# Patient Record
Sex: Male | Born: 1965 | Race: Black or African American | Hispanic: No | Marital: Single | Smoking: Former smoker
Health system: Southern US, Community
[De-identification: ages and names within clinical notes are randomized; demographics above are authoritative.]

## PROBLEM LIST (undated history)

## (undated) DIAGNOSIS — E119 Type 2 diabetes mellitus without complications: Secondary | ICD-10-CM

## (undated) DIAGNOSIS — N189 Chronic kidney disease, unspecified: Secondary | ICD-10-CM

## (undated) DIAGNOSIS — E785 Hyperlipidemia, unspecified: Secondary | ICD-10-CM

## (undated) DIAGNOSIS — N186 End stage renal disease: Secondary | ICD-10-CM

## (undated) HISTORY — DX: Chronic kidney disease, unspecified: N18.9

## (undated) HISTORY — DX: Hyperlipidemia, unspecified: E78.5

## (undated) HISTORY — DX: Type 2 diabetes mellitus without complications: E11.9

## (undated) HISTORY — DX: End stage renal disease: N18.6

---

## 2018-07-26 DIAGNOSIS — I480 Paroxysmal atrial fibrillation: Secondary | ICD-10-CM | POA: Insufficient documentation

## 2018-07-26 DIAGNOSIS — I1310 Hypertensive heart and chronic kidney disease without heart failure, with stage 1 through stage 4 chronic kidney disease, or unspecified chronic kidney disease: Secondary | ICD-10-CM | POA: Insufficient documentation

## 2018-07-26 DIAGNOSIS — E782 Mixed hyperlipidemia: Secondary | ICD-10-CM | POA: Insufficient documentation

## 2018-07-26 DIAGNOSIS — I5032 Chronic diastolic (congestive) heart failure: Secondary | ICD-10-CM | POA: Insufficient documentation

## 2018-07-30 DIAGNOSIS — N184 Chronic kidney disease, stage 4 (severe): Secondary | ICD-10-CM | POA: Insufficient documentation

## 2018-07-30 DIAGNOSIS — I517 Cardiomegaly: Secondary | ICD-10-CM | POA: Insufficient documentation

## 2018-07-30 DIAGNOSIS — N289 Disorder of kidney and ureter, unspecified: Secondary | ICD-10-CM | POA: Insufficient documentation

## 2018-07-30 DIAGNOSIS — I10 Essential (primary) hypertension: Secondary | ICD-10-CM | POA: Insufficient documentation

## 2018-07-30 DIAGNOSIS — R809 Proteinuria, unspecified: Secondary | ICD-10-CM | POA: Insufficient documentation

## 2018-07-30 DIAGNOSIS — I38 Endocarditis, valve unspecified: Secondary | ICD-10-CM | POA: Insufficient documentation

## 2018-09-11 DIAGNOSIS — B182 Chronic viral hepatitis C: Secondary | ICD-10-CM | POA: Insufficient documentation

## 2018-09-11 DIAGNOSIS — R768 Other specified abnormal immunological findings in serum: Secondary | ICD-10-CM | POA: Insufficient documentation

## 2019-02-03 DIAGNOSIS — E1129 Type 2 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: Secondary | ICD-10-CM | POA: Insufficient documentation

## 2019-02-03 DIAGNOSIS — N186 End stage renal disease: Secondary | ICD-10-CM | POA: Insufficient documentation

## 2019-02-03 DIAGNOSIS — T7840XA Allergy, unspecified, initial encounter: Secondary | ICD-10-CM | POA: Insufficient documentation

## 2019-02-03 DIAGNOSIS — N2581 Secondary hyperparathyroidism of renal origin: Secondary | ICD-10-CM | POA: Insufficient documentation

## 2019-02-03 DIAGNOSIS — D689 Coagulation defect, unspecified: Secondary | ICD-10-CM | POA: Insufficient documentation

## 2019-02-15 DIAGNOSIS — R52 Pain, unspecified: Secondary | ICD-10-CM | POA: Insufficient documentation

## 2019-03-25 DIAGNOSIS — R197 Diarrhea, unspecified: Secondary | ICD-10-CM | POA: Insufficient documentation

## 2019-04-07 ENCOUNTER — Other Ambulatory Visit: Payer: Self-pay

## 2019-04-09 ENCOUNTER — Other Ambulatory Visit: Payer: Self-pay

## 2019-04-09 ENCOUNTER — Ambulatory Visit (INDEPENDENT_AMBULATORY_CARE_PROVIDER_SITE_OTHER): Payer: Medicare Other | Admitting: Endocrinology

## 2019-04-09 ENCOUNTER — Encounter: Payer: Self-pay | Admitting: Endocrinology

## 2019-04-09 VITALS — BP 110/70 | HR 88 | Ht 69.0 in | Wt 252.0 lb

## 2019-04-09 DIAGNOSIS — E119 Type 2 diabetes mellitus without complications: Secondary | ICD-10-CM | POA: Diagnosis not present

## 2019-04-09 DIAGNOSIS — Z794 Long term (current) use of insulin: Secondary | ICD-10-CM

## 2019-04-09 DIAGNOSIS — N186 End stage renal disease: Secondary | ICD-10-CM

## 2019-04-09 DIAGNOSIS — E1122 Type 2 diabetes mellitus with diabetic chronic kidney disease: Secondary | ICD-10-CM | POA: Diagnosis not present

## 2019-04-09 DIAGNOSIS — Z992 Dependence on renal dialysis: Secondary | ICD-10-CM

## 2019-04-09 DIAGNOSIS — Z89429 Acquired absence of other toe(s), unspecified side: Secondary | ICD-10-CM

## 2019-04-09 DIAGNOSIS — E785 Hyperlipidemia, unspecified: Secondary | ICD-10-CM

## 2019-04-09 LAB — POCT GLYCOSYLATED HEMOGLOBIN (HGB A1C): Hemoglobin A1C: 8.8 % — AB (ref 4.0–5.6)

## 2019-04-09 MED ORDER — FREESTYLE LIBRE 14 DAY SENSOR MISC: 1.0000 | 3 refills | Status: DC

## 2019-04-09 MED ORDER — BASAGLAR KWIKPEN 100 UNIT/ML ~~LOC~~ SOPN: 60.0000 [IU] | PEN_INJECTOR | Freq: Every day | SUBCUTANEOUS | 3 refills | Status: DC

## 2019-04-09 MED ORDER — INSULIN LISPRO (1 UNIT DIAL) 100 UNIT/ML (KWIKPEN): 90.0000 [IU] | PEN_INJECTOR | Freq: Two times a day (BID) | SUBCUTANEOUS | 3 refills | Status: DC

## 2019-04-09 MED ORDER — PEN NEEDLES 31G X 8 MM MISC: 1.0000 | Freq: Four times a day (QID) | 3 refills | Status: DC

## 2019-04-09 NOTE — Patient Instructions (Addendum)
good diet and exercise significantly improve the control of your diabetes.  please let me know if you wish to be referred to a dietician.  high blood sugar is very risky to your health.  you should see an eye doctor and dentist every year.  It is very important to get all recommended vaccinations.  Controlling your blood pressure and cholesterol drastically reduces the damage diabetes does to your body.  Those who smoke should quit.  Please discuss these with your doctor.  check your blood sugar twice a day.  vary the time of day when you check, between before the 3 meals, and at bedtime.  also check if you have symptoms of your blood sugar being too high or too low.  please keep a record of the readings and bring it to your next appointment here (or you can bring the meter itself).  You can write it on any piece of paper.  please call us sooner if your blood sugar goes below 70, or if you have a lot of readings over 200. Please change the insulins to the numbers listed below (you would not take the humalog at bedtime). Please come back for a follow-up appointment in 2 weeks.

## 2019-04-09 NOTE — Progress Notes (Signed)
Subjective:    Patient ID: Darryl Kelley, male    DOB: 05/10/1965, 54 y.o.   MRN: [id number]  HPI pt is referred by Dr Deterding, for diabetes.  Pt states DM was dx'ed in 1660; it is complicated by ESRD, toe amputation, and PN; he has been on insulin since 2006; pt says his diet and exercise are poor; he has never had pancreatitis, pancreatic surgery, or DKA.  Last episode of severe hypoglycemia was in 2015.  He eats 2 meals per day (10 AM and 7 PM).  He seldom has hypoglycemia, and these episodes are mild.  We downloaded continuous glucose monitor.  Glucose varies from 70-320.  It is in general lowest fasting and in the afternoon.  He takes humalog 60 units twice a day (just before each meal, and at HS), and basaglar 60/d.   Past Medical History:  Diagnosis Date  . Diabetes ([place])   . Dyslipidemia      Social History   Socioeconomic History  . Marital status: Single    Spouse name: Not on file  . Number of children: Not on file  . Years of education: Not on file  . Highest education level: Not on file  Occupational History  . Not on file  Tobacco Use  . Smoking status: Former Research scientist (life sciences)  . Smokeless tobacco: Never Used  Substance and Sexual Activity  . Alcohol use: Never  . Drug use: Never  . Sexual activity: Not Currently  Other Topics Concern  . Not on file  Social History Narrative  . Not on file   Social Determinants of Health   Financial Resource Strain:   . Difficulty of Paying Living Expenses:   Food Insecurity:   . Worried About Charity fundraiser in the Last Year:   . Arboriculturist in the Last Year:   Transportation Needs:   . Film/video editor (Medical):   Marland Kitchen Lack of Transportation (Non-Medical):   Physical Activity:   . Days of Exercise per Week:   . Minutes of Exercise per Session:   Stress:   . Feeling of Stress :   Social Connections:   . Frequency of Communication with Friends and Family:   . Frequency of Social Gatherings with Friends and  Family:   . Attends Religious Services:   . Active Member of Clubs or Organizations:   . Attends Archivist Meetings:   Marland Kitchen Marital Status:   Intimate Partner Violence:   . Fear of Current or Ex-Partner:   . Emotionally Abused:   Marland Kitchen Physically Abused:   . Sexually Abused:     Current Outpatient Medications on File Prior to Visit  Medication Sig Dispense Refill  . acetaminophen (TYLENOL) 650 MG CR tablet Take 650 mg by mouth as needed for pain.    Marland Kitchen aspirin 81 MG EC tablet Take 81 mg by mouth daily.     . carvedilol (COREG) 25 MG tablet Take 12.5 mg by mouth 2 (two) times daily with a meal.     . clopidogrel (PLAVIX) 75 MG tablet Take 75 mg by mouth daily.     Marland Kitchen doxycycline (VIBRA-TABS) 100 MG tablet Take 100 mg by mouth 2 (two) times daily.     Marland Kitchen ezetimibe (ZETIA) 10 MG tablet Take 10 mg by mouth daily.     . heparin 1000 unit/mL SOLN injection Heparin Sodium (Porcine) 1,000 Units/mL Systemic    . LOPERAMIDE HCL PO Take 2 mg by mouth as needed.     Marland Kitchen  metoCLOPramide (REGLAN) 5 MG tablet Take 5 mg by mouth 3 (three) times daily as needed.     Marland Kitchen omeprazole (PRILOSEC) 20 MG capsule Take 20 mg by mouth daily.     . pregabalin (LYRICA) 150 MG capsule Take 150 mg by mouth 2 (two) times daily.     . rosuvastatin (CRESTOR) 40 MG tablet Take 40 mg by mouth daily.     . sevelamer carbonate (RENVELA) 800 MG tablet Take 1,600 mg by mouth 3 (three) times daily with meals.     Marland Kitchen VITAMIN D PO Take 0.75 mcg by mouth 3 (three) times a week.      No current facility-administered medications on file prior to visit.    Allergies  Allergen Reactions  . Atorvastatin Hives    Family History  Problem Relation Age of Onset  . Diabetes Neg Hx     BP 110/70   Pulse 88   Ht 5\' 9"  (1.753 m)   Wt 252 lb (114.3 kg)   SpO2 96%   BMI 37.21 kg/m    Review of Systems denies blurry vision, chest pain, n/v, memory loss, and depression.  He has fatigue, doe, and weight gain.      Objective:    Physical Exam VS: see vs page GEN: no distress HEAD: head: no deformity eyes: no periorbital swelling, no proptosis external nose and ears are normal NECK: supple, thyroid is not enlarged CHEST WALL: no deformity LUNGS: clear to auscultation CV: reg rate and rhythm, no murmur MUSCULOSKELETAL: muscle bulk and strength are grossly normal.  no obvious joint swelling.  gait is normal and steady.  Left 3rd toe is surgically absent.    EXTEMITIES: no deformity.  no ulcer on the feet.  feet are of normal color and temp.  trace bilat leg edema.   PULSES: dorsalis pedis intact bilat.  no carotid bruit NEURO:  cn 2-12 grossly intact.   readily moves all 4's.  sensation is intact to touch on the feet, but decreased from normal.   SKIN:  Normal texture and temperature.  No rash or suspicious lesion is visible.   NODES:  None palpable at the neck PSYCH: alert, well-oriented.  Does not appear anxious nor depressed.   Lab Results  Component Value Date   HGBA1C 8.8 (A) 04/09/2019   I have reviewed outside records, and summarized: Pt was noted to have elevated a1c, and referred here.  It was noted that pt was stable on HD.  He goes MWF afternoons     Assessment & Plan:  Insulin-requiring type 2 DM, on HD. He needs increased rx. The pattern of his cbg's indicates he needs some adjustment in his therapy    Patient Instructions  good diet and exercise significantly improve the control of your diabetes.  please let me know if you wish to be referred to a dietician.  high blood sugar is very risky to your health.  you should see an eye doctor and dentist every year.  It is very important to get all recommended vaccinations.  Controlling your blood pressure and cholesterol drastically reduces the damage diabetes does to your body.  Those who smoke should quit.  Please discuss these with your doctor.  check your blood sugar twice a day.  vary the time of day when you check, between before the 3 meals, and  at bedtime.  also check if you have symptoms of your blood sugar being too high or too low.  please keep a record  of the readings and bring it to your next appointment here (or you can bring the meter itself).  You can write it on any piece of paper.  please call us sooner if your blood sugar goes below 70, or if you have a lot of readings over 200. Please change the insulins to the numbers listed below (you would not take the humalog at bedtime). Please come back for a follow-up appointment in 2 weeks.

## 2019-04-11 ENCOUNTER — Ambulatory Visit: Payer: Medicare Other | Admitting: Family Medicine

## 2019-04-12 ENCOUNTER — Encounter: Payer: Self-pay | Admitting: Endocrinology

## 2019-04-12 DIAGNOSIS — E785 Hyperlipidemia, unspecified: Secondary | ICD-10-CM | POA: Insufficient documentation

## 2019-04-16 ENCOUNTER — Other Ambulatory Visit: Payer: Self-pay

## 2019-04-16 ENCOUNTER — Encounter: Payer: Self-pay | Admitting: Family Medicine

## 2019-04-16 ENCOUNTER — Ambulatory Visit (INDEPENDENT_AMBULATORY_CARE_PROVIDER_SITE_OTHER): Payer: Medicare Other | Admitting: Family Medicine

## 2019-04-16 VITALS — BP 112/60 | HR 81 | Wt 251.0 lb

## 2019-04-16 DIAGNOSIS — R61 Generalized hyperhidrosis: Secondary | ICD-10-CM

## 2019-04-16 DIAGNOSIS — E785 Hyperlipidemia, unspecified: Secondary | ICD-10-CM

## 2019-04-16 DIAGNOSIS — Z7689 Persons encountering health services in other specified circumstances: Secondary | ICD-10-CM | POA: Diagnosis not present

## 2019-04-16 DIAGNOSIS — E1129 Type 2 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: Secondary | ICD-10-CM | POA: Diagnosis not present

## 2019-04-16 MED ORDER — BASAGLAR KWIKPEN 100 UNIT/ML ~~LOC~~ SOPN: 60.0000 [IU] | PEN_INJECTOR | Freq: Every day | SUBCUTANEOUS | 3 refills | Status: DC

## 2019-04-16 MED ORDER — PREGABALIN 150 MG PO CAPS: 150.0000 mg | ORAL_CAPSULE | Freq: Two times a day (BID) | ORAL | 2 refills | Status: DC

## 2019-04-16 MED ORDER — AMLODIPINE BESYLATE 5 MG PO TABS: 5.0000 mg | ORAL_TABLET | Freq: Every day | ORAL | 2 refills | Status: DC

## 2019-04-16 MED ORDER — INSULIN LISPRO (1 UNIT DIAL) 100 UNIT/ML (KWIKPEN): 90.0000 [IU] | PEN_INJECTOR | Freq: Two times a day (BID) | SUBCUTANEOUS | 3 refills | Status: DC

## 2019-04-16 MED ORDER — FLUTICASONE PROPIONATE 50 MCG/ACT NA SUSP: 2.0000 | Freq: Every day | NASAL | 6 refills | Status: AC

## 2019-04-16 NOTE — Assessment & Plan Note (Signed)
Patient reports that for the past few weeks he has had issues with waking up with sweat on his back.  Says that it is between his back and the mattress.  Reports that he just moved here and that he sleeps on an air mattress but that the sheet between his back and the air mattress will be drenched in sweat.  Denies any sweat on his forehead or chest.  Reports that he has never taken his temperature.  This is most likely related to moisture forming between the patient's back in the rubber air mattress.  Recommended putting another barrier between back and air mattress. -Follow-up with [place] at next visit

## 2019-04-16 NOTE — Progress Notes (Signed)
    SUBJECTIVE:   CHIEF COMPLAINT / HPI:  Patient presents to establish care  Current concerns  Night sweats  Has been going on for a couple of weeks. Wakes up in the middle of the night and is sweaty. Has not taken tem. 10 pound weight gain over the last year. Reports the sweating in only between body and matress.   Neuropathy.  Need refill on lyrica 150 mg BID.  Past medical history  Chronic pain-2013 Lyrica Diabetes-1980s. Lantus, basaglar,. Scheduled endocrinologist in one week. hemoglobin A1c on 04/09/2019 was 8.8. Hypertension-1980s, Kidney issues-2020-patient is ESRD since June 2020, Tuesday Thursday Saturday dialysis patient Liver problems or hepatitis-1980s Hep C.  Hyperlipidemia  Past surgical history Dialysis acces in 2020. Left third toe amputation 2021  Family history Grandparents with hypertension and  Grandmother with stroke in late 21s.   Allergies Atorvastatin-hives  Social history Patient lives alone.  Has Muslim religious believes.  Has an advanced directive making his ex-wife his medical decision maker.  He does not exercise regularly.  Denies tobacco use.  Denies recreational drug use.  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 Denies EtOH.  Health maintenance Patient reports that he has not had a colonoscopy although he is scheduled for one but never got it.  OBJECTIVE:   BP 112/60   Pulse 81   Wt 251 lb (113.9 kg)   SpO2 99%   BMI 37.07 kg/m   General: NAD, resting comfortably on exam table. HEENT: Atraumatic. Normocephalic. Normal oropharynx without erythema, lesions, exudate.  Neck: No cervical lymphadenopathy.  Cardiac: RRR, no m/r/g  Respiratory: CTAB, normal work of breathing Abdomen: soft, nontender, nondistended, bowel sounds normal Skin: warm and dry, no rashes noted Neuro: alert and oriented  MSK: Missing third digit of the left foot  ASSESSMENT/PLAN:   Encounter to establish care with new doctor Patient here to establish  care.  Past medical history, surgical history, medications, allergies, social history reviewed. -Medications refilled -Lipid panel today -CMP today -Ambulatory referral to ophthalmology for diabetic retinopathy screening -Ambulatory referral to gastroenterology for colonoscopy scheduling  Type 2 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 [facility]) Patient reports history of type 2 diabetes which she is seeing an endocrinologist for.  Hemoglobin A1c on 4/7 was 8.8. -Patient is scheduled to follow-up with endocrinologist next week -Continue on current medication regimen at this time and follow endocrinology's recommendations   Night sweats Patient reports that for the past few weeks he has had issues with waking up with sweat on his back.  Says that it is between his back and the mattress.  Reports that he just moved here and that he sleeps on an air mattress but that the sheet between his back and the air mattress will be drenched in sweat.  Denies any sweat on his forehead or chest.  Reports that he has never taken his temperature.  This is most likely related to moisture forming between the patient's back in the rubber air mattress.  Recommended putting another barrier between back and air mattress. -Follow-up with [place] at next visit  Dyslipidemia Patient currently on Zetia and rosuvastatin 40 mg.  Lipid panel today. -Decrease Crestor to 10 mg daily.   Gifford Shave, MD [place]

## 2019-04-16 NOTE — Patient Instructions (Addendum)
It was wonderful to meet you today.  I sent prescriptions in for the refills that you requested that if there are other medications you need refills on please asked the pharmacy to send me those requests.  I also put a referral in for you to see an ophthalmologist.  I am going to collect some lab work today including a lipid panel to look at your cholesterol along with a metabolic panel to look at your liver function and electrolytes.  Below is some information regarding dietary changes recommended for diabetics.  If you have any questions concerns or needed follow-up visit please feel free to call the clinic and schedule an appointment.  I would like to see you back in 3 months for a diabetic follow-up visit.  Have a wonderful day!   Diabetes Mellitus and Nutrition, Adult When you have diabetes (diabetes mellitus), it is very important to have healthy eating habits because your blood sugar (glucose) levels are greatly affected by what you eat and drink. Eating healthy foods in the appropriate amounts, at about the same times every day, can help you:  Control your blood glucose.  Lower your risk of heart disease.  Improve your blood pressure.  Reach or maintain a healthy weight. Every person with diabetes is different, and each person has different needs for a meal plan. Your health care provider may recommend that you work with a diet and nutrition specialist (dietitian) to make a meal plan that is best for you. Your meal plan may vary depending on factors such as:  The calories you need.  The medicines you take.  Your weight.  Your blood glucose, blood pressure, and cholesterol levels.  Your activity level.  Other health conditions you have, such as heart or kidney disease. How do carbohydrates affect me? Carbohydrates, also called carbs, affect your blood glucose level more than any other type of food. Eating carbs naturally raises the amount of glucose in your blood. Carb counting is a  method for keeping track of how many carbs you eat. Counting carbs is important to keep your blood glucose at a healthy level, especially if you use insulin or take certain oral diabetes medicines. It is important to know how many carbs you can safely have in each meal. This is different for every person. Your dietitian can help you calculate how many carbs you should have at each meal and for each snack. Foods that contain carbs include:  Bread, cereal, rice, pasta, and crackers.  Potatoes and corn.  Peas, beans, and lentils.  Milk and yogurt.  Fruit and juice.  Desserts, such as cakes, cookies, ice cream, and candy. How does alcohol affect me? Alcohol can cause a sudden decrease in blood glucose (hypoglycemia), especially if you use insulin or take certain oral diabetes medicines. Hypoglycemia can be a life-threatening condition. Symptoms of hypoglycemia (sleepiness, dizziness, and confusion) are similar to symptoms of having too much alcohol. If your health care provider says that alcohol is safe for you, follow these guidelines:  Limit alcohol intake to no more than 1 drink per day for nonpregnant women and 2 drinks per day for men. One drink equals 12 oz of beer, 5 oz of wine, or 1 oz of hard liquor.  Do not drink on an empty stomach.  Keep yourself hydrated with water, diet soda, or unsweetened iced tea.  Keep in mind that regular soda, juice, and other mixers may contain a lot of sugar and must be counted as carbs. What  are tips for following this plan?  Reading food labels  Start by checking the serving size on the "Nutrition Facts" label of packaged foods and drinks. The amount of calories, carbs, fats, and other nutrients listed on the label is based on one serving of the item. Many items contain more than one serving per package.  Check the total grams (g) of carbs in one serving. You can calculate the number of servings of carbs in one serving by dividing the total carbs  by 15. For example, if a food has 30 g of total carbs, it would be equal to 2 servings of carbs.  Check the number of grams (g) of saturated and trans fats in one serving. Choose foods that have low or no amount of these fats.  Check the number of milligrams (mg) of salt (sodium) in one serving. Most people should limit total sodium intake to less than 2,300 mg per day.  Always check the nutrition information of foods labeled as "low-fat" or "nonfat". These foods may be higher in added sugar or refined carbs and should be avoided.  Talk to your dietitian to identify your daily goals for nutrients listed on the label. Shopping  Avoid buying canned, premade, or processed foods. These foods tend to be high in fat, sodium, and added sugar.  Shop around the outside edge of the grocery store. This includes fresh fruits and vegetables, bulk grains, fresh meats, and fresh dairy. Cooking  Use low-heat cooking methods, such as baking, instead of high-heat cooking methods like deep frying.  Cook using healthy oils, such as olive, canola, or sunflower oil.  Avoid cooking with butter, cream, or high-fat meats. Meal planning  Eat meals and snacks regularly, preferably at the same times every day. Avoid going long periods of time without eating.  Eat foods high in fiber, such as fresh fruits, vegetables, beans, and whole grains. Talk to your dietitian about how many servings of carbs you can eat at each meal.  Eat 4-6 ounces (oz) of lean protein each day, such as lean meat, chicken, fish, eggs, or tofu. One oz of lean protein is equal to: ? 1 oz of meat, chicken, or fish. ? 1 egg. ?  cup of tofu.  Eat some foods each day that contain healthy fats, such as avocado, nuts, seeds, and fish. Lifestyle  Check your blood glucose regularly.  Exercise regularly as told by your health care provider. This may include: ? 150 minutes of moderate-intensity or vigorous-intensity exercise each week. This  could be brisk walking, biking, or water aerobics. ? Stretching and doing strength exercises, such as yoga or weightlifting, at least 2 times a week.  Take medicines as told by your health care provider.  Do not use any products that contain nicotine or tobacco, such as cigarettes and e-cigarettes. If you need help quitting, ask your health care provider.  Work with a Social worker or diabetes educator to identify strategies to manage stress and any emotional and social challenges. Questions to ask a health care provider  Do I need to meet with a diabetes educator?  Do I need to meet with a dietitian?  What number can I call if I have questions?  When are the best times to check my blood glucose? Where to find more information:  American Diabetes Association: diabetes.org  Academy of Nutrition and Dietetics: www.eatright.[employer] of Diabetes and Digestive and Kidney Diseases (NIH): [employer] Summary  A healthy meal plan will help you control  your blood glucose and maintain a healthy lifestyle.  Working with a diet and nutrition specialist (dietitian) can help you make a meal plan that is best for you.  Keep in mind that carbohydrates (carbs) and alcohol have immediate effects on your blood glucose levels. It is important to count carbs and to use alcohol carefully. This information is not intended to replace advice given to you by your health care provider. Make sure you discuss any questions you have with your health care provider. Document Revised: 12/01/2016 Document Reviewed: 01/24/2016 Elsevier Patient Education  2020 [employer].

## 2019-04-16 NOTE — Assessment & Plan Note (Addendum)
Patient here to establish care.  Past medical history, surgical history, medications, allergies, social history reviewed. -Medications refilled -Lipid panel today -CMP today -Ambulatory referral to ophthalmology for diabetic retinopathy screening -Ambulatory referral to gastroenterology for colonoscopy scheduling

## 2019-04-16 NOTE — Assessment & Plan Note (Signed)
Patient reports history of type 2 diabetes which she is seeing an endocrinologist for.  Hemoglobin A1c on 4/7 was 8.8. -Patient is scheduled to follow-up with endocrinologist next week -Continue on current medication regimen at this time and follow endocrinology's recommendations

## 2019-04-17 ENCOUNTER — Encounter: Payer: Self-pay | Admitting: Internal Medicine

## 2019-04-17 LAB — COMPREHENSIVE METABOLIC PANEL
ALT: 28 IU/L (ref 0–44)
AST: 35 IU/L (ref 0–40)
Albumin/Globulin Ratio: 1.2 (ref 1.2–2.2)
Albumin: 4.2 g/dL (ref 3.8–4.9)
Alkaline Phosphatase: 118 IU/L — ABNORMAL HIGH (ref 39–117)
BUN/Creatinine Ratio: 4 — ABNORMAL LOW (ref 9–20)
BUN: 38 mg/dL — ABNORMAL HIGH (ref 6–24)
Bilirubin Total: 0.6 mg/dL (ref 0.0–1.2)
CO2: 27 mmol/L (ref 20–29)
Calcium: 9.2 mg/dL (ref 8.7–10.2)
Chloride: 92 mmol/L — ABNORMAL LOW (ref 96–106)
Creatinine, Ser: 9.62 mg/dL — ABNORMAL HIGH (ref 0.76–1.27)
GFR calc Af Amer: 6 mL/min/{1.73_m2} — ABNORMAL LOW (ref >59)
GFR calc non Af Amer: 6 mL/min/{1.73_m2} — ABNORMAL LOW (ref >59)
Globulin, Total: 3.6 g/dL (ref 1.5–4.5)
Glucose: 136 mg/dL — ABNORMAL HIGH (ref 65–99)
Potassium: 4.7 mmol/L (ref 3.5–5.2)
Sodium: 140 mmol/L (ref 134–144)
Total Protein: 7.8 g/dL (ref 6.0–8.5)

## 2019-04-17 LAB — LIPID PANEL
Chol/HDL Ratio: 4.8 ratio (ref 0.0–5.0)
Cholesterol, Total: 81 mg/dL — ABNORMAL LOW (ref 100–199)
HDL: 17 mg/dL — ABNORMAL LOW (ref >39)
LDL Chol Calc (NIH): 21 mg/dL (ref 0–99)
Triglycerides: 294 mg/dL — ABNORMAL HIGH (ref 0–149)
VLDL Cholesterol Cal: 43 mg/dL — ABNORMAL HIGH (ref 5–40)

## 2019-04-17 MED ORDER — ROSUVASTATIN CALCIUM 10 MG PO TABS: 10.0000 mg | ORAL_TABLET | Freq: Every day | ORAL | 3 refills | Status: DC

## 2019-04-17 NOTE — Assessment & Plan Note (Signed)
Patient currently on Zetia and rosuvastatin 40 mg.  Lipid panel today. -Decrease Crestor to 10 mg daily.

## 2019-04-21 ENCOUNTER — Ambulatory Visit (INDEPENDENT_AMBULATORY_CARE_PROVIDER_SITE_OTHER): Payer: Medicare Other | Admitting: Ophthalmology

## 2019-04-21 ENCOUNTER — Other Ambulatory Visit: Payer: Self-pay

## 2019-04-21 ENCOUNTER — Encounter (INDEPENDENT_AMBULATORY_CARE_PROVIDER_SITE_OTHER): Payer: Self-pay | Admitting: Ophthalmology

## 2019-04-21 DIAGNOSIS — E113493 Type 2 diabetes mellitus with severe nonproliferative diabetic retinopathy without macular edema, bilateral: Secondary | ICD-10-CM | POA: Diagnosis not present

## 2019-04-21 DIAGNOSIS — H2513 Age-related nuclear cataract, bilateral: Secondary | ICD-10-CM | POA: Diagnosis not present

## 2019-04-21 NOTE — Progress Notes (Signed)
04/21/2019     CHIEF COMPLAINT Patient presents for Retina Evaluation   HISTORY OF PRESENT ILLNESS: Darryl Kelley is a 54 y.o. male who presents to the clinic today for:   HPI    Retina Evaluation    In both eyes.  Duration of 1 year.  Associated Symptoms Negative for Flashes and Floaters.  Context:  distance vision.  Treatments tried include no treatments.          Comments    Patient states he has noticed for the past year, when he goes outside without his glasses on, he has a hard time seeing. Patient states when he has his glasses on, his vision is not blurry. Denies FOL and floaters. LBS 194 this AM A1C 10 April 2019       Last edited by Gerda Diss on 04/21/2019  9:32 AM. (History)      Referring physician: Renato Shin, MD [address],  [place] [address]  HISTORICAL INFORMATION:   Selected notes from the MEDICAL RECORD NUMBER    Lab Results  Component Value Date   HGBA1C 8.8 (A) 04/09/2019     CURRENT MEDICATIONS: No current outpatient medications on file. (Ophthalmic Drugs)   No current facility-administered medications for this visit. (Ophthalmic Drugs)   Current Outpatient Medications (Other)  Medication Sig  . acetaminophen (TYLENOL) 650 MG CR tablet Take 650 mg by mouth as needed for pain.  Marland Kitchen amLODipine (NORVASC) 5 MG tablet Take 1 tablet (5 mg total) by mouth daily.  Marland Kitchen aspirin 81 MG EC tablet Take 81 mg by mouth daily.   . carvedilol (COREG) 25 MG tablet Take 12.5 mg by mouth 2 (two) times daily with a meal.   . clopidogrel (PLAVIX) 75 MG tablet Take 75 mg by mouth daily.   . Continuous Blood Gluc Sensor (FREESTYLE LIBRE 14 DAY SENSOR) MISC 1 each by Does not apply route every 14 (fourteen) days. For use with continuous glucose monitoring system. Change every 14 days; E11.9  . doxycycline (VIBRA-TABS) 100 MG tablet Take 100 mg by mouth 2 (two) times daily.   Marland Kitchen ezetimibe (ZETIA) 10 MG tablet Take 10 mg by mouth daily.     . fluticasone (FLONASE) 50 MCG/ACT nasal spray Place 2 sprays into both nostrils daily.  . heparin 1000 unit/mL SOLN injection Heparin Sodium (Porcine) 1,000 Units/mL Systemic  . Insulin Glargine (BASAGLAR KWIKPEN) 100 UNIT/ML Inject 0.6 mLs (60 Units total) into the skin at bedtime.  . insulin lispro (HUMALOG) 100 UNIT/ML KwikPen Inject 0.9 mLs (90 Units total) into the skin 2 (two) times daily with a meal.  . Insulin Pen Needle (PEN NEEDLES) 31G X 8 MM MISC 1 each by Does not apply route 4 (four) times daily.  Marland Kitchen LOPERAMIDE HCL PO Take 2 mg by mouth as needed.   . metoCLOPramide (REGLAN) 5 MG tablet Take 5 mg by mouth 3 (three) times daily as needed.   Marland Kitchen omeprazole (PRILOSEC) 20 MG capsule Take 20 mg by mouth daily.   . pregabalin (LYRICA) 150 MG capsule Take 1 capsule (150 mg total) by mouth 2 (two) times daily.  . rosuvastatin (CRESTOR) 10 MG tablet Take 1 tablet (10 mg total) by mouth daily.  . sevelamer carbonate (RENVELA) 800 MG tablet Take 1,600 mg by mouth 3 (three) times daily with meals.   Marland Kitchen VITAMIN D PO Take 0.75 mcg by mouth 3 (three) times a week.    No current facility-administered medications for this visit. (  Other)      REVIEW OF SYSTEMS:    ALLERGIES Allergies  Allergen Reactions  . Atorvastatin Hives    PAST MEDICAL HISTORY Past Medical History:  Diagnosis Date  . Diabetes ([place])   . Dyslipidemia    History reviewed. No pertinent surgical history.  FAMILY HISTORY Family History  Problem Relation Age of Onset  . Diabetes Neg Hx     SOCIAL HISTORY Social History   Tobacco Use  . Smoking status: Former Research scientist (life sciences)  . Smokeless tobacco: Never Used  Substance Use Topics  . Alcohol use: Never  . Drug use: Never         OPHTHALMIC EXAM: Base Eye Exam    Visual Acuity (Snellen - Linear)      Right Left   Dist cc 20/20-2 20/20-1   Correction: Glasses       Tonometry (Tonopen, 9:40 AM)      Right Left   Pressure 13 12       Pupils      Pupils  Dark Light Shape React APD   Right PERRL 3 3 Round Minimal None   Left PERRL 3 3 Round Minimal None       Visual Fields (Counting fingers)      Left Right    Full Full       Extraocular Movement      Right Left    Full Full       Neuro/Psych    Oriented x3: Yes   Mood/Affect: Normal       Dilation    Both eyes: 1.0% Mydriacyl, 2.5% Phenylephrine @ 9:40 AM        Slit Lamp and Fundus Exam    External Exam      Right Left   External Normal Normal       Slit Lamp Exam      Right Left   Lids/Lashes Normal Normal   Conjunctiva/Sclera White and quiet White and quiet   Cornea Clear Clear   Anterior Chamber Deep and quiet Deep and quiet   Iris Round and reactive Round and reactive   Vitreous Normal Normal          IMAGING AND PROCEDURES  Imaging and Procedures for 04/21/19           ASSESSMENT/PLAN:  No problem-specific Assessment & Plan notes found for this encounter.    No diagnosis found.  1.  2.  3.  Ophthalmic Meds Ordered this visit:  No orders of the defined types were placed in this encounter.      No follow-ups on file.  There are no Patient Instructions on file for this visit.   Explained the diagnoses, plan, and follow up with the patient and they expressed understanding.  Patient expressed understanding of the importance of proper follow up care.   Clent Demark Dyllen Menning M.D. Diseases & Surgery of the Retina and Vitreous Retina & Diabetic [place] 04/21/19     Abbreviations: M myopia (nearsighted); A astigmatism; H hyperopia (farsighted); P presbyopia; Mrx spectacle prescription;  CTL contact lenses; OD right eye; OS left eye; OU both eyes  XT exotropia; ET esotropia; PEK punctate epithelial keratitis; PEE punctate epithelial erosions; DES dry eye syndrome; MGD meibomian gland dysfunction; ATs artificial tears; PFAT's preservative free artificial tears; Scott nuclear sclerotic cataract; PSC posterior subcapsular cataract; ERM  epi-retinal membrane; PVD posterior vitreous detachment; RD retinal detachment; DM diabetes mellitus; DR diabetic retinopathy; NPDR non-proliferative diabetic retinopathy; PDR proliferative diabetic retinopathy; CSME clinically  significant macular edema; DME diabetic macular edema; dbh dot blot hemorrhages; CWS cotton wool spot; POAG primary open angle glaucoma; C/D cup-to-disc ratio; HVF humphrey visual field; GVF goldmann visual field; OCT optical coherence tomography; IOP intraocular pressure; BRVO Branch retinal vein occlusion; CRVO central retinal vein occlusion; CRAO central retinal artery occlusion; BRAO branch retinal artery occlusion; RT retinal tear; SB scleral buckle; PPV pars plana vitrectomy; VH Vitreous hemorrhage; PRP panretinal laser photocoagulation; IVK intravitreal kenalog; VMT vitreomacular traction; MH Macular hole;  NVD neovascularization of the disc; NVE neovascularization elsewhere; AREDS age related eye disease study; ARMD age related macular degeneration; POAG primary open angle glaucoma; EBMD epithelial/anterior basement membrane dystrophy; ACIOL anterior chamber intraocular lens; IOL intraocular lens; PCIOL posterior chamber intraocular lens; Phaco/IOL phacoemulsification with intraocular lens placement; [place] photorefractive keratectomy; LASIK laser assisted in situ keratomileusis; HTN hypertension; DM diabetes mellitus; COPD chronic obstructive pulmonary disease

## 2019-04-21 NOTE — Assessment & Plan Note (Signed)

## 2019-04-21 NOTE — Assessment & Plan Note (Signed)
The nature of severe nonproliferative diabetic retinopathy discussed with the patient as well as the need for more frequent follow up and likely progression to proliferative disease in the near future. The options of continued observation versus panretinal photocoagulation at this time were reviewed as well as the risks, benefits, and alternatives. More recent option includes the use of ocular injectable medications to slow progression of retinal disease. Tight control of glucose, blood pressure, and serum lipid levels were recommended under the direction of general physician or endocrinologist, as well as avoidance of smoking and maintenance of normal body weight. The 2-year risk of progression to proliferative diabetic retinopathy is 60%.

## 2019-04-21 NOTE — Patient Instructions (Signed)
Diabetic Retinopathy Diabetic retinopathy is a disease of the retina. The retina is a light-sensitive membrane at the back of the eye. Retinopathy is a complication of diabetes (diabetes mellitus) and a common cause of bad eyesight (visual impairment). It can eventually cause blindness. Early detection and treatment of diabetic retinopathy is important in keeping your eyes healthy and preventing further damage to them. What are the causes? Diabetic retinopathy is caused by blood sugar (glucose) levels that are too high for an extended period of time. High blood glucose over an extended period of time can:  Damage small blood vessels in the retina, allowing blood to leak through the vessel walls.  Cause new, abnormal blood vessels to grow on the retina. This can scar the retina in the advanced stage of diabetic retinopathy. What increases the risk? You are more likely to develop this condition if:  You have had diabetes for a long time.  You have poorly controlled blood glucose.  You have high blood pressure. What are the signs or symptoms? In the early stages of diabetic retinopathy, there are often no symptoms. As the condition gets worse, symptoms may include:  Blurred vision. This is usually caused by swelling due to abnormal blood glucose levels. The blurriness may go away when blood glucose levels return to normal.  Moving specks or dark spots (floaters) in your vision. These can be caused by a small amount of bleeding (hemorrhage) from retinal blood vessels.  Missing parts of your field of vision, such as vision at the sides of the eyes. This can be caused by larger retinal hemorrhages.  Difficulty reading.  Double vision.  Pain in one or both eyes.  Feeling pressure in one or both eyes.  Trouble seeing straight lines. Straight lines may not look straight.  Redness of the eyes that does not go away. How is this diagnosed? This condition may be diagnosed with an eye exam in  which your eye care specialist puts drops in your eyes that enlarge (dilate) your pupils. This lets your health care provider examine your retina and check for changes in your retinal blood vessels. How is this treated? This condition may be treated by:  Keeping your blood glucose and blood pressure within a target range.  Using a type of laser beam to seal your retinal blood vessels. This stops them from bleeding and decreases pressure in your eye.  Getting shots of medicine in the eye to reduce swelling of the center of the retina (macula). You may be given: ? Anti-VEGF medicine. This medicine can help slow vision loss, and may even improve vision. ? Steroid medicine. Follow these instructions at home:   Follow your diabetes management plan as directed by your health care provider. This may include exercising regularly and eating a healthy diet.  Keep your blood glucose level and your blood pressure in your target range, as directed by your health care provider.  Check your blood glucose as often as directed.  Take over the counter and prescription medicines only as told by your health care provider. This includes insulin and oral diabetes medicine.  Get your eyes checked at least once every year. An eye specialist can usually see diabetic retinopathy developing long before it starts to cause problems. In many cases, it can be treated to prevent complications from occurring.  Do not use any products that contain nicotine or tobacco, such as cigarettes and e-cigarettes. If you need help quitting, ask your health care provider.  Keep all follow-up   visits as told by your health care provider. This is important. Contact a health care provider if:  You notice gradual blurring or other changes in your vision over time.  You notice that your glasses or contact lenses do not make things look as sharp as they once did.  You have trouble reading or seeing details at a distance with either  eye.  You notice a change in your vision or notice that parts of your field of vision appear missing or hazy.  You suddenly see moving specks or dark spots in the field of vision of either eye. Get help right away if:  You have sudden pain or pressure in one or both eyes.  You suddenly lose vision or a curtain or veil seems to come across your eyes.  You have a sudden burst of floaters in your vision. Summary  Diabetic retinopathy is a disease of the retina. The retina is a light-sensitive membrane at the back of the eye. Retinopathy is a complication of diabetes.  Get your eyes checked at least once every year. An eye specialist can usually see diabetic retinopathy developing long before it starts to cause problems. In many cases, it can be treated to prevent complications from occurring.  Keep your blood glucose and your blood pressure in target range. Follow your diabetes management plan as directed by your health care provider.  Protect your eyes. Wear sunglasses and eye protection when needed. This information is not intended to replace advice given to you by your health care provider. Make sure you discuss any questions you have with your health care provider. Document Revised: 01/31/2017 Document Reviewed: 01/24/2016 Elsevier Patient Education  2020 Elsevier Inc.  

## 2019-04-23 ENCOUNTER — Ambulatory Visit (INDEPENDENT_AMBULATORY_CARE_PROVIDER_SITE_OTHER): Payer: Medicare Other | Admitting: Endocrinology

## 2019-04-23 ENCOUNTER — Encounter: Payer: Self-pay | Admitting: Endocrinology

## 2019-04-23 ENCOUNTER — Other Ambulatory Visit: Payer: Self-pay

## 2019-04-23 VITALS — BP 120/80 | HR 86 | Ht 69.0 in | Wt 253.6 lb

## 2019-04-23 DIAGNOSIS — E1129 Type 2 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: Secondary | ICD-10-CM

## 2019-04-23 DIAGNOSIS — E119 Type 2 diabetes mellitus without complications: Secondary | ICD-10-CM | POA: Diagnosis not present

## 2019-04-23 MED ORDER — FREESTYLE LIBRE 14 DAY SENSOR MISC: 1.0000 | 3 refills | Status: DC

## 2019-04-23 MED ORDER — INSULIN LISPRO (1 UNIT DIAL) 100 UNIT/ML (KWIKPEN): PEN_INJECTOR | SUBCUTANEOUS | 3 refills | Status: DC

## 2019-04-23 NOTE — Patient Instructions (Addendum)
check your blood sugar twice a day.  vary the time of day when you check, between before the 3 meals, and at bedtime.  also check if you have symptoms of your blood sugar being too high or too low.  please keep a record of the readings and bring it to your next appointment here (or you can bring the meter itself).  You can write it on any piece of paper.  please call us sooner if your blood sugar goes below 70, or if you have a lot of readings over 200. Please change the Humalog to 60 units with breakfast, and 90 units with supper.  Please come back for a follow-up appointment in 3 weeks.

## 2019-04-23 NOTE — Progress Notes (Signed)
Subjective:    Patient ID: Darryl Kelley, male    DOB: 02/03/1965, 54 y.o.   MRN: [id number]  HPI Pt returns for f/u of diabetes mellitus: DM type: Insulin-requiring type 2 Dx'ed: 5852 Complications: ESRD, toe amputation, and PN Therapy: insulin since 2006 DKA: never Severe hypoglycemia: last episode of was in 2015 Pancreatitis: never Pancreatic imaging: never SDOH: none Other: He eats 2 meals per day (9 AM and 6 PM); he takes multiple daily injections Interval history: He takes humalog 70 units BID, and basaglar, 60 units qd.  Pt says he seldom misses the insulin.  I reviewed continuous glucose monitor data.  Glucose varies from 100-400.  It is in general highest at HS, and lowest in the afternoon.   Past Medical History:  Diagnosis Date  . Diabetes ([place])   . Dyslipidemia     No past surgical history on file.  Social History   Socioeconomic History  . Marital status: Single    Spouse name: Not on file  . Number of children: Not on file  . Years of education: Not on file  . Highest education level: Not on file  Occupational History  . Not on file  Tobacco Use  . Smoking status: Former Research scientist (life sciences)  . Smokeless tobacco: Never Used  Substance and Sexual Activity  . Alcohol use: Never  . Drug use: Never  . Sexual activity: Not Currently  Other Topics Concern  . Not on file  Social History Narrative  . Not on file   Social Determinants of Health   Financial Resource Strain:   . Difficulty of Paying Living Expenses:   Food Insecurity:   . Worried About Charity fundraiser in the Last Year:   . Arboriculturist in the Last Year:   Transportation Needs:   . Film/video editor (Medical):   Marland Kitchen Lack of Transportation (Non-Medical):   Physical Activity:   . Days of Exercise per Week:   . Minutes of Exercise per Session:   Stress:   . Feeling of Stress :   Social Connections:   . Frequency of Communication with Friends and Family:   . Frequency of Social  Gatherings with Friends and Family:   . Attends Religious Services:   . Active Member of Clubs or Organizations:   . Attends Archivist Meetings:   Marland Kitchen Marital Status:   Intimate Partner Violence:   . Fear of Current or Ex-Partner:   . Emotionally Abused:   Marland Kitchen Physically Abused:   . Sexually Abused:     Current Outpatient Medications on File Prior to Visit  Medication Sig Dispense Refill  . acetaminophen (TYLENOL) 650 MG CR tablet Take 650 mg by mouth as needed for pain.    Marland Kitchen amLODipine (NORVASC) 5 MG tablet Take 1 tablet (5 mg total) by mouth daily. 90 tablet 2  . aspirin 81 MG EC tablet Take 81 mg by mouth daily.     . carvedilol (COREG) 25 MG tablet Take 12.5 mg by mouth 2 (two) times daily with a meal.     . clopidogrel (PLAVIX) 75 MG tablet Take 75 mg by mouth daily.     Marland Kitchen doxycycline (VIBRA-TABS) 100 MG tablet Take 100 mg by mouth 2 (two) times daily.     Marland Kitchen ezetimibe (ZETIA) 10 MG tablet Take 10 mg by mouth daily.     . fluticasone (FLONASE) 50 MCG/ACT nasal spray Place 2 sprays into both nostrils daily. 16 g 6  .  heparin 1000 unit/mL SOLN injection Heparin Sodium (Porcine) 1,000 Units/mL Systemic    . Insulin Glargine (BASAGLAR KWIKPEN) 100 UNIT/ML Inject 0.6 mLs (60 Units total) into the skin at bedtime. 25 pen 3  . Insulin Pen Needle (PEN NEEDLES) 31G X 8 MM MISC 1 each by Does not apply route 4 (four) times daily. 360 each 3  . LOPERAMIDE HCL PO Take 2 mg by mouth as needed.     . metoCLOPramide (REGLAN) 5 MG tablet Take 5 mg by mouth 3 (three) times daily as needed.     Marland Kitchen omeprazole (PRILOSEC) 20 MG capsule Take 20 mg by mouth daily.     . pregabalin (LYRICA) 150 MG capsule Take 1 capsule (150 mg total) by mouth 2 (two) times daily. 180 capsule 2  . rosuvastatin (CRESTOR) 10 MG tablet Take 1 tablet (10 mg total) by mouth daily. 90 tablet 3  . sevelamer carbonate (RENVELA) 800 MG tablet Take 1,600 mg by mouth 3 (three) times daily with meals.     Marland Kitchen VITAMIN D PO Take  0.75 mcg by mouth 3 (three) times a week.      No current facility-administered medications on file prior to visit.    Allergies  Allergen Reactions  . Atorvastatin Hives    Family History  Problem Relation Age of Onset  . Diabetes Neg Hx     BP 120/80   Pulse 86   Ht 5\' 9"  (1.753 m)   Wt 253 lb 9.6 oz (115 kg)   SpO2 93%   BMI 37.45 kg/m   Review of Systems He denies hypoglycemia    Objective:   Physical Exam VITAL SIGNS:  See vs page GENERAL: no distress Pulses: dorsalis pedis intact bilat.   MSK: no deformity of the feet CV: trace bilat leg edema Skin:  no ulcer on the feet.  normal color and temp on the feet. Neuro: sensation is intact to touch on the feet, but decreased from normal Ext: there is bilateral onychomycosis of the toenails      Assessment & Plan:  Insulin-requiring type 2 DM, with ESRD: The pattern of his cbg's indicates he needs some adjustment in his therapy   Patient Instructions  check your blood sugar twice a day.  vary the time of day when you check, between before the 3 meals, and at bedtime.  also check if you have symptoms of your blood sugar being too high or too low.  please keep a record of the readings and bring it to your next appointment here (or you can bring the meter itself).  You can write it on any piece of paper.  please call us sooner if your blood sugar goes below 70, or if you have a lot of readings over 200. Please change the Humalog to 60 units with breakfast, and 90 units with supper.  Please come back for a follow-up appointment in 3 weeks.

## 2019-04-28 ENCOUNTER — Other Ambulatory Visit: Payer: Self-pay | Admitting: *Deleted

## 2019-04-28 MED ORDER — METOCLOPRAMIDE HCL 5 MG PO TABS: 5.0000 mg | ORAL_TABLET | Freq: Four times a day (QID) | ORAL | 2 refills | Status: DC

## 2019-04-28 NOTE — Telephone Encounter (Signed)
The directions are different than the one in [facility]  Rx request [id number] 1 Tab by mouth 4 times a day before meals and at bedtime. Orlandis Sanden Zimmerman Rumple, CMA

## 2019-04-29 ENCOUNTER — Encounter (INDEPENDENT_AMBULATORY_CARE_PROVIDER_SITE_OTHER): Payer: Medicare Other | Admitting: Ophthalmology

## 2019-04-30 ENCOUNTER — Encounter (INDEPENDENT_AMBULATORY_CARE_PROVIDER_SITE_OTHER): Payer: Medicare Other | Admitting: Ophthalmology

## 2019-05-03 DIAGNOSIS — E1129 Type 2 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: Secondary | ICD-10-CM | POA: Diagnosis not present

## 2019-05-03 DIAGNOSIS — T7840XA Allergy, unspecified, initial encounter: Secondary | ICD-10-CM | POA: Diagnosis not present

## 2019-05-03 DIAGNOSIS — Z992 Dependence on renal dialysis: Secondary | ICD-10-CM | POA: Diagnosis not present

## 2019-05-03 DIAGNOSIS — D689 Coagulation defect, unspecified: Secondary | ICD-10-CM | POA: Diagnosis not present

## 2019-05-03 DIAGNOSIS — N2581 Secondary hyperparathyroidism of renal origin: Secondary | ICD-10-CM | POA: Diagnosis not present

## 2019-05-03 DIAGNOSIS — N186 End stage renal disease: Secondary | ICD-10-CM | POA: Diagnosis not present

## 2019-05-05 ENCOUNTER — Other Ambulatory Visit: Payer: Self-pay

## 2019-05-05 ENCOUNTER — Ambulatory Visit (INDEPENDENT_AMBULATORY_CARE_PROVIDER_SITE_OTHER): Payer: Medicare HMO | Admitting: Ophthalmology

## 2019-05-05 ENCOUNTER — Encounter (INDEPENDENT_AMBULATORY_CARE_PROVIDER_SITE_OTHER): Payer: Self-pay | Admitting: Ophthalmology

## 2019-05-05 ENCOUNTER — Encounter: Payer: Medicare HMO | Attending: Endocrinology | Admitting: Skilled Nursing Facility1

## 2019-05-05 DIAGNOSIS — E113493 Type 2 diabetes mellitus with severe nonproliferative diabetic retinopathy without macular edema, bilateral: Secondary | ICD-10-CM

## 2019-05-05 DIAGNOSIS — E113592 Type 2 diabetes mellitus with proliferative diabetic retinopathy without macular edema, left eye: Secondary | ICD-10-CM | POA: Diagnosis not present

## 2019-05-05 DIAGNOSIS — E113591 Type 2 diabetes mellitus with proliferative diabetic retinopathy without macular edema, right eye: Secondary | ICD-10-CM | POA: Diagnosis not present

## 2019-05-05 MED ORDER — FLUORESCEIN SODIUM 10 % IV SOLN
500.0000 mg | INTRAVENOUS | Status: AC | PRN
  Administered 2019-05-05: 500 mg via INTRAVENOUS

## 2019-05-05 NOTE — Patient Instructions (Signed)
Diabetic Retinopathy Diabetic retinopathy is a disease of the retina. The retina is a light-sensitive membrane at the back of the eye. Retinopathy is a complication of diabetes (diabetes mellitus) and a common cause of bad eyesight (visual impairment). It can eventually cause blindness. Early detection and treatment of diabetic retinopathy is important in keeping your eyes healthy and preventing further damage to them. What are the causes? Diabetic retinopathy is caused by blood sugar (glucose) levels that are too high for an extended period of time. High blood glucose over an extended period of time can:  Damage small blood vessels in the retina, allowing blood to leak through the vessel walls.  Cause new, abnormal blood vessels to grow on the retina. This can scar the retina in the advanced stage of diabetic retinopathy. What increases the risk? You are more likely to develop this condition if:  You have had diabetes for a long time.  You have poorly controlled blood glucose.  You have high blood pressure. What are the signs or symptoms? In the early stages of diabetic retinopathy, there are often no symptoms. As the condition gets worse, symptoms may include:  Blurred vision. This is usually caused by swelling due to abnormal blood glucose levels. The blurriness may go away when blood glucose levels return to normal.  Moving specks or dark spots (floaters) in your vision. These can be caused by a small amount of bleeding (hemorrhage) from retinal blood vessels.  Missing parts of your field of vision, such as vision at the sides of the eyes. This can be caused by larger retinal hemorrhages.  Difficulty reading.  Double vision.  Pain in one or both eyes.  Feeling pressure in one or both eyes.  Trouble seeing straight lines. Straight lines may not look straight.  Redness of the eyes that does not go away. How is this diagnosed? This condition may be diagnosed with an eye exam in  which your eye care specialist puts drops in your eyes that enlarge (dilate) your pupils. This lets your health care provider examine your retina and check for changes in your retinal blood vessels. How is this treated? This condition may be treated by:  Keeping your blood glucose and blood pressure within a target range.  Using a type of laser beam to seal your retinal blood vessels. This stops them from bleeding and decreases pressure in your eye.  Getting shots of medicine in the eye to reduce swelling of the center of the retina (macula). You may be given: ? Anti-VEGF medicine. This medicine can help slow vision loss, and may even improve vision. ? Steroid medicine. Follow these instructions at home:   Follow your diabetes management plan as directed by your health care provider. This may include exercising regularly and eating a healthy diet.  Keep your blood glucose level and your blood pressure in your target range, as directed by your health care provider.  Check your blood glucose as often as directed.  Take over the counter and prescription medicines only as told by your health care provider. This includes insulin and oral diabetes medicine.  Get your eyes checked at least once every year. An eye specialist can usually see diabetic retinopathy developing long before it starts to cause problems. In many cases, it can be treated to prevent complications from occurring.  Do not use any products that contain nicotine or tobacco, such as cigarettes and e-cigarettes. If you need help quitting, ask your health care provider.  Keep all follow-up   visits as told by your health care provider. This is important. Contact a health care provider if:  You notice gradual blurring or other changes in your vision over time.  You notice that your glasses or contact lenses do not make things look as sharp as they once did.  You have trouble reading or seeing details at a distance with either  eye.  You notice a change in your vision or notice that parts of your field of vision appear missing or hazy.  You suddenly see moving specks or dark spots in the field of vision of either eye. Get help right away if:  You have sudden pain or pressure in one or both eyes.  You suddenly lose vision or a curtain or veil seems to come across your eyes.  You have a sudden burst of floaters in your vision. Summary  Diabetic retinopathy is a disease of the retina. The retina is a light-sensitive membrane at the back of the eye. Retinopathy is a complication of diabetes.  Get your eyes checked at least once every year. An eye specialist can usually see diabetic retinopathy developing long before it starts to cause problems. In many cases, it can be treated to prevent complications from occurring.  Keep your blood glucose and your blood pressure in target range. Follow your diabetes management plan as directed by your health care provider.  Protect your eyes. Wear sunglasses and eye protection when needed. This information is not intended to replace advice given to you by your health care provider. Make sure you discuss any questions you have with your health care provider. Document Revised: 01/31/2017 Document Reviewed: 01/24/2016 Elsevier Patient Education  2020 Elsevier Inc.  

## 2019-05-05 NOTE — Progress Notes (Signed)
05/05/2019     CHIEF COMPLAINT Patient presents for Diabetic Eye Exam   HISTORY OF PRESENT ILLNESS: Darryl Kelley is a 54 y.o. male who presents to the clinic today for:   HPI    Diabetic Eye Exam    Vision is stable.  Associated Symptoms Negative for Flashes and Floaters.  Diabetes characteristics include Type 2.  Blood sugar level is controlled.  Last Blood Glucose 209.  Associated Diagnosis Dialysis.  I, the attending physician,  performed the HPI with the patient and updated documentation appropriately.          Comments    2 Week Diabetic Exam OU. FP. FFA L\R  Pt states no changes or issues since last visit.  BGL: 209 currently       Last edited by Tilda Franco on 05/05/2019  9:42 AM. (History)      Referring physician: Gifford Shave, MD [address] [place],  [place] [address]  HISTORICAL INFORMATION:   Selected notes from the MEDICAL RECORD NUMBER    Lab Results  Component Value Date   HGBA1C 8.8 (A) 04/09/2019     CURRENT MEDICATIONS: No current outpatient medications on file. (Ophthalmic Drugs)   No current facility-administered medications for this visit. (Ophthalmic Drugs)   Current Outpatient Medications (Other)  Medication Sig  . acetaminophen (TYLENOL) 650 MG CR tablet Take 650 mg by mouth as needed for pain.  Marland Kitchen amLODipine (NORVASC) 5 MG tablet Take 1 tablet (5 mg total) by mouth daily.  Marland Kitchen aspirin 81 MG EC tablet Take 81 mg by mouth daily.   . carvedilol (COREG) 25 MG tablet Take 12.5 mg by mouth 2 (two) times daily with a meal.   . clopidogrel (PLAVIX) 75 MG tablet Take 75 mg by mouth daily.   . Continuous Blood Gluc Sensor (FREESTYLE LIBRE 14 DAY SENSOR) MISC 1 each by Does not apply route every 14 (fourteen) days. For use with continuous glucose monitoring system. Change every 14 days; E11.9  . doxycycline (VIBRA-TABS) 100 MG tablet Take 100 mg by mouth 2 (two) times daily.   Marland Kitchen ezetimibe (ZETIA) 10 MG tablet Take 10 mg by mouth  daily.   . fluticasone (FLONASE) 50 MCG/ACT nasal spray Place 2 sprays into both nostrils daily.  . heparin 1000 unit/mL SOLN injection Heparin Sodium (Porcine) 1,000 Units/mL Systemic  . Insulin Glargine (BASAGLAR KWIKPEN) 100 UNIT/ML Inject 0.6 mLs (60 Units total) into the skin at bedtime.  . insulin lispro (HUMALOG) 100 UNIT/ML KwikPen 60 units with breakfast, and 90 units with supper.  . Insulin Pen Needle (PEN NEEDLES) 31G X 8 MM MISC 1 each by Does not apply route 4 (four) times daily.  Marland Kitchen LOPERAMIDE HCL PO Take 2 mg by mouth as needed.   . metoCLOPramide (REGLAN) 5 MG tablet Take 1 tablet (5 mg total) by mouth in the morning, at noon, in the evening, and at bedtime.  Marland Kitchen omeprazole (PRILOSEC) 20 MG capsule Take 20 mg by mouth daily.   . pregabalin (LYRICA) 150 MG capsule Take 1 capsule (150 mg total) by mouth 2 (two) times daily.  . rosuvastatin (CRESTOR) 10 MG tablet Take 1 tablet (10 mg total) by mouth daily.  . sevelamer carbonate (RENVELA) 800 MG tablet Take 1,600 mg by mouth 3 (three) times daily with meals.   Marland Kitchen VITAMIN D PO Take 0.75 mcg by mouth 3 (three) times a week.    No current facility-administered medications for this visit. (Other)  REVIEW OF SYSTEMS: ROS    Positive for: Endocrine   Last edited by Tilda Franco on 05/05/2019  9:34 AM. (History)       ALLERGIES Allergies  Allergen Reactions  . Atorvastatin Hives    PAST MEDICAL HISTORY Past Medical History:  Diagnosis Date  . Diabetes ([place])   . Dyslipidemia    History reviewed. No pertinent surgical history.  FAMILY HISTORY Family History  Problem Relation Age of Onset  . Diabetes Neg Hx     SOCIAL HISTORY Social History   Tobacco Use  . Smoking status: Former Research scientist (life sciences)  . Smokeless tobacco: Never Used  Substance Use Topics  . Alcohol use: Never  . Drug use: Never         OPHTHALMIC EXAM:  Base Eye Exam    Visual Acuity (Snellen - Linear)      Right Left   Dist cc 20/20 -1  20/20 -2       Tonometry (Tonopen, 9:40 AM)      Right Left   Pressure 10 12       Pupils      Pupils Dark Light Shape React APD   Right PERRL 3 3 Round Minimal None   Left PERRL 3 3 Round Minimal None       Visual Fields (Counting fingers)      Left Right    Full Full       Neuro/Psych    Oriented x3: Yes   Mood/Affect: Normal       Dilation    Both eyes: 1.0% Mydriacyl, 2.5% Phenylephrine @ 9:40 AM        Slit Lamp and Fundus Exam    External Exam      Right Left   External Normal Normal       Slit Lamp Exam      Right Left   Lids/Lashes Normal Normal   Conjunctiva/Sclera White and quiet White and quiet   Cornea Clear Clear   Anterior Chamber Deep and quiet Deep and quiet   Iris Round and reactive Round and reactive   Anterior Vitreous Normal Normal       Fundus Exam      Right Left   Posterior Vitreous Normal Normal   Disc Normal Normal   C/D Ratio 0.2 0.2   Macula Microaneurysms, no clinically significant macular edema, no macular thickening Microaneurysms, no clinically significant macular edema, no macular thickening   Vessels NPDR- Moderate NPDR- Moderate   Periphery Normal Normal          IMAGING AND PROCEDURES  Imaging and Procedures for 05/05/19  Color Fundus Photography Optos - OU - Both Eyes       Right Eye Progression has no prior data. Disc findings include normal observations. Macula : microaneurysms. Vessels : Neovascularization. Periphery : neovascularization.   Left Eye Progression has no prior data. Macula : microaneurysms. Vessels : Neovascularization. Periphery : neovascularization.   Notes Retinal neovascularization noted,,, there is currently no vitreous hemorrhage.       Fluorescein Angiography Optos (Transit OS)       Injection:  500 mg Fluorescein Sodium 10 % injection   NDC: 11941-740-81   Route: IntravenousRight Eye   Progression has worsened. Early phase findings include retinal neovascularization.  Mid/Late phase findings include neovascularization disc. Choroidal neovascularization is not present.   Left Eye   Progression has worsened. Early phase findings include leakage, retinal neovascularization. Mid/Late phase findings include retinal neovascularization. Choroidal neovascularization is not present.  Notes Fluorescein angiography was reviewed for the patient and the severe nonproliferative disease compounded by extensive peripheral retinal nonperfusion and early neovascularization elsewhere superotemporal arcade in the left eye and inferotemporal in the right eye were reviewed in detail.                ASSESSMENT/PLAN:  No problem-specific Assessment & Plan notes found for this encounter.      ICD-10-CM   1. 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 Proliferative diabetic retinopathy of right eye without macular edema associated with type 2 diabetes mellitus ([place])  E11.3591   3. Proliferative diabetic retinopathy of left eye without macular edema associated with type 2 diabetes mellitus ([place])  T51.7616     1.  I discussed the urgent need for injection of antiveg F medications left eye and then right eye.  This would plan to be followed by panretinal photocoagulation to minimize his long-term treatment burden  2.  Patient chooses to discuss with his family members  3.  Make appoint for for injection left eye  Ophthalmic Meds Ordered this visit:  Meds ordered this encounter  Medications  . Fluorescein Sodium 10 % injection 500 mg       Return in about 1 week (around 05/12/2019) for AVASTIN OCT, OS, NO DILATE.  Patient Instructions  Diabetic Retinopathy Diabetic retinopathy is a disease of the retina. The retina is a  light-sensitive membrane at the back of the eye. Retinopathy is a complication of diabetes (diabetes mellitus) and a common cause of bad eyesight (visual impairment). It can eventually cause blindness. Early detection and treatment of diabetic retinopathy is important in keeping your eyes healthy and preventing further damage to them. What are the causes? Diabetic retinopathy is caused by blood sugar (glucose) levels that are too high for an extended period of time. High blood glucose over an extended period of time can:  Damage small blood vessels in the retina, allowing blood to leak through the vessel walls.  Cause new, abnormal blood vessels to grow on the retina. This can scar the retina in the advanced stage of diabetic retinopathy. What increases the risk? You are more likely to develop this condition if:  You have had diabetes for a long time.  You have poorly controlled blood glucose.  You have high blood pressure. What are the signs or symptoms? In the early stages of diabetic retinopathy, there are often no symptoms. As the condition gets worse, symptoms may include:  Blurred vision. This is usually caused by swelling due to abnormal blood glucose levels. The blurriness may go away when blood glucose levels return to normal.  Moving specks or dark spots (floaters) in your vision. These can be caused by a small amount of bleeding (hemorrhage) from retinal blood vessels.  Missing parts of your field of vision, such as vision at the sides of the eyes. This can be caused by larger retinal hemorrhages.  Difficulty reading.  Double vision.  Pain in one or both eyes.  Feeling pressure in one or both eyes.  Trouble seeing straight lines. Straight lines may not look straight.  Redness of the eyes that does not go away. How is this diagnosed? This condition may be diagnosed with an eye exam in which your  eye care specialist puts drops in your eyes that enlarge (dilate) your  pupils. This lets your health care provider examine your retina and check for changes in your retinal blood vessels. How is this treated? This condition may be treated by:  Keeping your blood glucose and blood pressure within a target range.  Using a type of laser beam to seal your retinal blood vessels. This stops them from bleeding and decreases pressure in your eye.  Getting shots of medicine in the eye to reduce swelling of the center of the retina (macula). You may be given: ? Anti-VEGF medicine. This medicine can help slow vision loss, and may even improve vision. ? Steroid medicine. Follow these instructions at home:   Follow your diabetes management plan as directed by your health care provider. This may include exercising regularly and eating a healthy diet.  Keep your blood glucose level and your blood pressure in your target range, as directed by your health care provider.  Check your blood glucose as often as directed.  Take over the counter and prescription medicines only as told by your health care provider. This includes insulin and oral diabetes medicine.  Get your eyes checked at least once every year. An eye specialist can usually see diabetic retinopathy developing long before it starts to cause problems. In many cases, it can be treated to prevent complications from occurring.  Do not use any products that contain nicotine or tobacco, such as cigarettes and e-cigarettes. If you need help quitting, ask your health care provider.  Keep all follow-up visits as told by your health care provider. This is important. Contact a health care provider if:  You notice gradual blurring or other changes in your vision over time.  You notice that your glasses or contact lenses do not make things look as sharp as they once did.  You have trouble reading or seeing details at a distance with either eye.  You notice a change in your vision or notice that parts of your field of  vision appear missing or hazy.  You suddenly see moving specks or dark spots in the field of vision of either eye. Get help right away if:  You have sudden pain or pressure in one or both eyes.  You suddenly lose vision or a curtain or veil seems to come across your eyes.  You have a sudden burst of floaters in your vision. Summary  Diabetic retinopathy is a disease of the retina. The retina is a light-sensitive membrane at the back of the eye. Retinopathy is a complication of diabetes.  Get your eyes checked at least once every year. An eye specialist can usually see diabetic retinopathy developing long before it starts to cause problems. In many cases, it can be treated to prevent complications from occurring.  Keep your blood glucose and your blood pressure in target range. Follow your diabetes management plan as directed by your health care provider.  Protect your eyes. Wear sunglasses and eye protection when needed. This information is not intended to replace advice given to you by your health care provider. Make sure you discuss any questions you have with your health care provider. Document Revised: 01/31/2017 Document Reviewed: 01/24/2016 Elsevier Patient Education  2020 [employer].     Explained the diagnoses, plan, and follow up with the patient and they expressed understanding.  Patient expressed understanding of the importance of proper follow up care.   Clent Demark Evant Paone M.D. Diseases & Surgery of the Retina  and Vitreous Retina & Diabetic [place] 05/05/19     Abbreviations: M myopia (nearsighted); A astigmatism; H hyperopia (farsighted); P presbyopia; Mrx spectacle prescription;  CTL contact lenses; OD right eye; OS left eye; OU both eyes  XT exotropia; ET esotropia; PEK punctate epithelial keratitis; PEE punctate epithelial erosions; DES dry eye syndrome; MGD meibomian gland dysfunction; ATs artificial tears; PFAT's preservative free artificial tears; [place] nuclear  sclerotic cataract; PSC posterior subcapsular cataract; ERM epi-retinal membrane; PVD posterior vitreous detachment; RD retinal detachment; DM diabetes mellitus; DR diabetic retinopathy; NPDR non-proliferative diabetic retinopathy; PDR proliferative diabetic retinopathy; CSME clinically significant macular edema; DME diabetic macular edema; dbh dot blot hemorrhages; CWS cotton wool spot; POAG primary open angle glaucoma; C/D cup-to-disc ratio; HVF humphrey visual field; GVF goldmann visual field; OCT optical coherence tomography; IOP intraocular pressure; BRVO Branch retinal vein occlusion; CRVO central retinal vein occlusion; CRAO central retinal artery occlusion; BRAO branch retinal artery occlusion; RT retinal tear; SB scleral buckle; PPV pars plana vitrectomy; VH Vitreous hemorrhage; PRP panretinal laser photocoagulation; IVK intravitreal kenalog; VMT vitreomacular traction; MH Macular hole;  NVD neovascularization of the disc; NVE neovascularization elsewhere; AREDS age related eye disease study; ARMD age related macular degeneration; POAG primary open angle glaucoma; EBMD epithelial/anterior basement membrane dystrophy; ACIOL anterior chamber intraocular lens; IOL intraocular lens; PCIOL posterior chamber intraocular lens; Phaco/IOL phacoemulsification with intraocular lens placement; [place] photorefractive keratectomy; LASIK laser assisted in situ keratomileusis; HTN hypertension; DM diabetes mellitus; COPD chronic obstructive pulmonary disease

## 2019-05-12 ENCOUNTER — Encounter (INDEPENDENT_AMBULATORY_CARE_PROVIDER_SITE_OTHER): Payer: Medicare HMO | Admitting: Ophthalmology

## 2019-05-12 ENCOUNTER — Encounter (INDEPENDENT_AMBULATORY_CARE_PROVIDER_SITE_OTHER): Payer: Self-pay

## 2019-05-14 ENCOUNTER — Ambulatory Visit: Payer: Medicare Other | Admitting: Endocrinology

## 2019-05-15 ENCOUNTER — Telehealth: Payer: Self-pay

## 2019-05-15 NOTE — Telephone Encounter (Signed)
FAXED [place]: Rx for CGM [employer] Other records requested: None requested  All above requested information has been faxed successfully to [employer] listed above. Documents and fax confirmation have been placed in the faxed file for future reference.

## 2019-05-16 DIAGNOSIS — E119 Type 2 diabetes mellitus without complications: Secondary | ICD-10-CM | POA: Diagnosis not present

## 2019-05-21 ENCOUNTER — Ambulatory Visit: Payer: Medicare Other | Admitting: Internal Medicine

## 2019-05-26 ENCOUNTER — Ambulatory Visit: Payer: Medicare HMO | Admitting: Endocrinology

## 2019-05-28 ENCOUNTER — Ambulatory Visit: Payer: Medicare HMO | Admitting: Endocrinology

## 2019-05-29 DIAGNOSIS — D689 Coagulation defect, unspecified: Secondary | ICD-10-CM | POA: Diagnosis not present

## 2019-05-29 DIAGNOSIS — N186 End stage renal disease: Secondary | ICD-10-CM | POA: Diagnosis not present

## 2019-05-29 DIAGNOSIS — E1129 Type 2 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: Secondary | ICD-10-CM | POA: Diagnosis not present

## 2019-05-29 DIAGNOSIS — T7840XA Allergy, unspecified, initial encounter: Secondary | ICD-10-CM | POA: Diagnosis not present

## 2019-05-29 DIAGNOSIS — N2581 Secondary hyperparathyroidism of renal origin: Secondary | ICD-10-CM | POA: Diagnosis not present

## 2019-05-29 DIAGNOSIS — Z992 Dependence on renal dialysis: Secondary | ICD-10-CM | POA: Diagnosis not present

## 2019-06-02 DIAGNOSIS — N186 End stage renal disease: Secondary | ICD-10-CM | POA: Diagnosis not present

## 2019-06-02 DIAGNOSIS — I150 Renovascular hypertension: Secondary | ICD-10-CM | POA: Diagnosis not present

## 2019-06-02 DIAGNOSIS — Z992 Dependence on renal dialysis: Secondary | ICD-10-CM | POA: Diagnosis not present

## 2019-06-03 ENCOUNTER — Other Ambulatory Visit: Payer: Self-pay | Admitting: Family Medicine

## 2019-06-03 DIAGNOSIS — N186 End stage renal disease: Secondary | ICD-10-CM | POA: Diagnosis not present

## 2019-06-03 DIAGNOSIS — E1129 Type 2 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: Secondary | ICD-10-CM | POA: Diagnosis not present

## 2019-06-03 DIAGNOSIS — D689 Coagulation defect, unspecified: Secondary | ICD-10-CM | POA: Diagnosis not present

## 2019-06-03 DIAGNOSIS — Z992 Dependence on renal dialysis: Secondary | ICD-10-CM | POA: Diagnosis not present

## 2019-06-03 DIAGNOSIS — N2581 Secondary hyperparathyroidism of renal origin: Secondary | ICD-10-CM | POA: Diagnosis not present

## 2019-06-04 DIAGNOSIS — E162 Hypoglycemia, unspecified: Secondary | ICD-10-CM | POA: Insufficient documentation

## 2019-06-09 ENCOUNTER — Encounter: Payer: Self-pay | Admitting: Skilled Nursing Facility1

## 2019-06-09 ENCOUNTER — Encounter: Payer: Medicare HMO | Attending: Endocrinology | Admitting: Skilled Nursing Facility1

## 2019-06-09 ENCOUNTER — Other Ambulatory Visit: Payer: Self-pay

## 2019-06-09 DIAGNOSIS — E119 Type 2 diabetes mellitus without complications: Secondary | ICD-10-CM | POA: Diagnosis not present

## 2019-06-09 DIAGNOSIS — N186 End stage renal disease: Secondary | ICD-10-CM

## 2019-06-09 NOTE — Progress Notes (Signed)
Pts priority was education on what to eat having ESRD on dialysis.  Tuesday, Thursday, Saturday dialysis Toe amputation Pt states he has a lot of questions. Pt states ever since getting on dialysis it has changed his whole diet and he is very confused and frustrated. A1C 8.8. Pt states he does take his phosphorus binder because he has had high phosphorus.  Pt states since taking 90 units at bedtime his blood sugars have been: pt states his blood sugars have. Pt states he takes his Humalog about 15-30 minutes before breakfast but sometimes forgets to take it and sometimes takes it after breakfast. Pt states he sometimes forgets to take his Humalog before dinner. Pt states he sometimes takes the glargine and humalog at the same time stating he sometimes forgets to take that Glargine as well. Pt states he takes 80 units of Glargine. Pt states his last report card from dialysis was all WNL. Pt states he is on a 32 ounce fluid restriction. Pt states after dialysis he has cramps and feels tired stating he has to prepare his food the day before due to lack of energy. Pt states sometimes he cannot chew because the back of his necks hurts. Pt states he lives alone. Pt states he does not follow the fluid recommendation. Pt states he eats 4 eggs a day. Pt states he usually only eats 2 meals a day. Pt states brown rice is nasty. Pt states he forgets to eat more often. Pt states he cannot stay at limited fluids due to thirst. Pt states he does add salt to his foods. Pt states he does struggle with more appropriate portion sizes. Pt states he just moved here from [place].  Pt states he just likes to sit at the [facility] not necessarily being with people to avoid drama and loves to just sit at the park but does not know where to go.   Handouts: MyPlate for those with dialysis List of [place] parks with addresses [facility] website for recipes   Goals: Let Dr. Loanne Drilling know your blood sugars have been down  in the 50's at around 3-4 am (3 occurences) as well as your blood sugars hitting the 400's  Try edamame in the frozen food aisle de shelled  Aim to eat every 3 hours  Aim for 30 grams of protein per meal Try biotene dry mouth spray Bring chicken salad or tuna salad or egg salad when you are out On label for bread: whole wheat bread  Try soy milk Take your insulin as advised by Dr. Loanne Drilling EVERY day  24 hr recall: Large portions Breakfast: skipped or grits and eggs or 4 eggs and 2 toast Chips popcorn Dinner: beef or chicken or fish + rice + cabbage with broccoli  Beverages: juice, coffee, water

## 2019-06-16 DIAGNOSIS — E119 Type 2 diabetes mellitus without complications: Secondary | ICD-10-CM | POA: Diagnosis not present

## 2019-06-24 DIAGNOSIS — M109 Gout, unspecified: Secondary | ICD-10-CM | POA: Insufficient documentation

## 2019-06-25 ENCOUNTER — Encounter: Payer: Self-pay | Admitting: Podiatry

## 2019-06-25 ENCOUNTER — Other Ambulatory Visit: Payer: Self-pay

## 2019-06-25 ENCOUNTER — Ambulatory Visit: Payer: Medicare HMO | Admitting: Podiatry

## 2019-06-25 VITALS — Temp 96.4°F

## 2019-06-25 DIAGNOSIS — M79675 Pain in left toe(s): Secondary | ICD-10-CM | POA: Diagnosis not present

## 2019-06-25 DIAGNOSIS — E1129 Type 2 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: Secondary | ICD-10-CM

## 2019-06-25 DIAGNOSIS — N186 End stage renal disease: Secondary | ICD-10-CM

## 2019-06-25 DIAGNOSIS — M79674 Pain in right toe(s): Secondary | ICD-10-CM

## 2019-06-25 DIAGNOSIS — B351 Tinea unguium: Secondary | ICD-10-CM | POA: Diagnosis not present

## 2019-06-25 NOTE — Progress Notes (Signed)
This patient presents  to my office for at risk foot care.  This patient requires this care by a professional since this patient will be at risk due to having ESRD and diabetes and coagulation defect.  This patient is taking plavix.  .  Patient had amputation of second toe left foot.  This patient is unable to cut nails himself since the patient cannot reach his nails.These nails are painful walking and wearing shoes.  This patient presents for at risk foot care today.  General Appearance  Alert, conversant and in no acute stress.  Vascular  Dorsalis pedis  pulses are  Weakly  palpable  bilaterally. Posterior tibial pulses are palpable  B/L. Capillary return is within normal limits  bilaterally. Temperature is within normal limits  bilaterally.  Neurologic  Senn-Weinstein monofilament wire test diminished/absent  bilaterally. Muscle power within normal limits bilaterally.  Nails Thick disfigured discolored nails with subungual debris  from hallux to fifth toes bilaterally except second digit left foot.. No evidence of bacterial infection or drainage bilaterally.  Orthopedic  No limitations of motion  feet .  No crepitus or effusions noted.  No bony pathology or digital deformities noted.  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   Skin  normotropic skin with no porokeratosis noted bilaterally.  No signs of infections or ulcers noted.     Onychomycosis  Pain in right toes  Pain in left toes  Consent was obtained for treatment procedures.   Mechanical debridement of nails 1-5  bilaterally performed with a nail nipper.  Filed with dremel without incident. Patient to e evaluated by pedorthist for diabetic insoles.   Return office visit    3 months                 Told patient to return for periodic foot care and evaluation due to potential at risk complications.   Gardiner Barefoot DPM

## 2019-06-26 ENCOUNTER — Other Ambulatory Visit: Payer: Self-pay | Admitting: Family Medicine

## 2019-06-27 DIAGNOSIS — E440 Moderate protein-calorie malnutrition: Secondary | ICD-10-CM | POA: Insufficient documentation

## 2019-06-30 DIAGNOSIS — N186 End stage renal disease: Secondary | ICD-10-CM | POA: Diagnosis not present

## 2019-06-30 DIAGNOSIS — Z992 Dependence on renal dialysis: Secondary | ICD-10-CM | POA: Diagnosis not present

## 2019-06-30 DIAGNOSIS — I871 Compression of vein: Secondary | ICD-10-CM | POA: Diagnosis not present

## 2019-06-30 DIAGNOSIS — T82858A Stenosis of vascular prosthetic devices, implants and grafts, initial encounter: Secondary | ICD-10-CM | POA: Diagnosis not present

## 2019-07-01 DIAGNOSIS — N186 End stage renal disease: Secondary | ICD-10-CM | POA: Diagnosis not present

## 2019-07-01 DIAGNOSIS — N2581 Secondary hyperparathyroidism of renal origin: Secondary | ICD-10-CM | POA: Diagnosis not present

## 2019-07-01 DIAGNOSIS — E1129 Type 2 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: Secondary | ICD-10-CM | POA: Diagnosis not present

## 2019-07-01 DIAGNOSIS — D689 Coagulation defect, unspecified: Secondary | ICD-10-CM | POA: Diagnosis not present

## 2019-07-01 DIAGNOSIS — Z992 Dependence on renal dialysis: Secondary | ICD-10-CM | POA: Diagnosis not present

## 2019-07-02 ENCOUNTER — Telehealth: Payer: Self-pay

## 2019-07-02 DIAGNOSIS — N186 End stage renal disease: Secondary | ICD-10-CM | POA: Diagnosis not present

## 2019-07-02 DIAGNOSIS — I150 Renovascular hypertension: Secondary | ICD-10-CM | POA: Diagnosis not present

## 2019-07-02 DIAGNOSIS — Z992 Dependence on renal dialysis: Secondary | ICD-10-CM | POA: Diagnosis not present

## 2019-07-02 NOTE — Telephone Encounter (Signed)
FAXED [place]: [facility]  Document: Statement of Provider for Therapeutic shoes (includes qualifying conditions) Other records requested: Office notes reflecting Dr. Cordelia Pen OWN foot assessment Faxed but NOT signed: Signature of In-person Visit. Dr. Loanne Drilling was not present for Dr. Burnell Blanks exam and therefore cannot sign for an assessment he did not see.  All above requested information has been faxed successfully to [employer] listed above. Documents and fax confirmation have been placed in the faxed file for future reference.

## 2019-07-03 DIAGNOSIS — N2581 Secondary hyperparathyroidism of renal origin: Secondary | ICD-10-CM | POA: Diagnosis not present

## 2019-07-03 DIAGNOSIS — N186 End stage renal disease: Secondary | ICD-10-CM | POA: Diagnosis not present

## 2019-07-03 DIAGNOSIS — D689 Coagulation defect, unspecified: Secondary | ICD-10-CM | POA: Diagnosis not present

## 2019-07-03 DIAGNOSIS — Z992 Dependence on renal dialysis: Secondary | ICD-10-CM | POA: Diagnosis not present

## 2019-07-03 DIAGNOSIS — E1129 Type 2 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: Secondary | ICD-10-CM | POA: Diagnosis not present

## 2019-07-09 ENCOUNTER — Encounter: Payer: Self-pay | Admitting: Endocrinology

## 2019-07-09 ENCOUNTER — Ambulatory Visit (INDEPENDENT_AMBULATORY_CARE_PROVIDER_SITE_OTHER): Payer: Medicare HMO | Admitting: Endocrinology

## 2019-07-09 ENCOUNTER — Other Ambulatory Visit: Payer: Self-pay

## 2019-07-09 VITALS — BP 106/64 | HR 89 | Ht 69.5 in | Wt 256.2 lb

## 2019-07-09 DIAGNOSIS — N186 End stage renal disease: Secondary | ICD-10-CM | POA: Diagnosis not present

## 2019-07-09 DIAGNOSIS — E1129 Type 2 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: Secondary | ICD-10-CM

## 2019-07-09 DIAGNOSIS — E1122 Type 2 diabetes mellitus with diabetic chronic kidney disease: Secondary | ICD-10-CM

## 2019-07-09 DIAGNOSIS — E119 Type 2 diabetes mellitus without complications: Secondary | ICD-10-CM

## 2019-07-09 LAB — POCT GLYCOSYLATED HEMOGLOBIN (HGB A1C): Hemoglobin A1C: 9.4 % — AB (ref 4.0–5.6)

## 2019-07-09 MED ORDER — FREESTYLE LIBRE 14 DAY SENSOR MISC: 1.0000 | 3 refills | Status: DC

## 2019-07-09 MED ORDER — INSULIN LISPRO (1 UNIT DIAL) 100 UNIT/ML (KWIKPEN): PEN_INJECTOR | SUBCUTANEOUS | 3 refills | Status: DC

## 2019-07-09 MED ORDER — BASAGLAR KWIKPEN 100 UNIT/ML ~~LOC~~ SOPN: 60.0000 [IU] | PEN_INJECTOR | Freq: Every day | SUBCUTANEOUS | 3 refills | Status: DC

## 2019-07-09 NOTE — Patient Instructions (Addendum)
check your blood sugar twice a day.  vary the time of day when you check, between before the 3 meals, and at bedtime.  also check if you have symptoms of your blood sugar being too high or too low.  please keep a record of the readings and bring it to your next appointment here (or you can bring the meter itself).  You can write it on any piece of paper.  please call us sooner if your blood sugar goes below 70, or if you have a lot of readings over 200. Please increase the Humalog to 70 units with breakfast, and 100 units with supper, and reduce the basaglar to 60 units at bedtime. Please come back for a follow-up appointment in 2 months.

## 2019-07-09 NOTE — Progress Notes (Signed)
Subjective:    Patient ID: Darryl Kelley, male    DOB: 1965-03-10, 54 y.o.   MRN: [id number]  HPI Pt returns for f/u of diabetes mellitus: DM type: Insulin-requiring type 2 Dx'ed: 6440 Complications: ESRD (on HD), toe amputation, and PN.   Therapy: insulin since 2006 DKA: never Severe hypoglycemia: last episode of was in 2015.   Pancreatitis: never Pancreatic imaging: never.   SDOH: none Other: He eats 2 meals per day (9 AM and 6 PM); he takes multiple daily injections.   Interval history: He takes humalog 70 units BID, and basaglar, 60 units qd.  Pt says he sonetimes misses the insulin.  I reviewed continuous glucose monitor data.  Glucose varies from 100-400.  It is in general highest at HS, and lowest in the afternoon.  He has mild hypoglycemia approx 1-2 times per week.  This happens fasting.  He says basaglar is 80 units qhs, and humalog is 60 with breakfast, and 90 with supper.  Past Medical History:  Diagnosis Date  . Chronic kidney disease   . Diabetes ([place])   . Dyslipidemia   . ESRD (end stage renal disease) on dialysis ([place])     No past surgical history on file.  Social History   Socioeconomic History  . Marital status: Single    Spouse name: Not on file  . Number of children: Not on file  . Years of education: Not on file  . Highest education level: Not on file  Occupational History  . Not on file  Tobacco Use  . Smoking status: Former Research scientist (life sciences)  . Smokeless tobacco: Never Used  Substance and Sexual Activity  . Alcohol use: Never  . Drug use: Never  . Sexual activity: Not Currently  Other Topics Concern  . Not on file  Social History Narrative  . Not on file   Social Determinants of Health   Financial Resource Strain:   . Difficulty of Paying Living Expenses:   Food Insecurity:   . Worried About Charity fundraiser in the Last Year:   . Arboriculturist in the Last Year:   Transportation Needs:   . Film/video editor (Medical):   Marland Kitchen Lack of  Transportation (Non-Medical):   Physical Activity:   . Days of Exercise per Week:   . Minutes of Exercise per Session:   Stress:   . Feeling of Stress :   Social Connections:   . Frequency of Communication with Friends and Family:   . Frequency of Social Gatherings with Friends and Family:   . Attends Religious Services:   . Active Member of Clubs or Organizations:   . Attends Archivist Meetings:   Marland Kitchen Marital Status:   Intimate Partner Violence:   . Fear of Current or Ex-Partner:   . Emotionally Abused:   Marland Kitchen Physically Abused:   . Sexually Abused:     Current Outpatient Medications on File Prior to Visit  Medication Sig Dispense Refill  . acetaminophen (TYLENOL) 650 MG CR tablet Take 650 mg by mouth as needed for pain.    Marland Kitchen amLODipine (NORVASC) 5 MG tablet Take 1 tablet (5 mg total) by mouth daily. 90 tablet 2  . aspirin 81 MG EC tablet Take 81 mg by mouth daily.     . carvedilol (COREG) 25 MG tablet Take 12.5 mg by mouth 2 (two) times daily with a meal.     . clopidogrel (PLAVIX) 75 MG tablet Take 75 mg by mouth daily.     Marland Kitchen  doxycycline (VIBRA-TABS) 100 MG tablet Take 100 mg by mouth 2 (two) times daily.     Marland Kitchen ezetimibe (ZETIA) 10 MG tablet Take 10 mg by mouth daily.     . fluticasone (FLONASE) 50 MCG/ACT nasal spray Place 2 sprays into both nostrils daily. 16 g 6  . heparin 1000 unit/mL SOLN injection Heparin Sodium (Porcine) 1,000 Units/mL Systemic    . Insulin Pen Needle (PEN NEEDLES) 31G X 8 MM MISC 1 each by Does not apply route 4 (four) times daily. 360 each 3  . LOPERAMIDE HCL PO Take 2 mg by mouth as needed.     . metoCLOPramide (REGLAN) 5 MG tablet TAKE 1 TABLET (5 MG TOTAL) BY MOUTH IN THE MORNING, AT NOON, IN THE EVENING, AND AT BEDTIME. 120 tablet 1  . omeprazole (PRILOSEC) 20 MG capsule Take 20 mg by mouth daily.     . pregabalin (LYRICA) 150 MG capsule Take 1 capsule (150 mg total) by mouth 2 (two) times daily. 180 capsule 2  . RENVELA 800 MG tablet TAKE 2  TABLETS BY MOUTH 3 TIMES A DAY AND ONE WITH SNACK 210 tablet 1  . rosuvastatin (CRESTOR) 10 MG tablet Take 1 tablet (10 mg total) by mouth daily. 90 tablet 3  . VASCEPA 1 g capsule TAKE 2 CAPSULES BY MOUTH TWICE A DAY 120 capsule 11  . VITAMIN D PO Take 0.75 mcg by mouth 3 (three) times a week.      No current facility-administered medications on file prior to visit.    Allergies  Allergen Reactions  . Atorvastatin Hives    Family History  Problem Relation Age of Onset  . Diabetes Neg Hx     BP 106/64   Pulse 89   Ht 5' 9.5" (1.765 m)   Wt 256 lb 3.2 oz (116.2 kg)   SpO2 92%   BMI 37.29 kg/m    Review of Systems     Objective:   Physical Exam VITAL SIGNS:  See vs page GENERAL: no distress Pulses: dorsalis pedis intact bilat.   MSK: left 3rd toe is surgically absent.  large right bunion.  CV: 1+ bilat leg edema Skin:  no ulcer on the feet.  normal color and temp on the feet. Neuro: sensation is intact to touch on the feet, but decreased from normal Ext: there is bilateral onychomycosis of the toenails.     Lab Results  Component Value Date   HGBA1C 9.4 (A) 07/09/2019   Lab Results  Component Value Date   CREATININE 9.62 (H) 04/16/2019   BUN 38 (H) 04/16/2019   NA 140 04/16/2019   K 4.7 04/16/2019   CL 92 (L) 04/16/2019   CO2 27 04/16/2019      Assessment & Plan:  Type 2 DM, with ESRD: worse.  He declines to d/c multiple daily injections.   Hypoglycemia, due to Basaglar: reduce today  Patient Instructions  check your blood sugar twice a day.  vary the time of day when you check, between before the 3 meals, and at bedtime.  also check if you have symptoms of your blood sugar being too high or too low.  please keep a record of the readings and bring it to your next appointment here (or you can bring the meter itself).  You can write it on any piece of paper.  please call us sooner if your blood sugar goes below 70, or if you have a lot of readings over  200. Please increase the  Humalog to 70 units with breakfast, and 100 units with supper, and reduce the basaglar to 60 units at bedtime. Please come back for a follow-up appointment in 2 months.

## 2019-07-16 ENCOUNTER — Encounter (INDEPENDENT_AMBULATORY_CARE_PROVIDER_SITE_OTHER): Payer: Medicare HMO | Admitting: Ophthalmology

## 2019-07-16 ENCOUNTER — Encounter (INDEPENDENT_AMBULATORY_CARE_PROVIDER_SITE_OTHER): Payer: Self-pay | Admitting: Ophthalmology

## 2019-07-16 ENCOUNTER — Ambulatory Visit (INDEPENDENT_AMBULATORY_CARE_PROVIDER_SITE_OTHER): Payer: Medicare HMO | Admitting: Ophthalmology

## 2019-07-16 ENCOUNTER — Other Ambulatory Visit: Payer: Self-pay

## 2019-07-16 DIAGNOSIS — E113592 Type 2 diabetes mellitus with proliferative diabetic retinopathy without macular edema, left eye: Secondary | ICD-10-CM

## 2019-07-16 DIAGNOSIS — E119 Type 2 diabetes mellitus without complications: Secondary | ICD-10-CM | POA: Diagnosis not present

## 2019-07-16 MED ORDER — BEVACIZUMAB CHEMO INJECTION 1.25MG/0.05ML SYRINGE FOR KALEIDOSCOPE
1.2500 mg | INTRAVITREAL | Status: AC | PRN
  Administered 2019-07-16: 1.25 mg via INTRAVITREAL

## 2019-07-16 NOTE — Progress Notes (Signed)
07/16/2019     CHIEF COMPLAINT Patient presents for Retina Follow Up   HISTORY OF PRESENT ILLNESS: Darryl Kelley is a 54 y.o. male who presents to the clinic today for:   HPI    Retina Follow Up    Patient presents with  Diabetic Retinopathy.  In left eye.  Duration of 10 weeks.  Since onset it is gradually worsening.          Comments    10 week f.u - OCT OU, Poss Avastin OS Patient states that he has had trouble reading since last visit. No other complaints. LBS 202 /// A1C 9  Recommend Avastin os today,  Follow up soon for PRP OS       Last edited by Hurman Horn, MD on 07/16/2019 11:08 AM. (History)      Referring physician: Hurman Horn, MD [place],  [place] [address]  HISTORICAL INFORMATION:   Selected notes from the MEDICAL RECORD NUMBER    Lab Results  Component Value Date   HGBA1C 9.4 (A) 07/09/2019     CURRENT MEDICATIONS: No current outpatient medications on file. (Ophthalmic Drugs)   No current facility-administered medications for this visit. (Ophthalmic Drugs)   Current Outpatient Medications (Other)  Medication Sig  . acetaminophen (TYLENOL) 650 MG CR tablet Take 650 mg by mouth as needed for pain.  Marland Kitchen amLODipine (NORVASC) 5 MG tablet Take 1 tablet (5 mg total) by mouth daily.  Marland Kitchen aspirin 81 MG EC tablet Take 81 mg by mouth daily.   . carvedilol (COREG) 25 MG tablet Take 12.5 mg by mouth 2 (two) times daily with a meal.   . clopidogrel (PLAVIX) 75 MG tablet Take 75 mg by mouth daily.   . Continuous Blood Gluc Sensor (FREESTYLE LIBRE 14 DAY SENSOR) MISC 1 each by Does not apply route every 14 (fourteen) days. For use with continuous glucose monitoring system. Change every 14 days; E11.9  . doxycycline (VIBRA-TABS) 100 MG tablet Take 100 mg by mouth 2 (two) times daily.   Marland Kitchen ezetimibe (ZETIA) 10 MG tablet Take 10 mg by mouth daily.   . fluticasone (FLONASE) 50 MCG/ACT nasal spray Place 2 sprays into both nostrils daily.  .  heparin 1000 unit/mL SOLN injection Heparin Sodium (Porcine) 1,000 Units/mL Systemic  . Insulin Glargine (BASAGLAR KWIKPEN) 100 UNIT/ML Inject 0.6 mLs (60 Units total) into the skin at bedtime.  . insulin lispro (HUMALOG) 100 UNIT/ML KwikPen 70 units with breakfast, and 100 units with supper.  . Insulin Pen Needle (PEN NEEDLES) 31G X 8 MM MISC 1 each by Does not apply route 4 (four) times daily.  Marland Kitchen LOPERAMIDE HCL PO Take 2 mg by mouth as needed.   . metoCLOPramide (REGLAN) 5 MG tablet TAKE 1 TABLET (5 MG TOTAL) BY MOUTH IN THE MORNING, AT NOON, IN THE EVENING, AND AT BEDTIME.  Marland Kitchen omeprazole (PRILOSEC) 20 MG capsule Take 20 mg by mouth daily.   . pregabalin (LYRICA) 150 MG capsule Take 1 capsule (150 mg total) by mouth 2 (two) times daily.  Marland Kitchen RENVELA 800 MG tablet TAKE 2 TABLETS BY MOUTH 3 TIMES A DAY AND ONE WITH SNACK  . rosuvastatin (CRESTOR) 10 MG tablet Take 1 tablet (10 mg total) by mouth daily.  Marland Kitchen VASCEPA 1 g capsule TAKE 2 CAPSULES BY MOUTH TWICE A DAY  . VITAMIN D PO Take 0.75 mcg by mouth 3 (three) times a week.    No current facility-administered medications for this visit. (  Other)      REVIEW OF SYSTEMS:    ALLERGIES Allergies  Allergen Reactions  . Atorvastatin Hives    PAST MEDICAL HISTORY Past Medical History:  Diagnosis Date  . Chronic kidney disease   . Diabetes ([place])   . Dyslipidemia   . ESRD (end stage renal disease) on dialysis [facility])    History reviewed. No pertinent surgical history.  FAMILY HISTORY Family History  Problem Relation Age of Onset  . Diabetes Neg Hx     SOCIAL HISTORY Social History   Tobacco Use  . Smoking status: Former Research scientist (life sciences)  . Smokeless tobacco: Never Used  Substance Use Topics  . Alcohol use: Never  . Drug use: Never         OPHTHALMIC EXAM:  Base Eye Exam    Visual Acuity (Snellen - Linear)      Right Left   Dist cc 20/20 20/25-2   Correction: Glasses       Tonometry (Tonopen, 10:31 AM)      Right Left    Pressure 11 8       Pupils      Pupils Dark Light Shape React APD   Right PERRL 3 3 Round Minimal None   Left PERRL 3 3 Round Minimal None       Visual Fields (Counting fingers)      Left Right    Full Full       Extraocular Movement      Right Left    Full Full       Neuro/Psych    Oriented x3: Yes   Mood/Affect: Normal       Dilation    Left eye: 1.0% Mydriacyl, 2.5% Phenylephrine @ 10:31 AM        Slit Lamp and Fundus Exam    External Exam      Right Left   External Normal Normal       Slit Lamp Exam      Right Left   Lids/Lashes Normal Normal   Conjunctiva/Sclera White and quiet White and quiet   Cornea Clear Clear   Anterior Chamber Deep and quiet Deep and quiet   Iris Round and reactive Round and reactive   Anterior Vitreous Normal Normal       Fundus Exam      Right Left   Posterior Vitreous  Normal   Disc  Normal   C/D Ratio  0.2   Macula  Microaneurysms, no clinically significant macular edema, no macular thickening   Vessels  NPDR- Moderate   Periphery  Normal          IMAGING AND PROCEDURES  Imaging and Procedures for 07/16/19  Intravitreal Injection, Pharmacologic Agent - OS - Left Eye       Time Out 07/16/2019. 11:19 AM. Confirmed correct patient, procedure, site, and patient consented.   Anesthesia Topical anesthesia was used. Anesthetic medications included [place] 3.5%.   Procedure Preparation included Tobramycin 0.3%, Ofloxacin , 10% betadine to eyelids, 5% betadine to ocular surface. A 30 gauge needle was used.   Injection:  1.25 mg Bevacizumab (AVASTIN) SOLN   NDC: [address]-7, Lot: [id number]   Route: Intravitreal, Site: Left Eye, Waste: 0 mg  Post-op Post injection exam found visual acuity of at least counting fingers. The patient tolerated the procedure well. There were no complications. Post injection medications were not given.                 ASSESSMENT/PLAN:  No problem-specific Assessment &  Plan notes  found for this encounter.      ICD-10-CM   1. 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  3.  Ophthalmic Meds Ordered this visit:  Meds ordered this encounter  Medications  . Bevacizumab (AVASTIN) SOLN 1.25 mg       Return in about 2 weeks (around 07/30/2019) for OS, dilate, PRP.  There are no Patient Instructions on file for this visit.   Explained the diagnoses, plan, and follow up with the patient and they expressed understanding.  Patient expressed understanding of the importance of proper follow up care.   Clent Demark Nahum Sherrer M.D. Diseases & Surgery of the Retina and Vitreous Retina & Diabetic [place] 07/16/19     Abbreviations: M myopia (nearsighted); A astigmatism; H hyperopia (farsighted); P presbyopia; Mrx spectacle prescription;  CTL contact lenses; OD right eye; OS left eye; OU both eyes  XT exotropia; ET esotropia; PEK punctate epithelial keratitis; PEE punctate epithelial erosions; DES dry eye syndrome; MGD meibomian gland dysfunction; ATs artificial tears; PFAT's preservative free artificial tears; [place] nuclear sclerotic cataract; PSC posterior subcapsular cataract; ERM epi-retinal membrane; PVD posterior vitreous detachment; RD retinal detachment; DM diabetes mellitus; DR diabetic retinopathy; NPDR non-proliferative diabetic retinopathy; PDR proliferative diabetic retinopathy; CSME clinically significant macular edema; DME diabetic macular edema; dbh dot blot hemorrhages; CWS cotton wool spot; POAG primary open angle glaucoma; C/D cup-to-disc ratio; HVF humphrey visual field; GVF goldmann visual field; OCT optical coherence tomography; IOP intraocular pressure; BRVO Branch retinal vein occlusion; CRVO central retinal vein  occlusion; CRAO central retinal artery occlusion; BRAO branch retinal artery occlusion; RT retinal tear; SB scleral buckle; PPV pars plana vitrectomy; VH Vitreous hemorrhage; PRP panretinal laser photocoagulation; IVK intravitreal kenalog; VMT vitreomacular traction; MH Macular hole;  NVD neovascularization of the disc; NVE neovascularization elsewhere; AREDS age related eye disease study; ARMD age related macular degeneration; POAG primary open angle glaucoma; EBMD epithelial/anterior basement membrane dystrophy; ACIOL anterior chamber intraocular lens; IOL intraocular lens; PCIOL posterior chamber intraocular lens; Phaco/IOL phacoemulsification with intraocular lens placement; [place] photorefractive keratectomy; LASIK laser assisted in situ keratomileusis; HTN hypertension; DM diabetes mellitus; COPD chronic obstructive pulmonary disease

## 2019-07-30 ENCOUNTER — Ambulatory Visit (INDEPENDENT_AMBULATORY_CARE_PROVIDER_SITE_OTHER): Payer: Medicare HMO | Admitting: Ophthalmology

## 2019-07-30 ENCOUNTER — Encounter (INDEPENDENT_AMBULATORY_CARE_PROVIDER_SITE_OTHER): Payer: Medicare HMO | Admitting: Ophthalmology

## 2019-07-30 ENCOUNTER — Encounter (INDEPENDENT_AMBULATORY_CARE_PROVIDER_SITE_OTHER): Payer: Self-pay | Admitting: Ophthalmology

## 2019-07-30 ENCOUNTER — Other Ambulatory Visit: Payer: Self-pay

## 2019-07-30 DIAGNOSIS — E113592 Type 2 diabetes mellitus with proliferative diabetic retinopathy without macular edema, left eye: Secondary | ICD-10-CM | POA: Diagnosis not present

## 2019-07-30 NOTE — Progress Notes (Signed)
07/30/2019     CHIEF COMPLAINT Patient presents for Retina Follow Up   HISTORY OF PRESENT ILLNESS: Darryl Kelley is a 54 y.o. male who presents to the clinic today for:   HPI    Retina Follow Up    Patient presents with  Diabetic Retinopathy.  In left eye.  Duration of 2 weeks.  Since onset it is stable.          Comments    2 week follow up - Poss PRP OS Patient denies change in vision and overall has no complaints. LBS 457 just now /// A1C 9          Last edited by Hurman Horn, MD on 07/30/2019 10:31 AM. (History)      Referring physician: Gifford Shave, MD [address],  [place] [address]  HISTORICAL INFORMATION:   Selected notes from the MEDICAL RECORD NUMBER    Lab Results  Component Value Date   HGBA1C 9.4 (A) 07/09/2019     CURRENT MEDICATIONS: No current outpatient medications on file. (Ophthalmic Drugs)   No current facility-administered medications for this visit. (Ophthalmic Drugs)   Current Outpatient Medications (Other)  Medication Sig  . acetaminophen (TYLENOL) 650 MG CR tablet Take 650 mg by mouth as needed for pain.  Marland Kitchen amLODipine (NORVASC) 5 MG tablet Take 1 tablet (5 mg total) by mouth daily.  Marland Kitchen aspirin 81 MG EC tablet Take 81 mg by mouth daily.   . carvedilol (COREG) 25 MG tablet Take 12.5 mg by mouth 2 (two) times daily with a meal.   . clopidogrel (PLAVIX) 75 MG tablet Take 75 mg by mouth daily.   . Continuous Blood Gluc Sensor (FREESTYLE LIBRE 14 DAY SENSOR) MISC 1 each by Does not apply route every 14 (fourteen) days. For use with continuous glucose monitoring system. Change every 14 days; E11.9  . doxycycline (VIBRA-TABS) 100 MG tablet Take 100 mg by mouth 2 (two) times daily.   Marland Kitchen ezetimibe (ZETIA) 10 MG tablet Take 10 mg by mouth daily.   . fluticasone (FLONASE) 50 MCG/ACT nasal spray Place 2 sprays into both nostrils daily.  . heparin 1000 unit/mL SOLN injection Heparin Sodium (Porcine) 1,000 Units/mL Systemic  .  Insulin Glargine (BASAGLAR KWIKPEN) 100 UNIT/ML Inject 0.6 mLs (60 Units total) into the skin at bedtime.  . insulin lispro (HUMALOG) 100 UNIT/ML KwikPen 70 units with breakfast, and 100 units with supper.  . Insulin Pen Needle (PEN NEEDLES) 31G X 8 MM MISC 1 each by Does not apply route 4 (four) times daily.  Marland Kitchen LOPERAMIDE HCL PO Take 2 mg by mouth as needed.   . metoCLOPramide (REGLAN) 5 MG tablet TAKE 1 TABLET (5 MG TOTAL) BY MOUTH IN THE MORNING, AT NOON, IN THE EVENING, AND AT BEDTIME.  Marland Kitchen omeprazole (PRILOSEC) 20 MG capsule Take 20 mg by mouth daily.   . pregabalin (LYRICA) 150 MG capsule Take 1 capsule (150 mg total) by mouth 2 (two) times daily.  Marland Kitchen RENVELA 800 MG tablet TAKE 2 TABLETS BY MOUTH 3 TIMES A DAY AND ONE WITH SNACK  . rosuvastatin (CRESTOR) 10 MG tablet Take 1 tablet (10 mg total) by mouth daily.  Marland Kitchen VASCEPA 1 g capsule TAKE 2 CAPSULES BY MOUTH TWICE A DAY  . VITAMIN D PO Take 0.75 mcg by mouth 3 (three) times a week.    No current facility-administered medications for this visit. (Other)      REVIEW OF SYSTEMS:    ALLERGIES  Allergies  Allergen Reactions  . Atorvastatin Hives    PAST MEDICAL HISTORY Past Medical History:  Diagnosis Date  . Chronic kidney disease   . Diabetes ([place])   . Dyslipidemia   . ESRD (end stage renal disease) on dialysis [facility])    History reviewed. No pertinent surgical history.  FAMILY HISTORY Family History  Problem Relation Age of Onset  . Diabetes Neg Hx     SOCIAL HISTORY Social History   Tobacco Use  . Smoking status: Former Research scientist (life sciences)  . Smokeless tobacco: Never Used  Substance Use Topics  . Alcohol use: Never  . Drug use: Never         OPHTHALMIC EXAM:  Base Eye Exam    Visual Acuity (Snellen - Linear)      Right Left   Dist cc 20/20-1 20/25-1       Tonometry (Tonopen, 9:56 AM)      Right Left   Pressure 14 15       Pupils      Pupils Dark Light Shape React APD   Right PERRL 3 3 Round Minimal None    Left PERRL 3 3 Round Minimal None       Visual Fields (Counting fingers)      Left Right    Full Full       Extraocular Movement      Right Left    Full Full       Neuro/Psych    Oriented x3: Yes   Mood/Affect: Normal       Dilation    Left eye: 1.0% Mydriacyl, 2.5% Phenylephrine @ 9:56 AM        Slit Lamp and Fundus Exam    External Exam      Right Left   External Normal Normal       Slit Lamp Exam      Right Left   Lids/Lashes Normal Normal   Conjunctiva/Sclera White and quiet White and quiet   Cornea Clear Clear   Anterior Chamber Deep and quiet Deep and quiet   Iris Round and reactive Round and reactive   Vitreous Normal Normal          IMAGING AND PROCEDURES  Imaging and Procedures for 07/30/19  Panretinal Photocoagulation - OS - Left Eye       Time Out Confirmed correct patient, procedure, site, and patient consented.   Anesthesia Topical anesthesia was used. Anesthetic medications included Proparacaine 0.5%.   Laser Information The type of laser was diode. Color was yellow. The duration in seconds was 0.02. The spot size was 390 microns. Laser power was 260. Total spots was 1278.   Post-op The patient tolerated the procedure well. There were no complications. The patient received written and verbal post procedure care education.                 ASSESSMENT/PLAN:  No problem-specific Assessment & Plan notes found for this encounter.      ICD-10-CM   1. 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 OS, post injection intravitreal Avastin to control PDR, now for PRP #1 OS today  2. OD, with very severe NPDR and very early PDR with neovascularization beginning yet not requiring intravitreal injection. Will return follow-up visit in 2 weeks for PRP OD  3.  Ophthalmic Meds Ordered this visit:  No orders  of  the defined types were placed in this encounter.      Return in about 2 weeks (around 08/13/2019) for PRP, dilate, OD.  There are no Patient Instructions on file for this visit.   Explained the diagnoses, plan, and follow up with the patient and they expressed understanding.  Patient expressed understanding of the importance of proper follow up care.   Clent Demark Kellen Dutch M.D. Diseases & Surgery of the Retina and Vitreous Retina & Diabetic [place] 07/30/19     Abbreviations: M myopia (nearsighted); A astigmatism; H hyperopia (farsighted); P presbyopia; Mrx spectacle prescription;  CTL contact lenses; OD right eye; OS left eye; OU both eyes  XT exotropia; ET esotropia; PEK punctate epithelial keratitis; PEE punctate epithelial erosions; DES dry eye syndrome; MGD meibomian gland dysfunction; ATs artificial tears; PFAT's preservative free artificial tears; [place] nuclear sclerotic cataract; PSC posterior subcapsular cataract; ERM epi-retinal membrane; PVD posterior vitreous detachment; RD retinal detachment; DM diabetes mellitus; DR diabetic retinopathy; NPDR non-proliferative diabetic retinopathy; PDR proliferative diabetic retinopathy; CSME clinically significant macular edema; DME diabetic macular edema; dbh dot blot hemorrhages; CWS cotton wool spot; POAG primary open angle glaucoma; C/D cup-to-disc ratio; HVF humphrey visual field; GVF goldmann visual field; OCT optical coherence tomography; IOP intraocular pressure; BRVO Branch retinal vein occlusion; CRVO central retinal vein occlusion; CRAO central retinal artery occlusion; BRAO branch retinal artery occlusion; RT retinal tear; SB scleral buckle; PPV pars plana vitrectomy; VH Vitreous hemorrhage; PRP panretinal laser photocoagulation; IVK intravitreal kenalog; VMT vitreomacular traction; MH Macular hole;  NVD neovascularization of the disc; NVE neovascularization elsewhere; AREDS age related eye disease study; ARMD age related macular  degeneration; POAG primary open angle glaucoma; EBMD epithelial/anterior basement membrane dystrophy; ACIOL anterior chamber intraocular lens; IOL intraocular lens; PCIOL posterior chamber intraocular lens; Phaco/IOL phacoemulsification with intraocular lens placement; [place] photorefractive keratectomy; LASIK laser assisted in situ keratomileusis; HTN hypertension; DM diabetes mellitus; COPD chronic obstructive pulmonary disease

## 2019-08-02 DIAGNOSIS — I150 Renovascular hypertension: Secondary | ICD-10-CM | POA: Diagnosis not present

## 2019-08-02 DIAGNOSIS — Z992 Dependence on renal dialysis: Secondary | ICD-10-CM | POA: Diagnosis not present

## 2019-08-02 DIAGNOSIS — N186 End stage renal disease: Secondary | ICD-10-CM | POA: Diagnosis not present

## 2019-08-05 DIAGNOSIS — N2581 Secondary hyperparathyroidism of renal origin: Secondary | ICD-10-CM | POA: Diagnosis not present

## 2019-08-05 DIAGNOSIS — Z992 Dependence on renal dialysis: Secondary | ICD-10-CM | POA: Diagnosis not present

## 2019-08-05 DIAGNOSIS — N186 End stage renal disease: Secondary | ICD-10-CM | POA: Diagnosis not present

## 2019-08-05 DIAGNOSIS — E1129 Type 2 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: Secondary | ICD-10-CM | POA: Diagnosis not present

## 2019-08-05 DIAGNOSIS — D689 Coagulation defect, unspecified: Secondary | ICD-10-CM | POA: Diagnosis not present

## 2019-08-06 ENCOUNTER — Encounter (INDEPENDENT_AMBULATORY_CARE_PROVIDER_SITE_OTHER): Payer: Medicare HMO | Admitting: Ophthalmology

## 2019-08-13 ENCOUNTER — Encounter (INDEPENDENT_AMBULATORY_CARE_PROVIDER_SITE_OTHER): Payer: Medicare HMO | Admitting: Ophthalmology

## 2019-08-16 DIAGNOSIS — E119 Type 2 diabetes mellitus without complications: Secondary | ICD-10-CM | POA: Diagnosis not present

## 2019-08-20 ENCOUNTER — Other Ambulatory Visit: Payer: Self-pay

## 2019-08-20 MED ORDER — PREGABALIN 150 MG PO CAPS: 150.0000 mg | ORAL_CAPSULE | Freq: Two times a day (BID) | ORAL | 2 refills | Status: DC

## 2019-08-25 DIAGNOSIS — R200 Anesthesia of skin: Secondary | ICD-10-CM | POA: Insufficient documentation

## 2019-09-02 DIAGNOSIS — N186 End stage renal disease: Secondary | ICD-10-CM | POA: Diagnosis not present

## 2019-09-02 DIAGNOSIS — Z992 Dependence on renal dialysis: Secondary | ICD-10-CM | POA: Diagnosis not present

## 2019-09-02 DIAGNOSIS — I150 Renovascular hypertension: Secondary | ICD-10-CM | POA: Diagnosis not present

## 2019-09-04 DIAGNOSIS — N186 End stage renal disease: Secondary | ICD-10-CM | POA: Diagnosis not present

## 2019-09-04 DIAGNOSIS — T7840XA Allergy, unspecified, initial encounter: Secondary | ICD-10-CM | POA: Diagnosis not present

## 2019-09-04 DIAGNOSIS — D689 Coagulation defect, unspecified: Secondary | ICD-10-CM | POA: Diagnosis not present

## 2019-09-04 DIAGNOSIS — N2581 Secondary hyperparathyroidism of renal origin: Secondary | ICD-10-CM | POA: Diagnosis not present

## 2019-09-04 DIAGNOSIS — Z992 Dependence on renal dialysis: Secondary | ICD-10-CM | POA: Diagnosis not present

## 2019-09-04 DIAGNOSIS — E1129 Type 2 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: Secondary | ICD-10-CM | POA: Diagnosis not present

## 2019-09-13 ENCOUNTER — Other Ambulatory Visit: Payer: Self-pay | Admitting: Family Medicine

## 2019-09-16 DIAGNOSIS — E119 Type 2 diabetes mellitus without complications: Secondary | ICD-10-CM | POA: Diagnosis not present

## 2019-09-17 ENCOUNTER — Ambulatory Visit (INDEPENDENT_AMBULATORY_CARE_PROVIDER_SITE_OTHER): Payer: Medicare HMO | Admitting: Endocrinology

## 2019-09-17 ENCOUNTER — Encounter: Payer: Self-pay | Admitting: Endocrinology

## 2019-09-17 ENCOUNTER — Other Ambulatory Visit: Payer: Self-pay

## 2019-09-17 VITALS — BP 100/68 | HR 87 | Ht 69.0 in | Wt 252.0 lb

## 2019-09-17 DIAGNOSIS — E1129 Type 2 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: Secondary | ICD-10-CM

## 2019-09-17 LAB — POCT GLYCOSYLATED HEMOGLOBIN (HGB A1C): Hemoglobin A1C: 9.7 % — AB (ref 4.0–5.6)

## 2019-09-17 MED ORDER — INSULIN ISOPHANE HUMAN 100 UNIT/ML KWIKPEN: 200.0000 [IU] | PEN_INJECTOR | SUBCUTANEOUS | 3 refills | Status: DC

## 2019-09-17 NOTE — Progress Notes (Signed)
Subjective:    Patient ID: Darryl Kelley, male    DOB: July 07, 1965, 54 y.o.   MRN: [id number]  HPI Pt returns for f/u of diabetes mellitus: DM type: Insulin-requiring type 2 Dx'ed: 5397 Complications: ESRD (on HD), toe amputation, and PN.   Therapy: insulin since 2006 DKA: never Severe hypoglycemia: last episode of was in 2015.   Pancreatitis: never Pancreatic imaging: never.   SDOH: none Other: He eats 2 meals per day (9 AM and 6 PM); he takes multiple daily injections; he declines to add non-insulin rx.   Interval history: Pt says he still sonetimes misses the insulin.  I reviewed continuous glucose monitor data.  Data are minimal  It is in general highest at HS, and lowest in the afternoon.  He has mild hypoglycemia approx once per month.  This happens in the middle of the night.  Past Medical History:  Diagnosis Date  . Chronic kidney disease   . Diabetes ([place])   . Dyslipidemia   . ESRD (end stage renal disease) on dialysis ([place])     No past surgical history on file.  Social History   Socioeconomic History  . Marital status: Single    Spouse name: Not on file  . Number of children: Not on file  . Years of education: Not on file  . Highest education level: Not on file  Occupational History  . Not on file  Tobacco Use  . Smoking status: Former Research scientist (life sciences)  . Smokeless tobacco: Never Used  Substance and Sexual Activity  . Alcohol use: Never  . Drug use: Never  . Sexual activity: Not Currently  Other Topics Concern  . Not on file  Social History Narrative  . Not on file   Social Determinants of Health   Financial Resource Strain:   . Difficulty of Paying Living Expenses: Not on file  Food Insecurity:   . Worried About Charity fundraiser in the Last Year: Not on file  . Ran Out of Food in the Last Year: Not on file  Transportation Needs:   . Lack of Transportation (Medical): Not on file  . Lack of Transportation (Non-Medical): Not on file  Physical Activity:    . Days of Exercise per Week: Not on file  . Minutes of Exercise per Session: Not on file  Stress:   . Feeling of Stress : Not on file  Social Connections:   . Frequency of Communication with Friends and Family: Not on file  . Frequency of Social Gatherings with Friends and Family: Not on file  . Attends Religious Services: Not on file  . Active Member of Clubs or Organizations: Not on file  . Attends Archivist Meetings: Not on file  . Marital Status: Not on file  Intimate Partner Violence:   . Fear of Current or Ex-Partner: Not on file  . Emotionally Abused: Not on file  . Physically Abused: Not on file  . Sexually Abused: Not on file    Current Outpatient Medications on File Prior to Visit  Medication Sig Dispense Refill  . acetaminophen (TYLENOL) 650 MG CR tablet Take 650 mg by mouth as needed for pain.    Marland Kitchen amLODipine (NORVASC) 5 MG tablet Take 1 tablet (5 mg total) by mouth daily. 90 tablet 2  . aspirin 81 MG EC tablet Take 81 mg by mouth daily.     . carvedilol (COREG) 25 MG tablet Take 12.5 mg by mouth 2 (two) times daily with a meal.     .  clopidogrel (PLAVIX) 75 MG tablet Take 75 mg by mouth daily.     . Continuous Blood Gluc Sensor (FREESTYLE LIBRE 14 DAY SENSOR) MISC 1 each by Does not apply route every 14 (fourteen) days. For use with continuous glucose monitoring system. Change every 14 days; E11.9 6 each 3  . doxycycline (VIBRA-TABS) 100 MG tablet Take 100 mg by mouth 2 (two) times daily.     Marland Kitchen ezetimibe (ZETIA) 10 MG tablet Take 10 mg by mouth daily.     . fluticasone (FLONASE) 50 MCG/ACT nasal spray Place 2 sprays into both nostrils daily. 16 g 6  . heparin 1000 unit/mL SOLN injection Heparin Sodium (Porcine) 1,000 Units/mL Systemic    . Insulin Pen Needle (PEN NEEDLES) 31G X 8 MM MISC 1 each by Does not apply route 4 (four) times daily. 360 each 3  . LOPERAMIDE HCL PO Take 2 mg by mouth as needed.     . metoCLOPramide (REGLAN) 5 MG tablet TAKE 1 TABLET BY  MOUTH IN THE MORNING,1 AT NOON, 1 IN THE EVENING, AND 1 AT BEDTIME. 120 tablet 1  . omeprazole (PRILOSEC) 20 MG capsule Take 20 mg by mouth daily.     . pregabalin (LYRICA) 150 MG capsule Take 1 capsule (150 mg total) by mouth 2 (two) times daily. 180 capsule 2  . RENVELA 800 MG tablet TAKE 2 TABLETS BY MOUTH 3 TIMES A DAY AND ONE WITH SNACK 210 tablet 1  . rosuvastatin (CRESTOR) 10 MG tablet Take 1 tablet (10 mg total) by mouth daily. 90 tablet 3  . VASCEPA 1 g capsule TAKE 2 CAPSULES BY MOUTH TWICE A DAY 120 capsule 11  . VITAMIN D PO Take 0.75 mcg by mouth 3 (three) times a week.      No current facility-administered medications on file prior to visit.    Allergies  Allergen Reactions  . Atorvastatin Hives    Family History  Problem Relation Age of Onset  . Diabetes Neg Hx     BP 100/68   Pulse 87   Ht 5\' 9"  (1.753 m)   Wt 252 lb (114.3 kg)   SpO2 94%   BMI 37.21 kg/m    Review of Systems     Objective:   Physical Exam VITAL SIGNS:  See vs page GENERAL: no distress Pulses: dorsalis pedis intact bilat.   MSK: no deformity of the feet CV: no leg edema Skin:  no ulcer on the feet.  normal color and temp on the feet. Neuro: sensation is intact to touch on the feet  Lab Results  Component Value Date   HGBA1C 9.7 (A) 09/17/2019       Assessment & Plan:  Insulin-requiring type 2 DM, with ESRD: uncontrolled. We'll change to QD insulin.  Obesity: I advised pt to add GLP med.  He declines  Patient Instructions  check your blood sugar twice a day.  vary the time of day when you check, between before the 3 meals, and at bedtime.  also check if you have symptoms of your blood sugar being too high or too low.  please keep a record of the readings and bring it to your next appointment here (or you can bring the meter itself).  You can write it on any piece of paper.  please call us sooner if your blood sugar goes below 70, or if you have a lot of readings over 200. Please  change both insulins to "NPH," 200 units each morning. On this  type of insulin schedule, you should eat meals on a regular schedule.  If a meal is missed or significantly delayed, your blood sugar could go low.   Please come back for a follow-up appointment in 2 months.

## 2019-09-17 NOTE — Patient Instructions (Addendum)
check your blood sugar twice a day.  vary the time of day when you check, between before the 3 meals, and at bedtime.  also check if you have symptoms of your blood sugar being too high or too low.  please keep a record of the readings and bring it to your next appointment here (or you can bring the meter itself).  You can write it on any piece of paper.  please call us sooner if your blood sugar goes below 70, or if you have a lot of readings over 200. Please change both insulins to "NPH," 200 units each morning. On this type of insulin schedule, you should eat meals on a regular schedule.  If a meal is missed or significantly delayed, your blood sugar could go low.   Please come back for a follow-up appointment in 2 months.

## 2019-09-24 ENCOUNTER — Other Ambulatory Visit: Payer: Self-pay

## 2019-09-24 ENCOUNTER — Ambulatory Visit: Payer: Medicare HMO | Admitting: Podiatry

## 2019-09-24 ENCOUNTER — Encounter: Payer: Self-pay | Admitting: Podiatry

## 2019-09-24 DIAGNOSIS — M79674 Pain in right toe(s): Secondary | ICD-10-CM

## 2019-09-24 DIAGNOSIS — N186 End stage renal disease: Secondary | ICD-10-CM

## 2019-09-24 DIAGNOSIS — M79675 Pain in left toe(s): Secondary | ICD-10-CM | POA: Diagnosis not present

## 2019-09-24 DIAGNOSIS — E1129 Type 2 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: Secondary | ICD-10-CM

## 2019-09-24 DIAGNOSIS — B351 Tinea unguium: Secondary | ICD-10-CM | POA: Diagnosis not present

## 2019-09-24 NOTE — Progress Notes (Signed)
This patient presents  to my office for at risk foot care.  This patient requires this care by a professional since this patient will be at risk due to having ESRD and diabetes and coagulation defect.  This patient is taking plavix.  .  Patient had amputation of second toe left foot.  This patient is unable to cut nails himself since the patient cannot reach his nails.These nails are painful walking and wearing shoes.  This patient presents for at risk foot care today.  General Appearance  Alert, conversant and in no acute stress.  Vascular  Dorsalis pedis  pulses are  Weakly  palpable  bilaterally. Posterior tibial pulses are palpable  B/L. Capillary return is within normal limits  bilaterally. Temperature is within normal limits  bilaterally.  Neurologic  Senn-Weinstein monofilament wire test diminished/absent  bilaterally. Muscle power within normal limits bilaterally.  Nails Thick disfigured discolored nails with subungual debris  from hallux to fifth toes bilaterally except second digit left foot.. No evidence of bacterial infection or drainage bilaterally.  Orthopedic  No limitations of motion  feet .  No crepitus or effusions noted.  No bony pathology or digital deformities noted.  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   Skin  normotropic skin with no porokeratosis noted bilaterally.  No signs of infections or ulcers noted.     Onychomycosis  Pain in right toes  Pain in left toes  Consent was obtained for treatment procedures.   Mechanical debridement of nails 1-5  bilaterally performed with a nail nipper.  Filed with dremel without incident. Patient to e evaluated by pedorthist for diabetic insoles.   Return office visit    3 months                 Told patient to return for periodic foot care and evaluation due to potential at risk complications.   Gardiner Barefoot DPM

## 2019-09-26 DIAGNOSIS — Z7682 Awaiting organ transplant status: Secondary | ICD-10-CM | POA: Diagnosis not present

## 2019-09-26 DIAGNOSIS — Z01818 Encounter for other preprocedural examination: Secondary | ICD-10-CM | POA: Diagnosis not present

## 2019-09-26 DIAGNOSIS — Z125 Encounter for screening for malignant neoplasm of prostate: Secondary | ICD-10-CM | POA: Diagnosis not present

## 2019-09-26 DIAGNOSIS — Z87891 Personal history of nicotine dependence: Secondary | ICD-10-CM | POA: Diagnosis not present

## 2019-09-26 DIAGNOSIS — Z992 Dependence on renal dialysis: Secondary | ICD-10-CM | POA: Diagnosis not present

## 2019-09-26 DIAGNOSIS — E1122 Type 2 diabetes mellitus with diabetic chronic kidney disease: Secondary | ICD-10-CM | POA: Diagnosis not present

## 2019-09-26 DIAGNOSIS — N186 End stage renal disease: Secondary | ICD-10-CM | POA: Diagnosis not present

## 2019-09-26 DIAGNOSIS — Z1211 Encounter for screening for malignant neoplasm of colon: Secondary | ICD-10-CM | POA: Diagnosis not present

## 2019-09-29 ENCOUNTER — Other Ambulatory Visit: Payer: Self-pay | Admitting: Endocrinology

## 2019-09-30 NOTE — Telephone Encounter (Signed)
Humulin N is not covered by insurance and requesting to change to Novolin

## 2019-09-30 NOTE — Telephone Encounter (Signed)
Either is fine with me.

## 2019-10-02 DIAGNOSIS — Z992 Dependence on renal dialysis: Secondary | ICD-10-CM | POA: Diagnosis not present

## 2019-10-02 DIAGNOSIS — D689 Coagulation defect, unspecified: Secondary | ICD-10-CM | POA: Diagnosis not present

## 2019-10-02 DIAGNOSIS — I150 Renovascular hypertension: Secondary | ICD-10-CM | POA: Diagnosis not present

## 2019-10-02 DIAGNOSIS — T7840XA Allergy, unspecified, initial encounter: Secondary | ICD-10-CM | POA: Diagnosis not present

## 2019-10-02 DIAGNOSIS — E1129 Type 2 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: Secondary | ICD-10-CM | POA: Diagnosis not present

## 2019-10-02 DIAGNOSIS — N186 End stage renal disease: Secondary | ICD-10-CM | POA: Diagnosis not present

## 2019-10-02 DIAGNOSIS — N2581 Secondary hyperparathyroidism of renal origin: Secondary | ICD-10-CM | POA: Diagnosis not present

## 2019-10-04 DIAGNOSIS — N186 End stage renal disease: Secondary | ICD-10-CM | POA: Diagnosis not present

## 2019-10-04 DIAGNOSIS — E1129 Type 2 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: Secondary | ICD-10-CM | POA: Diagnosis not present

## 2019-10-04 DIAGNOSIS — Z992 Dependence on renal dialysis: Secondary | ICD-10-CM | POA: Diagnosis not present

## 2019-10-04 DIAGNOSIS — T7840XA Allergy, unspecified, initial encounter: Secondary | ICD-10-CM | POA: Diagnosis not present

## 2019-10-04 DIAGNOSIS — D689 Coagulation defect, unspecified: Secondary | ICD-10-CM | POA: Diagnosis not present

## 2019-10-04 DIAGNOSIS — N2581 Secondary hyperparathyroidism of renal origin: Secondary | ICD-10-CM | POA: Diagnosis not present

## 2019-10-10 ENCOUNTER — Encounter: Payer: Self-pay | Admitting: Internal Medicine

## 2019-10-16 DIAGNOSIS — E119 Type 2 diabetes mellitus without complications: Secondary | ICD-10-CM | POA: Diagnosis not present

## 2019-10-22 ENCOUNTER — Other Ambulatory Visit: Payer: Self-pay | Admitting: Nephrology

## 2019-10-22 DIAGNOSIS — N644 Mastodynia: Secondary | ICD-10-CM

## 2019-11-01 DIAGNOSIS — E1129 Type 2 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: Secondary | ICD-10-CM | POA: Diagnosis not present

## 2019-11-01 DIAGNOSIS — N186 End stage renal disease: Secondary | ICD-10-CM | POA: Diagnosis not present

## 2019-11-01 DIAGNOSIS — N2581 Secondary hyperparathyroidism of renal origin: Secondary | ICD-10-CM | POA: Diagnosis not present

## 2019-11-01 DIAGNOSIS — Z992 Dependence on renal dialysis: Secondary | ICD-10-CM | POA: Diagnosis not present

## 2019-11-01 DIAGNOSIS — T7840XA Allergy, unspecified, initial encounter: Secondary | ICD-10-CM | POA: Diagnosis not present

## 2019-11-01 DIAGNOSIS — D689 Coagulation defect, unspecified: Secondary | ICD-10-CM | POA: Diagnosis not present

## 2019-11-02 DIAGNOSIS — I150 Renovascular hypertension: Secondary | ICD-10-CM | POA: Diagnosis not present

## 2019-11-02 DIAGNOSIS — N186 End stage renal disease: Secondary | ICD-10-CM | POA: Diagnosis not present

## 2019-11-02 DIAGNOSIS — Z992 Dependence on renal dialysis: Secondary | ICD-10-CM | POA: Diagnosis not present

## 2019-11-03 DIAGNOSIS — N186 End stage renal disease: Secondary | ICD-10-CM | POA: Diagnosis not present

## 2019-11-03 DIAGNOSIS — I871 Compression of vein: Secondary | ICD-10-CM | POA: Diagnosis not present

## 2019-11-03 DIAGNOSIS — T82858A Stenosis of vascular prosthetic devices, implants and grafts, initial encounter: Secondary | ICD-10-CM | POA: Diagnosis not present

## 2019-11-03 DIAGNOSIS — Z992 Dependence on renal dialysis: Secondary | ICD-10-CM | POA: Diagnosis not present

## 2019-11-04 DIAGNOSIS — D689 Coagulation defect, unspecified: Secondary | ICD-10-CM | POA: Diagnosis not present

## 2019-11-04 DIAGNOSIS — R52 Pain, unspecified: Secondary | ICD-10-CM | POA: Diagnosis not present

## 2019-11-04 DIAGNOSIS — R197 Diarrhea, unspecified: Secondary | ICD-10-CM | POA: Diagnosis not present

## 2019-11-04 DIAGNOSIS — Z23 Encounter for immunization: Secondary | ICD-10-CM | POA: Diagnosis not present

## 2019-11-04 DIAGNOSIS — R300 Dysuria: Secondary | ICD-10-CM | POA: Diagnosis not present

## 2019-11-04 DIAGNOSIS — N2581 Secondary hyperparathyroidism of renal origin: Secondary | ICD-10-CM | POA: Diagnosis not present

## 2019-11-04 DIAGNOSIS — Z992 Dependence on renal dialysis: Secondary | ICD-10-CM | POA: Diagnosis not present

## 2019-11-04 DIAGNOSIS — N186 End stage renal disease: Secondary | ICD-10-CM | POA: Diagnosis not present

## 2019-11-04 DIAGNOSIS — E1129 Type 2 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: Secondary | ICD-10-CM | POA: Diagnosis not present

## 2019-11-05 DIAGNOSIS — Z992 Dependence on renal dialysis: Secondary | ICD-10-CM | POA: Diagnosis not present

## 2019-11-05 DIAGNOSIS — Z01810 Encounter for preprocedural cardiovascular examination: Secondary | ICD-10-CM | POA: Diagnosis not present

## 2019-11-05 DIAGNOSIS — I120 Hypertensive chronic kidney disease with stage 5 chronic kidney disease or end stage renal disease: Secondary | ICD-10-CM | POA: Diagnosis not present

## 2019-11-05 DIAGNOSIS — E1122 Type 2 diabetes mellitus with diabetic chronic kidney disease: Secondary | ICD-10-CM | POA: Diagnosis not present

## 2019-11-05 DIAGNOSIS — N186 End stage renal disease: Secondary | ICD-10-CM | POA: Diagnosis not present

## 2019-11-05 DIAGNOSIS — R69 Illness, unspecified: Secondary | ICD-10-CM | POA: Diagnosis not present

## 2019-11-05 DIAGNOSIS — E785 Hyperlipidemia, unspecified: Secondary | ICD-10-CM | POA: Diagnosis not present

## 2019-11-05 DIAGNOSIS — I480 Paroxysmal atrial fibrillation: Secondary | ICD-10-CM | POA: Diagnosis not present

## 2019-11-07 DIAGNOSIS — R69 Illness, unspecified: Secondary | ICD-10-CM | POA: Diagnosis not present

## 2019-11-12 ENCOUNTER — Encounter: Payer: Self-pay | Admitting: Endocrinology

## 2019-11-12 ENCOUNTER — Ambulatory Visit (INDEPENDENT_AMBULATORY_CARE_PROVIDER_SITE_OTHER): Payer: Medicare HMO | Admitting: Endocrinology

## 2019-11-12 ENCOUNTER — Other Ambulatory Visit: Payer: Self-pay

## 2019-11-12 VITALS — BP 117/70 | HR 93 | Ht 69.0 in | Wt 255.0 lb

## 2019-11-12 DIAGNOSIS — E119 Type 2 diabetes mellitus without complications: Secondary | ICD-10-CM

## 2019-11-12 DIAGNOSIS — R69 Illness, unspecified: Secondary | ICD-10-CM | POA: Diagnosis not present

## 2019-11-12 DIAGNOSIS — N186 End stage renal disease: Secondary | ICD-10-CM

## 2019-11-12 DIAGNOSIS — E1122 Type 2 diabetes mellitus with diabetic chronic kidney disease: Secondary | ICD-10-CM

## 2019-11-12 DIAGNOSIS — E1129 Type 2 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: Secondary | ICD-10-CM

## 2019-11-12 LAB — POCT GLYCOSYLATED HEMOGLOBIN (HGB A1C): Hemoglobin A1C: 8.4 % — AB (ref 4.0–5.6)

## 2019-11-12 MED ORDER — ONETOUCH VERIO VI STRP: 1.0000 | ORAL_STRIP | Freq: Two times a day (BID) | 12 refills | Status: DC

## 2019-11-12 MED ORDER — FREESTYLE LIBRE 14 DAY SENSOR MISC: 1.0000 | 3 refills | Status: AC

## 2019-11-12 MED ORDER — FREESTYLE LIBRE 14 DAY SENSOR MISC: 1.0000 | 3 refills | Status: DC

## 2019-11-12 NOTE — Progress Notes (Signed)
Subjective:    Patient ID: Darryl Kelley, male    DOB: 1965-10-23, 54 y.o.   MRN: [id number]  HPI Pt returns for f/u of diabetes mellitus: DM type: Insulin-requiring type 2 Dx'ed: 9242 Complications: ESRD (on HD), toe amputation, and PN.   Therapy: insulin since 2006 DKA: never Severe hypoglycemia: last episode of was in 2015.   Pancreatitis: never Pancreatic imaging: never.   SDOH: none Other: He eats 2 meals per day (9 AM and 6 PM); he takes multiple daily injections; he declines to add non-insulin rx.   Interval history: Pt says he still sonetimes misses the insulin.  I reviewed continuous glucose monitor data.  it is in general highest at HS, and lowest in the afternoon.  He has mild hypoglycemia approx once per week.  This happens in the middle of the night.  He stopped NPH, due to high cbg's.  He takes Novolog, 70 units with breakfast, and 90 units with supper.  He also takes basaglar, 70 units QHS.  He declines to resume NPH insulin.  Pt says he is having trouble affording continuous glucose monitor sensors.  I reviewed continuous glucose monitor data.  Glucose varies from 72-400.  Data go from 9AM-11:30PM.  It is in general lowest in the afternoon Past Medical History:  Diagnosis Date  . Chronic kidney disease   . Diabetes ([place])   . Dyslipidemia   . ESRD (end stage renal disease) on dialysis ([place])     No past surgical history on file.  Social History   Socioeconomic History  . Marital status: Single    Spouse name: Not on file  . Number of children: Not on file  . Years of education: Not on file  . Highest education level: Not on file  Occupational History  . Not on file  Tobacco Use  . Smoking status: Former Research scientist (life sciences)  . Smokeless tobacco: Never Used  Substance and Sexual Activity  . Alcohol use: Never  . Drug use: Never  . Sexual activity: Not Currently  Other Topics Concern  . Not on file  Social History Narrative  . Not on file   Social Determinants of  Health   Financial Resource Strain:   . Difficulty of Paying Living Expenses: Not on file  Food Insecurity:   . Worried About Charity fundraiser in the Last Year: Not on file  . Ran Out of Food in the Last Year: Not on file  Transportation Needs:   . Lack of Transportation (Medical): Not on file  . Lack of Transportation (Non-Medical): Not on file  Physical Activity:   . Days of Exercise per Week: Not on file  . Minutes of Exercise per Session: Not on file  Stress:   . Feeling of Stress : Not on file  Social Connections:   . Frequency of Communication with Friends and Family: Not on file  . Frequency of Social Gatherings with Friends and Family: Not on file  . Attends Religious Services: Not on file  . Active Member of Clubs or Organizations: Not on file  . Attends Archivist Meetings: Not on file  . Marital Status: Not on file  Intimate Partner Violence:   . Fear of Current or Ex-Partner: Not on file  . Emotionally Abused: Not on file  . Physically Abused: Not on file  . Sexually Abused: Not on file    Current Outpatient Medications on File Prior to Visit  Medication Sig Dispense Refill  . acetaminophen (TYLENOL)  650 MG CR tablet Take 650 mg by mouth as needed for pain.    Marland Kitchen amLODipine (NORVASC) 5 MG tablet Take 1 tablet (5 mg total) by mouth daily. 90 tablet 2  . aspirin 81 MG EC tablet Take 81 mg by mouth daily.     . B Complex-C-Folic Acid (RENAL) 1 MG CAPS Take 1 capsule by mouth daily.    . carvedilol (COREG) 25 MG tablet Take 12.5 mg by mouth 2 (two) times daily with a meal.     . clopidogrel (PLAVIX) 75 MG tablet Take 75 mg by mouth daily.     Marland Kitchen doxycycline (VIBRA-TABS) 100 MG tablet Take 100 mg by mouth 2 (two) times daily.     Marland Kitchen ezetimibe (ZETIA) 10 MG tablet Take 10 mg by mouth daily.     . fluticasone (FLONASE) 50 MCG/ACT nasal spray Place 2 sprays into both nostrils daily. 16 g 6  . heparin 1000 unit/mL SOLN injection Heparin Sodium (Porcine) 1,000  Units/mL Systemic    . Insulin Pen Needle (PEN NEEDLES) 31G X 8 MM MISC 1 each by Does not apply route 4 (four) times daily. 360 each 3  . LOPERAMIDE HCL PO Take 2 mg by mouth as needed.     . metoCLOPramide (REGLAN) 5 MG tablet TAKE 1 TABLET BY MOUTH IN THE MORNING,1 AT NOON, 1 IN THE EVENING, AND 1 AT BEDTIME. 120 tablet 1  . omeprazole (PRILOSEC) 20 MG capsule Take 20 mg by mouth daily.     . pregabalin (LYRICA) 150 MG capsule Take 1 capsule (150 mg total) by mouth 2 (two) times daily. 180 capsule 2  . rosuvastatin (CRESTOR) 10 MG tablet Take 1 tablet (10 mg total) by mouth daily. 90 tablet 3  . VASCEPA 1 g capsule TAKE 2 CAPSULES BY MOUTH TWICE A DAY 120 capsule 11  . VITAMIN D PO Take 0.75 mcg by mouth 3 (three) times a week.      No current facility-administered medications on file prior to visit.    Allergies  Allergen Reactions  . Atorvastatin Hives  . Rosuvastatin Rash    Family History  Problem Relation Age of Onset  . Diabetes Neg Hx     BP 117/70   Pulse 93   Ht 5\' 9"  (1.753 m)   Wt 255 lb (115.7 kg)   SpO2 97%   BMI 37.66 kg/m    Review of Systems     Objective:   Physical Exam VITAL SIGNS:  See vs page GENERAL: no distress Pulses: dorsalis pedis intact bilat.   MSK: no deformity of the feet.   CV: no leg edema.   Skin:  no ulcer on the feet.  normal color and temp on the feet.   Neuro: sensation is intact to touch on the feet.     Lab Results  Component Value Date   HGBA1C 8.4 (A) 11/12/2019       Assessment & Plan:  Insulin-requiring type 2 DM, with ESRD: uncontrolled. Hypoglycemia, due to insulin: this limits aggressiveness of glycemic control   Patient Instructions  check your blood sugar twice a day.  vary the time of day when you check, between before the 3 meals, and at bedtime.  also check if you have symptoms of your blood sugar being too high or too low.  please keep a record of the readings and bring it to your next appointment here  (or you can bring the meter itself).  You can write it on  any piece of paper.  please call us sooner if your blood sugar goes below 70, or if you have a lot of readings over 200.  To continue this insulin schedule, I need more information about your blood sugar--from the continuous glucose monitor or sticking your fingers.  Here is a new meter.  I have sent a prescription to your pharmacy, for strips and .continuous glucose monitor sensors.   Please come back for a follow-up appointment in 1 month.

## 2019-11-12 NOTE — Patient Instructions (Addendum)
check your blood sugar twice a day.  vary the time of day when you check, between before the 3 meals, and at bedtime.  also check if you have symptoms of your blood sugar being too high or too low.  please keep a record of the readings and bring it to your next appointment here (or you can bring the meter itself).  You can write it on any piece of paper.  please call us sooner if your blood sugar goes below 70, or if you have a lot of readings over 200.  To continue this insulin schedule, I need more information about your blood sugar--from the continuous glucose monitor or sticking your fingers.  Here is a new meter.  I have sent a prescription to your pharmacy, for strips and .continuous glucose monitor sensors.   Please come back for a follow-up appointment in 1 month.

## 2019-11-14 ENCOUNTER — Other Ambulatory Visit: Payer: Self-pay | Admitting: Endocrinology

## 2019-11-14 ENCOUNTER — Other Ambulatory Visit: Payer: Self-pay | Admitting: Family Medicine

## 2019-11-14 DIAGNOSIS — E119 Type 2 diabetes mellitus without complications: Secondary | ICD-10-CM | POA: Diagnosis not present

## 2019-11-14 NOTE — Telephone Encounter (Signed)
Rx Novolin N 100 Flexpen --is not on pt medication list. Please advise

## 2019-11-17 ENCOUNTER — Telehealth: Payer: Self-pay | Admitting: *Deleted

## 2019-11-17 NOTE — Telephone Encounter (Signed)
Received fax for pts Humalog and in pharmacy comments it is asking doctor to change to Novolog pen as this is covered.  Did not see Humalog on current med list.Brianne Maina [employer], CMA

## 2019-11-19 ENCOUNTER — Other Ambulatory Visit: Payer: Self-pay

## 2019-11-19 ENCOUNTER — Ambulatory Visit
Admission: RE | Admit: 2019-11-19 | Discharge: 2019-11-19 | Disposition: A | Discharge status: Home / Self Care | Payer: Medicare HMO | Source: Ambulatory Visit | Attending: Nephrology | Admitting: Nephrology

## 2019-11-19 ENCOUNTER — Ambulatory Visit: Payer: Medicare HMO

## 2019-11-19 DIAGNOSIS — N644 Mastodynia: Secondary | ICD-10-CM

## 2019-11-19 DIAGNOSIS — N62 Hypertrophy of breast: Secondary | ICD-10-CM | POA: Diagnosis not present

## 2019-11-20 ENCOUNTER — Other Ambulatory Visit: Payer: Self-pay | Admitting: Family Medicine

## 2019-11-20 ENCOUNTER — Other Ambulatory Visit: Payer: Self-pay | Admitting: Endocrinology

## 2019-11-30 ENCOUNTER — Other Ambulatory Visit: Payer: Self-pay | Admitting: Endocrinology

## 2019-11-30 NOTE — Telephone Encounter (Signed)
Can you confirm sig.

## 2019-12-02 DIAGNOSIS — I150 Renovascular hypertension: Secondary | ICD-10-CM | POA: Diagnosis not present

## 2019-12-02 DIAGNOSIS — D689 Coagulation defect, unspecified: Secondary | ICD-10-CM | POA: Diagnosis not present

## 2019-12-02 DIAGNOSIS — R52 Pain, unspecified: Secondary | ICD-10-CM | POA: Diagnosis not present

## 2019-12-02 DIAGNOSIS — E1129 Type 2 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: Secondary | ICD-10-CM | POA: Diagnosis not present

## 2019-12-02 DIAGNOSIS — Z23 Encounter for immunization: Secondary | ICD-10-CM | POA: Diagnosis not present

## 2019-12-02 DIAGNOSIS — Z992 Dependence on renal dialysis: Secondary | ICD-10-CM | POA: Diagnosis not present

## 2019-12-02 DIAGNOSIS — R300 Dysuria: Secondary | ICD-10-CM | POA: Diagnosis not present

## 2019-12-02 DIAGNOSIS — N2581 Secondary hyperparathyroidism of renal origin: Secondary | ICD-10-CM | POA: Diagnosis not present

## 2019-12-02 DIAGNOSIS — N186 End stage renal disease: Secondary | ICD-10-CM | POA: Diagnosis not present

## 2019-12-02 DIAGNOSIS — R197 Diarrhea, unspecified: Secondary | ICD-10-CM | POA: Diagnosis not present

## 2019-12-03 DIAGNOSIS — K7589 Other specified inflammatory liver diseases: Secondary | ICD-10-CM | POA: Diagnosis not present

## 2019-12-03 DIAGNOSIS — K7400 Hepatic fibrosis, unspecified: Secondary | ICD-10-CM | POA: Diagnosis not present

## 2019-12-03 DIAGNOSIS — K766 Portal hypertension: Secondary | ICD-10-CM | POA: Diagnosis not present

## 2019-12-03 DIAGNOSIS — K7402 Hepatic fibrosis, advanced fibrosis: Secondary | ICD-10-CM | POA: Diagnosis not present

## 2019-12-03 DIAGNOSIS — R69 Illness, unspecified: Secondary | ICD-10-CM | POA: Diagnosis not present

## 2019-12-04 DIAGNOSIS — Z992 Dependence on renal dialysis: Secondary | ICD-10-CM | POA: Diagnosis not present

## 2019-12-04 DIAGNOSIS — E1129 Type 2 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: Secondary | ICD-10-CM | POA: Diagnosis not present

## 2019-12-04 DIAGNOSIS — R52 Pain, unspecified: Secondary | ICD-10-CM | POA: Diagnosis not present

## 2019-12-04 DIAGNOSIS — D689 Coagulation defect, unspecified: Secondary | ICD-10-CM | POA: Diagnosis not present

## 2019-12-04 DIAGNOSIS — N2581 Secondary hyperparathyroidism of renal origin: Secondary | ICD-10-CM | POA: Diagnosis not present

## 2019-12-04 DIAGNOSIS — N186 End stage renal disease: Secondary | ICD-10-CM | POA: Diagnosis not present

## 2019-12-10 DIAGNOSIS — R69 Illness, unspecified: Secondary | ICD-10-CM | POA: Diagnosis not present

## 2019-12-13 ENCOUNTER — Other Ambulatory Visit: Payer: Self-pay | Admitting: Endocrinology

## 2019-12-15 ENCOUNTER — Encounter: Payer: Medicare HMO | Admitting: Internal Medicine

## 2019-12-17 ENCOUNTER — Ambulatory Visit: Payer: Medicare HMO | Admitting: Endocrinology

## 2019-12-19 ENCOUNTER — Other Ambulatory Visit: Payer: Self-pay | Admitting: Family Medicine

## 2019-12-24 ENCOUNTER — Ambulatory Visit: Payer: Medicare HMO | Admitting: Podiatry

## 2020-01-01 ENCOUNTER — Other Ambulatory Visit: Payer: Self-pay | Admitting: Endocrinology

## 2020-01-02 DIAGNOSIS — N186 End stage renal disease: Secondary | ICD-10-CM | POA: Diagnosis not present

## 2020-01-02 DIAGNOSIS — I150 Renovascular hypertension: Secondary | ICD-10-CM | POA: Diagnosis not present

## 2020-01-02 DIAGNOSIS — Z992 Dependence on renal dialysis: Secondary | ICD-10-CM | POA: Diagnosis not present

## 2020-01-04 DIAGNOSIS — N2581 Secondary hyperparathyroidism of renal origin: Secondary | ICD-10-CM | POA: Diagnosis not present

## 2020-01-04 DIAGNOSIS — E1129 Type 2 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: Secondary | ICD-10-CM | POA: Diagnosis not present

## 2020-01-04 DIAGNOSIS — N186 End stage renal disease: Secondary | ICD-10-CM | POA: Diagnosis not present

## 2020-01-04 DIAGNOSIS — D689 Coagulation defect, unspecified: Secondary | ICD-10-CM | POA: Diagnosis not present

## 2020-01-04 DIAGNOSIS — R52 Pain, unspecified: Secondary | ICD-10-CM | POA: Diagnosis not present

## 2020-01-04 DIAGNOSIS — Z992 Dependence on renal dialysis: Secondary | ICD-10-CM | POA: Diagnosis not present

## 2020-01-06 DIAGNOSIS — E1129 Type 2 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: Secondary | ICD-10-CM | POA: Diagnosis not present

## 2020-01-06 DIAGNOSIS — D689 Coagulation defect, unspecified: Secondary | ICD-10-CM | POA: Diagnosis not present

## 2020-01-06 DIAGNOSIS — Z992 Dependence on renal dialysis: Secondary | ICD-10-CM | POA: Diagnosis not present

## 2020-01-06 DIAGNOSIS — R52 Pain, unspecified: Secondary | ICD-10-CM | POA: Diagnosis not present

## 2020-01-06 DIAGNOSIS — N186 End stage renal disease: Secondary | ICD-10-CM | POA: Diagnosis not present

## 2020-01-06 DIAGNOSIS — N2581 Secondary hyperparathyroidism of renal origin: Secondary | ICD-10-CM | POA: Diagnosis not present

## 2020-01-08 DIAGNOSIS — R52 Pain, unspecified: Secondary | ICD-10-CM | POA: Diagnosis not present

## 2020-01-08 DIAGNOSIS — E1129 Type 2 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: Secondary | ICD-10-CM | POA: Diagnosis not present

## 2020-01-08 DIAGNOSIS — N2581 Secondary hyperparathyroidism of renal origin: Secondary | ICD-10-CM | POA: Diagnosis not present

## 2020-01-08 DIAGNOSIS — N186 End stage renal disease: Secondary | ICD-10-CM | POA: Diagnosis not present

## 2020-01-08 DIAGNOSIS — D689 Coagulation defect, unspecified: Secondary | ICD-10-CM | POA: Diagnosis not present

## 2020-01-08 DIAGNOSIS — Z992 Dependence on renal dialysis: Secondary | ICD-10-CM | POA: Diagnosis not present

## 2020-01-10 DIAGNOSIS — Z992 Dependence on renal dialysis: Secondary | ICD-10-CM | POA: Diagnosis not present

## 2020-01-10 DIAGNOSIS — R52 Pain, unspecified: Secondary | ICD-10-CM | POA: Diagnosis not present

## 2020-01-10 DIAGNOSIS — N186 End stage renal disease: Secondary | ICD-10-CM | POA: Diagnosis not present

## 2020-01-10 DIAGNOSIS — N2581 Secondary hyperparathyroidism of renal origin: Secondary | ICD-10-CM | POA: Diagnosis not present

## 2020-01-10 DIAGNOSIS — D689 Coagulation defect, unspecified: Secondary | ICD-10-CM | POA: Diagnosis not present

## 2020-01-10 DIAGNOSIS — E1129 Type 2 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: Secondary | ICD-10-CM | POA: Diagnosis not present

## 2020-01-13 ENCOUNTER — Other Ambulatory Visit: Payer: Self-pay | Admitting: Family Medicine

## 2020-01-13 DIAGNOSIS — N2581 Secondary hyperparathyroidism of renal origin: Secondary | ICD-10-CM | POA: Diagnosis not present

## 2020-01-13 DIAGNOSIS — N186 End stage renal disease: Secondary | ICD-10-CM | POA: Diagnosis not present

## 2020-01-13 DIAGNOSIS — R52 Pain, unspecified: Secondary | ICD-10-CM | POA: Diagnosis not present

## 2020-01-13 DIAGNOSIS — Z992 Dependence on renal dialysis: Secondary | ICD-10-CM | POA: Diagnosis not present

## 2020-01-13 DIAGNOSIS — D689 Coagulation defect, unspecified: Secondary | ICD-10-CM | POA: Diagnosis not present

## 2020-01-13 DIAGNOSIS — E1129 Type 2 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: Secondary | ICD-10-CM | POA: Diagnosis not present

## 2020-01-15 DIAGNOSIS — N186 End stage renal disease: Secondary | ICD-10-CM | POA: Diagnosis not present

## 2020-01-15 DIAGNOSIS — N2581 Secondary hyperparathyroidism of renal origin: Secondary | ICD-10-CM | POA: Diagnosis not present

## 2020-01-15 DIAGNOSIS — D689 Coagulation defect, unspecified: Secondary | ICD-10-CM | POA: Diagnosis not present

## 2020-01-15 DIAGNOSIS — E1129 Type 2 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: Secondary | ICD-10-CM | POA: Diagnosis not present

## 2020-01-15 DIAGNOSIS — Z992 Dependence on renal dialysis: Secondary | ICD-10-CM | POA: Diagnosis not present

## 2020-01-15 DIAGNOSIS — R52 Pain, unspecified: Secondary | ICD-10-CM | POA: Diagnosis not present

## 2020-01-17 ENCOUNTER — Other Ambulatory Visit: Payer: Self-pay | Admitting: Family Medicine

## 2020-01-17 DIAGNOSIS — N2581 Secondary hyperparathyroidism of renal origin: Secondary | ICD-10-CM | POA: Diagnosis not present

## 2020-01-17 DIAGNOSIS — N186 End stage renal disease: Secondary | ICD-10-CM | POA: Diagnosis not present

## 2020-01-17 DIAGNOSIS — Z992 Dependence on renal dialysis: Secondary | ICD-10-CM | POA: Diagnosis not present

## 2020-01-17 DIAGNOSIS — D689 Coagulation defect, unspecified: Secondary | ICD-10-CM | POA: Diagnosis not present

## 2020-01-17 DIAGNOSIS — E1129 Type 2 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: Secondary | ICD-10-CM | POA: Diagnosis not present

## 2020-01-17 DIAGNOSIS — R52 Pain, unspecified: Secondary | ICD-10-CM | POA: Diagnosis not present

## 2020-01-20 DIAGNOSIS — N186 End stage renal disease: Secondary | ICD-10-CM | POA: Diagnosis not present

## 2020-01-20 DIAGNOSIS — E1129 Type 2 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: Secondary | ICD-10-CM | POA: Diagnosis not present

## 2020-01-20 DIAGNOSIS — Z992 Dependence on renal dialysis: Secondary | ICD-10-CM | POA: Diagnosis not present

## 2020-01-20 DIAGNOSIS — N2581 Secondary hyperparathyroidism of renal origin: Secondary | ICD-10-CM | POA: Diagnosis not present

## 2020-01-20 DIAGNOSIS — D689 Coagulation defect, unspecified: Secondary | ICD-10-CM | POA: Diagnosis not present

## 2020-01-20 DIAGNOSIS — R52 Pain, unspecified: Secondary | ICD-10-CM | POA: Diagnosis not present

## 2020-01-22 DIAGNOSIS — E1129 Type 2 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: Secondary | ICD-10-CM | POA: Diagnosis not present

## 2020-01-22 DIAGNOSIS — D689 Coagulation defect, unspecified: Secondary | ICD-10-CM | POA: Diagnosis not present

## 2020-01-22 DIAGNOSIS — N2581 Secondary hyperparathyroidism of renal origin: Secondary | ICD-10-CM | POA: Diagnosis not present

## 2020-01-22 DIAGNOSIS — N186 End stage renal disease: Secondary | ICD-10-CM | POA: Diagnosis not present

## 2020-01-22 DIAGNOSIS — R52 Pain, unspecified: Secondary | ICD-10-CM | POA: Diagnosis not present

## 2020-01-22 DIAGNOSIS — Z992 Dependence on renal dialysis: Secondary | ICD-10-CM | POA: Diagnosis not present

## 2020-01-24 DIAGNOSIS — D689 Coagulation defect, unspecified: Secondary | ICD-10-CM | POA: Diagnosis not present

## 2020-01-24 DIAGNOSIS — N2581 Secondary hyperparathyroidism of renal origin: Secondary | ICD-10-CM | POA: Diagnosis not present

## 2020-01-24 DIAGNOSIS — Z992 Dependence on renal dialysis: Secondary | ICD-10-CM | POA: Diagnosis not present

## 2020-01-24 DIAGNOSIS — E1129 Type 2 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: Secondary | ICD-10-CM | POA: Diagnosis not present

## 2020-01-24 DIAGNOSIS — R52 Pain, unspecified: Secondary | ICD-10-CM | POA: Diagnosis not present

## 2020-01-24 DIAGNOSIS — N186 End stage renal disease: Secondary | ICD-10-CM | POA: Diagnosis not present

## 2020-01-27 DIAGNOSIS — R52 Pain, unspecified: Secondary | ICD-10-CM | POA: Diagnosis not present

## 2020-01-27 DIAGNOSIS — N186 End stage renal disease: Secondary | ICD-10-CM | POA: Diagnosis not present

## 2020-01-27 DIAGNOSIS — E1129 Type 2 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: Secondary | ICD-10-CM | POA: Diagnosis not present

## 2020-01-27 DIAGNOSIS — N2581 Secondary hyperparathyroidism of renal origin: Secondary | ICD-10-CM | POA: Diagnosis not present

## 2020-01-27 DIAGNOSIS — Z992 Dependence on renal dialysis: Secondary | ICD-10-CM | POA: Diagnosis not present

## 2020-01-27 DIAGNOSIS — D689 Coagulation defect, unspecified: Secondary | ICD-10-CM | POA: Diagnosis not present

## 2020-01-28 DIAGNOSIS — Z992 Dependence on renal dialysis: Secondary | ICD-10-CM | POA: Diagnosis not present

## 2020-01-28 DIAGNOSIS — I871 Compression of vein: Secondary | ICD-10-CM | POA: Diagnosis not present

## 2020-01-28 DIAGNOSIS — N186 End stage renal disease: Secondary | ICD-10-CM | POA: Diagnosis not present

## 2020-01-28 DIAGNOSIS — T82858A Stenosis of vascular prosthetic devices, implants and grafts, initial encounter: Secondary | ICD-10-CM | POA: Diagnosis not present

## 2020-01-29 DIAGNOSIS — D689 Coagulation defect, unspecified: Secondary | ICD-10-CM | POA: Diagnosis not present

## 2020-01-29 DIAGNOSIS — R52 Pain, unspecified: Secondary | ICD-10-CM | POA: Diagnosis not present

## 2020-01-29 DIAGNOSIS — Z992 Dependence on renal dialysis: Secondary | ICD-10-CM | POA: Diagnosis not present

## 2020-01-29 DIAGNOSIS — N2581 Secondary hyperparathyroidism of renal origin: Secondary | ICD-10-CM | POA: Diagnosis not present

## 2020-01-29 DIAGNOSIS — N186 End stage renal disease: Secondary | ICD-10-CM | POA: Diagnosis not present

## 2020-01-29 DIAGNOSIS — E1129 Type 2 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: Secondary | ICD-10-CM | POA: Diagnosis not present

## 2020-01-31 DIAGNOSIS — Z992 Dependence on renal dialysis: Secondary | ICD-10-CM | POA: Diagnosis not present

## 2020-01-31 DIAGNOSIS — E1129 Type 2 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: Secondary | ICD-10-CM | POA: Diagnosis not present

## 2020-01-31 DIAGNOSIS — N186 End stage renal disease: Secondary | ICD-10-CM | POA: Diagnosis not present

## 2020-01-31 DIAGNOSIS — D689 Coagulation defect, unspecified: Secondary | ICD-10-CM | POA: Diagnosis not present

## 2020-01-31 DIAGNOSIS — R52 Pain, unspecified: Secondary | ICD-10-CM | POA: Diagnosis not present

## 2020-01-31 DIAGNOSIS — N2581 Secondary hyperparathyroidism of renal origin: Secondary | ICD-10-CM | POA: Diagnosis not present

## 2020-02-02 DIAGNOSIS — N186 End stage renal disease: Secondary | ICD-10-CM | POA: Diagnosis not present

## 2020-02-02 DIAGNOSIS — Z992 Dependence on renal dialysis: Secondary | ICD-10-CM | POA: Diagnosis not present

## 2020-02-02 DIAGNOSIS — I150 Renovascular hypertension: Secondary | ICD-10-CM | POA: Diagnosis not present

## 2020-02-03 DIAGNOSIS — D689 Coagulation defect, unspecified: Secondary | ICD-10-CM | POA: Diagnosis not present

## 2020-02-03 DIAGNOSIS — N2581 Secondary hyperparathyroidism of renal origin: Secondary | ICD-10-CM | POA: Diagnosis not present

## 2020-02-03 DIAGNOSIS — R52 Pain, unspecified: Secondary | ICD-10-CM | POA: Diagnosis not present

## 2020-02-03 DIAGNOSIS — Z992 Dependence on renal dialysis: Secondary | ICD-10-CM | POA: Diagnosis not present

## 2020-02-03 DIAGNOSIS — N186 End stage renal disease: Secondary | ICD-10-CM | POA: Diagnosis not present

## 2020-02-05 DIAGNOSIS — D689 Coagulation defect, unspecified: Secondary | ICD-10-CM | POA: Diagnosis not present

## 2020-02-05 DIAGNOSIS — N2581 Secondary hyperparathyroidism of renal origin: Secondary | ICD-10-CM | POA: Diagnosis not present

## 2020-02-05 DIAGNOSIS — N186 End stage renal disease: Secondary | ICD-10-CM | POA: Diagnosis not present

## 2020-02-05 DIAGNOSIS — Z992 Dependence on renal dialysis: Secondary | ICD-10-CM | POA: Diagnosis not present

## 2020-02-05 DIAGNOSIS — R52 Pain, unspecified: Secondary | ICD-10-CM | POA: Diagnosis not present

## 2020-02-07 DIAGNOSIS — Z992 Dependence on renal dialysis: Secondary | ICD-10-CM | POA: Diagnosis not present

## 2020-02-07 DIAGNOSIS — N2581 Secondary hyperparathyroidism of renal origin: Secondary | ICD-10-CM | POA: Diagnosis not present

## 2020-02-07 DIAGNOSIS — N186 End stage renal disease: Secondary | ICD-10-CM | POA: Diagnosis not present

## 2020-02-07 DIAGNOSIS — R52 Pain, unspecified: Secondary | ICD-10-CM | POA: Diagnosis not present

## 2020-02-07 DIAGNOSIS — D689 Coagulation defect, unspecified: Secondary | ICD-10-CM | POA: Diagnosis not present

## 2020-02-10 DIAGNOSIS — N186 End stage renal disease: Secondary | ICD-10-CM | POA: Diagnosis not present

## 2020-02-10 DIAGNOSIS — R52 Pain, unspecified: Secondary | ICD-10-CM | POA: Diagnosis not present

## 2020-02-10 DIAGNOSIS — Z992 Dependence on renal dialysis: Secondary | ICD-10-CM | POA: Diagnosis not present

## 2020-02-10 DIAGNOSIS — D689 Coagulation defect, unspecified: Secondary | ICD-10-CM | POA: Diagnosis not present

## 2020-02-10 DIAGNOSIS — N2581 Secondary hyperparathyroidism of renal origin: Secondary | ICD-10-CM | POA: Diagnosis not present

## 2020-02-12 DIAGNOSIS — Z992 Dependence on renal dialysis: Secondary | ICD-10-CM | POA: Diagnosis not present

## 2020-02-12 DIAGNOSIS — N186 End stage renal disease: Secondary | ICD-10-CM | POA: Diagnosis not present

## 2020-02-12 DIAGNOSIS — D689 Coagulation defect, unspecified: Secondary | ICD-10-CM | POA: Diagnosis not present

## 2020-02-12 DIAGNOSIS — R52 Pain, unspecified: Secondary | ICD-10-CM | POA: Diagnosis not present

## 2020-02-12 DIAGNOSIS — N2581 Secondary hyperparathyroidism of renal origin: Secondary | ICD-10-CM | POA: Diagnosis not present

## 2020-02-14 DIAGNOSIS — N2581 Secondary hyperparathyroidism of renal origin: Secondary | ICD-10-CM | POA: Diagnosis not present

## 2020-02-14 DIAGNOSIS — D689 Coagulation defect, unspecified: Secondary | ICD-10-CM | POA: Diagnosis not present

## 2020-02-14 DIAGNOSIS — Z992 Dependence on renal dialysis: Secondary | ICD-10-CM | POA: Diagnosis not present

## 2020-02-14 DIAGNOSIS — R52 Pain, unspecified: Secondary | ICD-10-CM | POA: Diagnosis not present

## 2020-02-14 DIAGNOSIS — N186 End stage renal disease: Secondary | ICD-10-CM | POA: Diagnosis not present

## 2020-02-16 ENCOUNTER — Other Ambulatory Visit: Payer: Self-pay | Admitting: Family Medicine

## 2020-02-17 DIAGNOSIS — N186 End stage renal disease: Secondary | ICD-10-CM | POA: Diagnosis not present

## 2020-02-17 DIAGNOSIS — R52 Pain, unspecified: Secondary | ICD-10-CM | POA: Diagnosis not present

## 2020-02-17 DIAGNOSIS — D689 Coagulation defect, unspecified: Secondary | ICD-10-CM | POA: Diagnosis not present

## 2020-02-17 DIAGNOSIS — N2581 Secondary hyperparathyroidism of renal origin: Secondary | ICD-10-CM | POA: Diagnosis not present

## 2020-02-17 DIAGNOSIS — Z992 Dependence on renal dialysis: Secondary | ICD-10-CM | POA: Diagnosis not present

## 2020-02-19 DIAGNOSIS — N2581 Secondary hyperparathyroidism of renal origin: Secondary | ICD-10-CM | POA: Diagnosis not present

## 2020-02-19 DIAGNOSIS — Z992 Dependence on renal dialysis: Secondary | ICD-10-CM | POA: Diagnosis not present

## 2020-02-19 DIAGNOSIS — N186 End stage renal disease: Secondary | ICD-10-CM | POA: Diagnosis not present

## 2020-02-19 DIAGNOSIS — R52 Pain, unspecified: Secondary | ICD-10-CM | POA: Diagnosis not present

## 2020-02-19 DIAGNOSIS — D689 Coagulation defect, unspecified: Secondary | ICD-10-CM | POA: Diagnosis not present

## 2020-02-21 DIAGNOSIS — N186 End stage renal disease: Secondary | ICD-10-CM | POA: Diagnosis not present

## 2020-02-21 DIAGNOSIS — R52 Pain, unspecified: Secondary | ICD-10-CM | POA: Diagnosis not present

## 2020-02-21 DIAGNOSIS — Z992 Dependence on renal dialysis: Secondary | ICD-10-CM | POA: Diagnosis not present

## 2020-02-21 DIAGNOSIS — N2581 Secondary hyperparathyroidism of renal origin: Secondary | ICD-10-CM | POA: Diagnosis not present

## 2020-02-21 DIAGNOSIS — D689 Coagulation defect, unspecified: Secondary | ICD-10-CM | POA: Diagnosis not present

## 2020-02-24 DIAGNOSIS — Z992 Dependence on renal dialysis: Secondary | ICD-10-CM | POA: Diagnosis not present

## 2020-02-24 DIAGNOSIS — R52 Pain, unspecified: Secondary | ICD-10-CM | POA: Diagnosis not present

## 2020-02-24 DIAGNOSIS — N186 End stage renal disease: Secondary | ICD-10-CM | POA: Diagnosis not present

## 2020-02-24 DIAGNOSIS — N2581 Secondary hyperparathyroidism of renal origin: Secondary | ICD-10-CM | POA: Diagnosis not present

## 2020-02-24 DIAGNOSIS — D689 Coagulation defect, unspecified: Secondary | ICD-10-CM | POA: Diagnosis not present

## 2020-02-26 DIAGNOSIS — N186 End stage renal disease: Secondary | ICD-10-CM | POA: Diagnosis not present

## 2020-02-26 DIAGNOSIS — N2581 Secondary hyperparathyroidism of renal origin: Secondary | ICD-10-CM | POA: Diagnosis not present

## 2020-02-26 DIAGNOSIS — Z992 Dependence on renal dialysis: Secondary | ICD-10-CM | POA: Diagnosis not present

## 2020-02-26 DIAGNOSIS — D689 Coagulation defect, unspecified: Secondary | ICD-10-CM | POA: Diagnosis not present

## 2020-02-26 DIAGNOSIS — R52 Pain, unspecified: Secondary | ICD-10-CM | POA: Diagnosis not present

## 2020-02-28 DIAGNOSIS — Z992 Dependence on renal dialysis: Secondary | ICD-10-CM | POA: Diagnosis not present

## 2020-02-28 DIAGNOSIS — D689 Coagulation defect, unspecified: Secondary | ICD-10-CM | POA: Diagnosis not present

## 2020-02-28 DIAGNOSIS — N2581 Secondary hyperparathyroidism of renal origin: Secondary | ICD-10-CM | POA: Diagnosis not present

## 2020-02-28 DIAGNOSIS — R52 Pain, unspecified: Secondary | ICD-10-CM | POA: Diagnosis not present

## 2020-02-28 DIAGNOSIS — N186 End stage renal disease: Secondary | ICD-10-CM | POA: Diagnosis not present

## 2020-03-01 DIAGNOSIS — N186 End stage renal disease: Secondary | ICD-10-CM | POA: Diagnosis not present

## 2020-03-01 DIAGNOSIS — Z992 Dependence on renal dialysis: Secondary | ICD-10-CM | POA: Diagnosis not present

## 2020-03-01 DIAGNOSIS — I150 Renovascular hypertension: Secondary | ICD-10-CM | POA: Diagnosis not present

## 2020-03-02 DIAGNOSIS — N2581 Secondary hyperparathyroidism of renal origin: Secondary | ICD-10-CM | POA: Diagnosis not present

## 2020-03-02 DIAGNOSIS — D689 Coagulation defect, unspecified: Secondary | ICD-10-CM | POA: Diagnosis not present

## 2020-03-02 DIAGNOSIS — Z992 Dependence on renal dialysis: Secondary | ICD-10-CM | POA: Diagnosis not present

## 2020-03-02 DIAGNOSIS — N186 End stage renal disease: Secondary | ICD-10-CM | POA: Diagnosis not present

## 2020-03-02 DIAGNOSIS — R52 Pain, unspecified: Secondary | ICD-10-CM | POA: Diagnosis not present

## 2020-03-04 DIAGNOSIS — R52 Pain, unspecified: Secondary | ICD-10-CM | POA: Diagnosis not present

## 2020-03-04 DIAGNOSIS — N186 End stage renal disease: Secondary | ICD-10-CM | POA: Diagnosis not present

## 2020-03-04 DIAGNOSIS — Z992 Dependence on renal dialysis: Secondary | ICD-10-CM | POA: Diagnosis not present

## 2020-03-04 DIAGNOSIS — D689 Coagulation defect, unspecified: Secondary | ICD-10-CM | POA: Diagnosis not present

## 2020-03-04 DIAGNOSIS — N2581 Secondary hyperparathyroidism of renal origin: Secondary | ICD-10-CM | POA: Diagnosis not present

## 2020-03-06 DIAGNOSIS — N2581 Secondary hyperparathyroidism of renal origin: Secondary | ICD-10-CM | POA: Diagnosis not present

## 2020-03-06 DIAGNOSIS — R52 Pain, unspecified: Secondary | ICD-10-CM | POA: Diagnosis not present

## 2020-03-06 DIAGNOSIS — D689 Coagulation defect, unspecified: Secondary | ICD-10-CM | POA: Diagnosis not present

## 2020-03-06 DIAGNOSIS — Z992 Dependence on renal dialysis: Secondary | ICD-10-CM | POA: Diagnosis not present

## 2020-03-06 DIAGNOSIS — N186 End stage renal disease: Secondary | ICD-10-CM | POA: Diagnosis not present

## 2020-03-09 DIAGNOSIS — R52 Pain, unspecified: Secondary | ICD-10-CM | POA: Diagnosis not present

## 2020-03-09 DIAGNOSIS — N2581 Secondary hyperparathyroidism of renal origin: Secondary | ICD-10-CM | POA: Diagnosis not present

## 2020-03-09 DIAGNOSIS — N186 End stage renal disease: Secondary | ICD-10-CM | POA: Diagnosis not present

## 2020-03-09 DIAGNOSIS — D689 Coagulation defect, unspecified: Secondary | ICD-10-CM | POA: Diagnosis not present

## 2020-03-09 DIAGNOSIS — Z992 Dependence on renal dialysis: Secondary | ICD-10-CM | POA: Diagnosis not present

## 2020-03-11 DIAGNOSIS — R52 Pain, unspecified: Secondary | ICD-10-CM | POA: Diagnosis not present

## 2020-03-11 DIAGNOSIS — D689 Coagulation defect, unspecified: Secondary | ICD-10-CM | POA: Diagnosis not present

## 2020-03-11 DIAGNOSIS — N186 End stage renal disease: Secondary | ICD-10-CM | POA: Diagnosis not present

## 2020-03-11 DIAGNOSIS — Z992 Dependence on renal dialysis: Secondary | ICD-10-CM | POA: Diagnosis not present

## 2020-03-11 DIAGNOSIS — N2581 Secondary hyperparathyroidism of renal origin: Secondary | ICD-10-CM | POA: Diagnosis not present

## 2020-03-13 DIAGNOSIS — N2581 Secondary hyperparathyroidism of renal origin: Secondary | ICD-10-CM | POA: Diagnosis not present

## 2020-03-13 DIAGNOSIS — D689 Coagulation defect, unspecified: Secondary | ICD-10-CM | POA: Diagnosis not present

## 2020-03-13 DIAGNOSIS — N186 End stage renal disease: Secondary | ICD-10-CM | POA: Diagnosis not present

## 2020-03-13 DIAGNOSIS — Z992 Dependence on renal dialysis: Secondary | ICD-10-CM | POA: Diagnosis not present

## 2020-03-13 DIAGNOSIS — R52 Pain, unspecified: Secondary | ICD-10-CM | POA: Diagnosis not present

## 2020-03-16 ENCOUNTER — Encounter (HOSPITAL_COMMUNITY): Payer: Self-pay | Admitting: Internal Medicine

## 2020-03-16 ENCOUNTER — Inpatient Hospital Stay (HOSPITAL_COMMUNITY)
Admission: EM | Admit: 2020-03-16 | Discharge: 2020-03-21 | DRG: 637 | Disposition: A | Discharge status: Home / Self Care | Payer: Medicare HMO | Attending: Internal Medicine | Admitting: Internal Medicine

## 2020-03-16 ENCOUNTER — Emergency Department (HOSPITAL_COMMUNITY): Payer: Medicare HMO

## 2020-03-16 DIAGNOSIS — I120 Hypertensive chronic kidney disease with stage 5 chronic kidney disease or end stage renal disease: Secondary | ICD-10-CM | POA: Diagnosis not present

## 2020-03-16 DIAGNOSIS — K3184 Gastroparesis: Secondary | ICD-10-CM | POA: Diagnosis present

## 2020-03-16 DIAGNOSIS — Z7901 Long term (current) use of anticoagulants: Secondary | ICD-10-CM

## 2020-03-16 DIAGNOSIS — E782 Mixed hyperlipidemia: Secondary | ICD-10-CM | POA: Diagnosis not present

## 2020-03-16 DIAGNOSIS — E876 Hypokalemia: Secondary | ICD-10-CM | POA: Diagnosis not present

## 2020-03-16 DIAGNOSIS — I132 Hypertensive heart and chronic kidney disease with heart failure and with stage 5 chronic kidney disease, or end stage renal disease: Secondary | ICD-10-CM | POA: Diagnosis not present

## 2020-03-16 DIAGNOSIS — E1165 Type 2 diabetes mellitus with hyperglycemia: Secondary | ICD-10-CM

## 2020-03-16 DIAGNOSIS — I959 Hypotension, unspecified: Secondary | ICD-10-CM | POA: Diagnosis present

## 2020-03-16 DIAGNOSIS — B1920 Unspecified viral hepatitis C without hepatic coma: Secondary | ICD-10-CM | POA: Diagnosis present

## 2020-03-16 DIAGNOSIS — Z8611 Personal history of tuberculosis: Secondary | ICD-10-CM | POA: Diagnosis not present

## 2020-03-16 DIAGNOSIS — I4891 Unspecified atrial fibrillation: Secondary | ICD-10-CM | POA: Diagnosis not present

## 2020-03-16 DIAGNOSIS — E113593 Type 2 diabetes mellitus with proliferative diabetic retinopathy without macular edema, bilateral: Secondary | ICD-10-CM | POA: Diagnosis present

## 2020-03-16 DIAGNOSIS — I480 Paroxysmal atrial fibrillation: Secondary | ICD-10-CM | POA: Diagnosis not present

## 2020-03-16 DIAGNOSIS — N186 End stage renal disease: Secondary | ICD-10-CM | POA: Diagnosis present

## 2020-03-16 DIAGNOSIS — Z87891 Personal history of nicotine dependence: Secondary | ICD-10-CM

## 2020-03-16 DIAGNOSIS — E1143 Type 2 diabetes mellitus with diabetic autonomic (poly)neuropathy: Secondary | ICD-10-CM | POA: Diagnosis present

## 2020-03-16 DIAGNOSIS — I1310 Hypertensive heart and chronic kidney disease without heart failure, with stage 1 through stage 4 chronic kidney disease, or unspecified chronic kidney disease: Secondary | ICD-10-CM | POA: Diagnosis present

## 2020-03-16 DIAGNOSIS — E1110 Type 2 diabetes mellitus with ketoacidosis without coma: Secondary | ICD-10-CM | POA: Diagnosis not present

## 2020-03-16 DIAGNOSIS — I25119 Atherosclerotic heart disease of native coronary artery with unspecified angina pectoris: Secondary | ICD-10-CM | POA: Diagnosis not present

## 2020-03-16 DIAGNOSIS — E1122 Type 2 diabetes mellitus with diabetic chronic kidney disease: Secondary | ICD-10-CM | POA: Diagnosis present

## 2020-03-16 DIAGNOSIS — Q203 Discordant ventriculoarterial connection: Secondary | ICD-10-CM | POA: Diagnosis not present

## 2020-03-16 DIAGNOSIS — D631 Anemia in chronic kidney disease: Secondary | ICD-10-CM | POA: Diagnosis not present

## 2020-03-16 DIAGNOSIS — E1129 Type 2 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: Secondary | ICD-10-CM | POA: Diagnosis not present

## 2020-03-16 DIAGNOSIS — G9341 Metabolic encephalopathy: Secondary | ICD-10-CM | POA: Diagnosis not present

## 2020-03-16 DIAGNOSIS — Z7902 Long term (current) use of antithrombotics/antiplatelets: Secondary | ICD-10-CM

## 2020-03-16 DIAGNOSIS — I2510 Atherosclerotic heart disease of native coronary artery without angina pectoris: Secondary | ICD-10-CM | POA: Diagnosis not present

## 2020-03-16 DIAGNOSIS — Q248 Other specified congenital malformations of heart: Secondary | ICD-10-CM

## 2020-03-16 DIAGNOSIS — Z794 Long term (current) use of insulin: Secondary | ICD-10-CM

## 2020-03-16 DIAGNOSIS — R410 Disorientation, unspecified: Secondary | ICD-10-CM | POA: Diagnosis not present

## 2020-03-16 DIAGNOSIS — I10 Essential (primary) hypertension: Secondary | ICD-10-CM | POA: Diagnosis present

## 2020-03-16 DIAGNOSIS — N2581 Secondary hyperparathyroidism of renal origin: Secondary | ICD-10-CM | POA: Diagnosis present

## 2020-03-16 DIAGNOSIS — E1010 Type 1 diabetes mellitus with ketoacidosis without coma: Secondary | ICD-10-CM

## 2020-03-16 DIAGNOSIS — N250 Renal osteodystrophy: Secondary | ICD-10-CM | POA: Diagnosis not present

## 2020-03-16 DIAGNOSIS — Z992 Dependence on renal dialysis: Secondary | ICD-10-CM

## 2020-03-16 DIAGNOSIS — Z20822 Contact with and (suspected) exposure to covid-19: Secondary | ICD-10-CM | POA: Diagnosis present

## 2020-03-16 DIAGNOSIS — E1100 Type 2 diabetes mellitus with hyperosmolarity without nonketotic hyperglycemic-hyperosmolar coma (NKHHC): Secondary | ICD-10-CM | POA: Diagnosis not present

## 2020-03-16 DIAGNOSIS — Z79899 Other long term (current) drug therapy: Secondary | ICD-10-CM

## 2020-03-16 DIAGNOSIS — R0602 Shortness of breath: Secondary | ICD-10-CM | POA: Diagnosis not present

## 2020-03-16 DIAGNOSIS — Z9114 Patient's other noncompliance with medication regimen: Secondary | ICD-10-CM

## 2020-03-16 DIAGNOSIS — I160 Hypertensive urgency: Secondary | ICD-10-CM | POA: Diagnosis present

## 2020-03-16 DIAGNOSIS — J189 Pneumonia, unspecified organism: Secondary | ICD-10-CM | POA: Diagnosis present

## 2020-03-16 DIAGNOSIS — R4182 Altered mental status, unspecified: Secondary | ICD-10-CM | POA: Diagnosis not present

## 2020-03-16 DIAGNOSIS — M109 Gout, unspecified: Secondary | ICD-10-CM | POA: Diagnosis present

## 2020-03-16 DIAGNOSIS — Z888 Allergy status to other drugs, medicaments and biological substances status: Secondary | ICD-10-CM

## 2020-03-16 DIAGNOSIS — Z743 Need for continuous supervision: Secondary | ICD-10-CM | POA: Diagnosis not present

## 2020-03-16 DIAGNOSIS — I428 Other cardiomyopathies: Secondary | ICD-10-CM | POA: Diagnosis not present

## 2020-03-16 DIAGNOSIS — Z7982 Long term (current) use of aspirin: Secondary | ICD-10-CM

## 2020-03-16 DIAGNOSIS — A419 Sepsis, unspecified organism: Secondary | ICD-10-CM | POA: Diagnosis not present

## 2020-03-16 LAB — CBC WITH DIFFERENTIAL/PLATELET
Abs Immature Granulocytes: 0.1 10*3/uL — ABNORMAL HIGH (ref 0.00–0.07)
Basophils Absolute: 0 10*3/uL (ref 0.0–0.1)
Basophils Relative: 0 %
Eosinophils Absolute: 0 10*3/uL (ref 0.0–0.5)
Eosinophils Relative: 0 %
HCT: 33.7 % — ABNORMAL LOW (ref 39.0–52.0)
Hemoglobin: 11.7 g/dL — ABNORMAL LOW (ref 13.0–17.0)
Immature Granulocytes: 1 %
Lymphocytes Relative: 13 %
Lymphs Abs: 1.6 10*3/uL (ref 0.7–4.0)
MCH: 29.9 pg (ref 26.0–34.0)
MCHC: 34.7 g/dL (ref 30.0–36.0)
MCV: 86.2 fL (ref 80.0–100.0)
Monocytes Absolute: 0.7 10*3/uL (ref 0.1–1.0)
Monocytes Relative: 6 %
Neutro Abs: 10.1 10*3/uL — ABNORMAL HIGH (ref 1.7–7.7)
Neutrophils Relative %: 80 %
Platelets: 171 10*3/uL (ref 150–400)
RBC: 3.91 MIL/uL — ABNORMAL LOW (ref 4.22–5.81)
RDW: 11.9 % (ref 11.5–15.5)
WBC: 12.6 10*3/uL — ABNORMAL HIGH (ref 4.0–10.5)
nRBC: 0 % (ref 0.0–0.2)

## 2020-03-16 LAB — GLUCOSE, CAPILLARY
Glucose-Capillary: >600 mg/dL (ref 70–99)
Glucose-Capillary: >600 mg/dL (ref 70–99)
Glucose-Capillary: >600 mg/dL (ref 70–99)
Glucose-Capillary: >600 mg/dL (ref 70–99)
Glucose-Capillary: >600 mg/dL (ref 70–99)
Glucose-Capillary: >600 mg/dL (ref 70–99)
Glucose-Capillary: >600 mg/dL (ref 70–99)
Glucose-Capillary: >600 mg/dL (ref 70–99)
Glucose-Capillary: >600 mg/dL (ref 70–99)
Glucose-Capillary: >600 mg/dL (ref 70–99)
Glucose-Capillary: >600 mg/dL (ref 70–99)

## 2020-03-16 LAB — BLOOD GAS, VENOUS
Acid-base deficit: 2 mmol/L (ref 0.0–2.0)
Bicarbonate: 22.7 mmol/L (ref 20.0–28.0)
FIO2: 21
O2 Saturation: 87.9 %
Patient temperature: 37
pCO2, Ven: 41.5 mmHg — ABNORMAL LOW (ref 44.0–60.0)
pH, Ven: 7.358 (ref 7.250–7.430)
pO2, Ven: 55.9 mmHg — ABNORMAL HIGH (ref 32.0–45.0)

## 2020-03-16 LAB — BASIC METABOLIC PANEL
Anion gap: 24 — ABNORMAL HIGH (ref 5–15)
Anion gap: 20 — ABNORMAL HIGH (ref 5–15)
BUN: 72 mg/dL — ABNORMAL HIGH (ref 6–20)
BUN: 73 mg/dL — ABNORMAL HIGH (ref 6–20)
CO2: 19 mmol/L — ABNORMAL LOW (ref 22–32)
CO2: 22 mmol/L (ref 22–32)
Calcium: 8.4 mg/dL — ABNORMAL LOW (ref 8.9–10.3)
Calcium: 8.3 mg/dL — ABNORMAL LOW (ref 8.9–10.3)
Chloride: 82 mmol/L — ABNORMAL LOW (ref 98–111)
Chloride: 85 mmol/L — ABNORMAL LOW (ref 98–111)
Creatinine, Ser: 12.24 mg/dL — ABNORMAL HIGH (ref 0.61–1.24)
Creatinine, Ser: 12.21 mg/dL — ABNORMAL HIGH (ref 0.61–1.24)
GFR, Estimated: 4 mL/min — ABNORMAL LOW (ref >60)
GFR, Estimated: 4 mL/min — ABNORMAL LOW (ref >60)
Glucose, Bld: 1165 mg/dL (ref 70–99)
Glucose, Bld: 1098 mg/dL (ref 70–99)
Potassium: 4 mmol/L (ref 3.5–5.1)
Potassium: 3.8 mmol/L (ref 3.5–5.1)
Sodium: 125 mmol/L — ABNORMAL LOW (ref 135–145)
Sodium: 127 mmol/L — ABNORMAL LOW (ref 135–145)

## 2020-03-16 LAB — HIV ANTIBODY (ROUTINE TESTING W REFLEX): HIV Screen 4th Generation wRfx: NONREACTIVE

## 2020-03-16 LAB — I-STAT VENOUS BLOOD GAS, ED
Acid-base deficit: 6 mmol/L — ABNORMAL HIGH (ref 0.0–2.0)
Acid-base deficit: 5 mmol/L — ABNORMAL HIGH (ref 0.0–2.0)
Bicarbonate: 16.2 mmol/L — ABNORMAL LOW (ref 20.0–28.0)
Bicarbonate: 19.2 mmol/L — ABNORMAL LOW (ref 20.0–28.0)
Calcium, Ion: 0.9 mmol/L — ABNORMAL LOW (ref 1.15–1.40)
Calcium, Ion: 0.98 mmol/L — ABNORMAL LOW (ref 1.15–1.40)
HCT: 34 % — ABNORMAL LOW (ref 39.0–52.0)
HCT: 36 % — ABNORMAL LOW (ref 39.0–52.0)
Hemoglobin: 11.6 g/dL — ABNORMAL LOW (ref 13.0–17.0)
Hemoglobin: 12.2 g/dL — ABNORMAL LOW (ref 13.0–17.0)
O2 Saturation: 99 %
O2 Saturation: 96 %
Potassium: 4.3 mmol/L (ref 3.5–5.1)
Potassium: 5.3 mmol/L — ABNORMAL HIGH (ref 3.5–5.1)
Sodium: 124 mmol/L — ABNORMAL LOW (ref 135–145)
Sodium: 126 mmol/L — ABNORMAL LOW (ref 135–145)
TCO2: 17 mmol/L — ABNORMAL LOW (ref 22–32)
TCO2: 20 mmol/L — ABNORMAL LOW (ref 22–32)
pCO2, Ven: 24 mmHg — ABNORMAL LOW (ref 44.0–60.0)
pCO2, Ven: 33.4 mmHg — ABNORMAL LOW (ref 44.0–60.0)
pH, Ven: 7.438 — ABNORMAL HIGH (ref 7.250–7.430)
pH, Ven: 7.367 (ref 7.250–7.430)
pO2, Ven: 119 mmHg — ABNORMAL HIGH (ref 32.0–45.0)
pO2, Ven: 84 mmHg — ABNORMAL HIGH (ref 32.0–45.0)

## 2020-03-16 LAB — COMPREHENSIVE METABOLIC PANEL
ALT: 27 U/L (ref 0–44)
AST: 29 U/L (ref 15–41)
Albumin: 3.3 g/dL — ABNORMAL LOW (ref 3.5–5.0)
Alkaline Phosphatase: 85 U/L (ref 38–126)
Anion gap: 30 — ABNORMAL HIGH (ref 5–15)
BUN: 67 mg/dL — ABNORMAL HIGH (ref 6–20)
CO2: 16 mmol/L — ABNORMAL LOW (ref 22–32)
Calcium: 8.6 mg/dL — ABNORMAL LOW (ref 8.9–10.3)
Chloride: 81 mmol/L — ABNORMAL LOW (ref 98–111)
Creatinine, Ser: 12.16 mg/dL — ABNORMAL HIGH (ref 0.61–1.24)
GFR, Estimated: 4 mL/min — ABNORMAL LOW (ref >60)
Glucose, Bld: 1186 mg/dL (ref 70–99)
Potassium: 4.4 mmol/L (ref 3.5–5.1)
Sodium: 127 mmol/L — ABNORMAL LOW (ref 135–145)
Total Bilirubin: 2.7 mg/dL — ABNORMAL HIGH (ref 0.3–1.2)
Total Protein: 7 g/dL (ref 6.5–8.1)

## 2020-03-16 LAB — OSMOLALITY: Osmolality: 371 mOsm/kg (ref 275–295)

## 2020-03-16 LAB — RESP PANEL BY RT-PCR (FLU A&B, COVID) ARPGX2
Influenza A by PCR: NEGATIVE
Influenza B by PCR: NEGATIVE
SARS Coronavirus 2 by RT PCR: NEGATIVE

## 2020-03-16 LAB — CBG MONITORING, ED
Glucose-Capillary: >600 mg/dL (ref 70–99)
Glucose-Capillary: >600 mg/dL (ref 70–99)

## 2020-03-16 LAB — BETA-HYDROXYBUTYRIC ACID: Beta-Hydroxybutyric Acid: 6.74 mmol/L — ABNORMAL HIGH (ref 0.05–0.27)

## 2020-03-16 LAB — PROCALCITONIN: Procalcitonin: 1.31 ng/mL

## 2020-03-16 LAB — LACTIC ACID, PLASMA
Lactic Acid, Venous: 2.8 mmol/L (ref 0.5–1.9)
Lactic Acid, Venous: 2.8 mmol/L (ref 0.5–1.9)

## 2020-03-16 LAB — TROPONIN I (HIGH SENSITIVITY): Troponin I (High Sensitivity): 584 ng/L (ref <18)

## 2020-03-16 LAB — HEPARIN LEVEL (UNFRACTIONATED): Heparin Unfractionated: <0.1 IU/mL — ABNORMAL LOW (ref 0.30–0.70)

## 2020-03-16 LAB — AMMONIA: Ammonia: 30 umol/L (ref 9–35)

## 2020-03-16 MED ORDER — LACTATED RINGERS IV SOLN: INTRAVENOUS | Status: DC

## 2020-03-16 MED ORDER — CHLORHEXIDINE GLUCONATE CLOTH 2 % EX PADS
6.0000 | MEDICATED_PAD | Freq: Every day | CUTANEOUS | Status: DC
  Administered 2020-03-21: 6 via TOPICAL

## 2020-03-16 MED ORDER — SODIUM CHLORIDE 0.9 % IV SOLN
500.0000 mg | INTRAVENOUS | Status: DC
  Administered 2020-03-16 – 2020-03-18 (×3): 500 mg via INTRAVENOUS
  Filled 2020-03-16 (×4): qty 500

## 2020-03-16 MED ORDER — HEPARIN SODIUM (PORCINE) 5000 UNIT/ML IJ SOLN: 5000.0000 [IU] | Freq: Three times a day (TID) | INTRAMUSCULAR | Status: DC

## 2020-03-16 MED ORDER — HEPARIN SODIUM (PORCINE) 1000 UNIT/ML DIALYSIS
4000.0000 [IU] | Freq: Once | INTRAMUSCULAR | Status: AC
  Administered 2020-03-19: 4000 [IU] via INTRAVENOUS_CENTRAL

## 2020-03-16 MED ORDER — SODIUM CHLORIDE 0.9 % IV SOLN: 100.0000 mL | INTRAVENOUS | Status: DC | PRN

## 2020-03-16 MED ORDER — INSULIN REGULAR(HUMAN) IN NACL 100-0.9 UT/100ML-% IV SOLN: INTRAVENOUS | Status: DC

## 2020-03-16 MED ORDER — DEXTROSE 50 % IV SOLN: 0.0000 mL | INTRAVENOUS | Status: DC | PRN

## 2020-03-16 MED ORDER — DEXTROSE IN LACTATED RINGERS 5 % IV SOLN: INTRAVENOUS | Status: DC

## 2020-03-16 MED ORDER — LIDOCAINE-PRILOCAINE 2.5-2.5 % EX CREA
1.0000 "application " | TOPICAL_CREAM | CUTANEOUS | Status: DC | PRN
  Filled 2020-03-16: qty 5

## 2020-03-16 MED ORDER — PENTAFLUOROPROP-TETRAFLUOROETH EX AERO: 1.0000 "application " | INHALATION_SPRAY | CUTANEOUS | Status: DC | PRN

## 2020-03-16 MED ORDER — LABETALOL HCL 5 MG/ML IV SOLN
10.0000 mg | INTRAVENOUS | Status: DC | PRN
  Administered 2020-03-17 – 2020-03-18 (×4): 10 mg via INTRAVENOUS
  Filled 2020-03-16 (×4): qty 4

## 2020-03-16 MED ORDER — INSULIN REGULAR(HUMAN) IN NACL 100-0.9 UT/100ML-% IV SOLN
INTRAVENOUS | Status: DC
  Administered 2020-03-16: 6 [IU]/h via INTRAVENOUS
  Administered 2020-03-17: 7 [IU]/h via INTRAVENOUS
  Filled 2020-03-16 (×2): qty 100

## 2020-03-16 MED ORDER — DEXTROSE 50 % IV SOLN
0.0000 mL | INTRAVENOUS | Status: DC | PRN
Start: 2020-03-16 — End: 2020-03-21

## 2020-03-16 MED ORDER — METOCLOPRAMIDE HCL 5 MG/ML IJ SOLN
5.0000 mg | Freq: Three times a day (TID) | INTRAMUSCULAR | Status: DC
  Administered 2020-03-17 – 2020-03-19 (×7): 5 mg via INTRAVENOUS
  Filled 2020-03-16 (×7): qty 2

## 2020-03-16 MED ORDER — DEXMEDETOMIDINE HCL IN NACL 400 MCG/100ML IV SOLN
0.4000 ug/kg/h | INTRAVENOUS | Status: DC
  Filled 2020-03-16: qty 100

## 2020-03-16 MED ORDER — LACTATED RINGERS IV BOLUS: 20.0000 mL/kg | Freq: Once | INTRAVENOUS | Status: DC

## 2020-03-16 MED ORDER — SODIUM CHLORIDE 0.9 % IV SOLN
2.0000 g | INTRAVENOUS | Status: DC
  Administered 2020-03-16 – 2020-03-20 (×5): 2 g via INTRAVENOUS
  Filled 2020-03-16 (×5): qty 20

## 2020-03-16 MED ORDER — HEPARIN (PORCINE) 25000 UT/250ML-% IV SOLN
1550.0000 [IU]/h | INTRAVENOUS | Status: AC
  Administered 2020-03-16: 1400 [IU]/h via INTRAVENOUS
  Administered 2020-03-17 – 2020-03-19 (×3): 1550 [IU]/h via INTRAVENOUS
  Filled 2020-03-16 (×4): qty 250

## 2020-03-16 MED ORDER — POTASSIUM CHLORIDE 10 MEQ/100ML IV SOLN
10.0000 meq | INTRAVENOUS | Status: DC
  Filled 2020-03-16: qty 100

## 2020-03-16 MED ORDER — LIDOCAINE HCL (PF) 1 % IJ SOLN: 5.0000 mL | INTRAMUSCULAR | Status: DC | PRN

## 2020-03-16 MED ORDER — FAMOTIDINE IN NACL 20-0.9 MG/50ML-% IV SOLN
20.0000 mg | Freq: Two times a day (BID) | INTRAVENOUS | Status: DC
  Administered 2020-03-16: 20 mg via INTRAVENOUS
  Filled 2020-03-16 (×2): qty 50

## 2020-03-16 MED ORDER — CALCITRIOL 0.5 MCG PO CAPS
1.5000 ug | ORAL_CAPSULE | ORAL | Status: DC
  Administered 2020-03-20: 1.5 ug via ORAL
  Filled 2020-03-16 (×2): qty 3

## 2020-03-16 MED ORDER — LACTATED RINGERS IV BOLUS
1000.0000 mL | Freq: Once | INTRAVENOUS | Status: AC
  Administered 2020-03-16: 1000 mL via INTRAVENOUS

## 2020-03-16 MED ORDER — LACTATED RINGERS IV BOLUS: 1000.0000 mL | INTRAVENOUS | Status: AC

## 2020-03-16 NOTE — ED Notes (Signed)
Dr. Zenia Resides aware of glucose 1186

## 2020-03-16 NOTE — Progress Notes (Signed)
Critical Value: Serum Osm 371  Date and time results received: 03/16/20 9:06 PM   Name of Provider Notified: elink  Orders Received? Or Actions Taken? pending

## 2020-03-16 NOTE — Consult Note (Signed)
NAMEArdell Kelley, MRN:  [id number], DOB:  February 03, 1965, LOS: 0 ADMISSION DATE:  03/16/2020, CONSULTATION DATE:  03/16/20 REFERRING MD:  Zenia Resides - EM , CHIEF COMPLAINT:  AMS  Brief History:  55yo m ESRD missed HD, here with AMS, found to have Glu > 1000  History of Present Illness:  55 yo M PMH ESRD on HD, DM2, Hepatitis, Afib, Diastolic HF who presented to ED 3/15 with AMS. Pt missed HD 3/14, and was found today with glu > 600. In ED, Glu >1000. Na is 127, K 4.4 Cr 12, WBC 12, LA 2.8 VBG 7.3/24/119/17/6/16  PCCM asking to evaluate for admission to ICU   Patient c/o chest pain, vague then does not answer next couple questions. He is agitated continuously trying to adjust blankets. Admits to cough with undefined sputum production. AO to self only.  Past Medical History:  DM ESRD Hep C ?Endocarditis: can find no correlating evidence for this Diastolic HF Afib Gout HLD Pain  HTN  Significant Hospital Events:  3/15 presented to ED with AMS + Glu > 1000. Started  On abx, 1L LR, insulin gtt. PCCM asked to evaluate for admission  Consults:  Nephrology PCCM  Procedures:    Significant Diagnostic Tests:  3/15 CXR> RLL opacity   Micro Data:  COVID neg 3/15  Antimicrobials:  Ceftriaxone 3/15>> Azithromycin 3/15>>  Interim History / Subjective:  consulted  Objective   Blood pressure (!) 214/95, pulse (!) 102, temperature 98.9 F (37.2 C), temperature source Rectal, resp. rate (!) 29, SpO2 99 %.        Intake/Output Summary (Last 24 hours) at 03/16/2020 1622 Last data filed at 03/16/2020 1549 Gross per 24 hour  Intake 1098.95 ml  Output -  Net 1098.95 ml   There were no vitals filed for this visit.  Examination: Constitutional: Ill appearing man sitting in bed  Eyes: EOMI, pupils equal Ears, nose, mouth, and throat: MM dry, trachea midline Cardiovascular: Tachycardic, ext warm Respiratory: Mild tachypnea, clear lungs, no accessory muscle  use Gastrointestinal: soft, hypoactive bS Skin: No rashes, normal turgor Neurologic: he can move all 4 ext to command Psychiatric: AO to self only, very little insight  Pseuohyponatremia Compensated VBG Gap 30 Beta-hydroxybutyrate pending  Resolved Hospital Problem list   n/a  Assessment & Plan:  Acute metabolic encephalopathy- constellation of lab findings consistent with some combination of HHS and DKA.  Confounded by ESRD. 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  Best practice (evaluated daily)  Diet: npo Pain/Anxiety/Delirium protocol (if indicated): precedex VAP protocol (if indicated): n/a DVT prophylaxis: heparin GI prophylaxis: n/a Glucose control: endotool Mobility: BR Disposition:ICU given severity of encephalopathy  Goals of Care:  Last date of multidisciplinary goals of care discussion: tbd Family and staff present: tbd Summary of discussion: tbd Follow up goals of care discussion due:  tbd Code Status: full  Labs   CBC: Recent Labs  Lab 03/16/20 1334 03/16/20 1423  WBC 12.6*  --   NEUTROABS 10.1*  --   HGB 11.7* 11.6*  HCT 33.7* 34.0*  MCV 86.2  --   PLT 171  --     Basic Metabolic Panel: Recent Labs  Lab 03/16/20 1334 03/16/20 1423  NA 127* 124*  K 4.4 4.3  CL 81*  --    CO2 16*  --   GLUCOSE 1,186*  --   BUN 67*  --   CREATININE 12.16*  --   CALCIUM 8.6*  --    GFR: CrCl cannot be calculated (Unknown ideal weight.). Recent Labs  Lab 03/16/20 1334 03/16/20 1412  WBC 12.6*  --   LATICACIDVEN  --  2.8*    Liver Function Tests: Recent Labs  Lab 03/16/20 1334  AST 29  ALT 27  ALKPHOS 85  BILITOT 2.7*  PROT 7.0  ALBUMIN 3.3*   No results for input(s): LIPASE, AMYLASE in the last 168 hours. Recent Labs  Lab 03/16/20 1410  AMMONIA 30    ABG    Component Value Date/Time   HCO3 16.2 (L) 03/16/2020 1423   TCO2 17 (L) 03/16/2020 1423   ACIDBASEDEF 6.0 (H) 03/16/2020 1423   O2SAT 99.0 03/16/2020 1423     Coagulation Profile: No results for input(s): INR, PROTIME in the last 168 hours.  Cardiac Enzymes: No results for input(s): CKTOTAL, CKMB, CKMBINDEX, TROPONINI in the last 168 hours.  HbA1C: Hemoglobin A1C  Date/Time Value Ref Range Status  11/12/2019 02:47 PM 8.4 (A) 4.0 - 5.6 % Final  09/17/2019 02:51 PM 9.7 (A) 4.0 - 5.6 % Final    CBG: Recent Labs  Lab 03/16/20 1325 03/16/20 1547  GLUCAP >600* >600*    Review of Systems:    Positive Symptoms in bold:  Constitutional fevers, chills, weight loss, fatigue, anorexia, malaise  Eyes decreased vision, double vision, eye irritation  Ears, Nose, Mouth, Throat sore throat, trouble swallowing, sinus congestion  Cardiovascular chest pain, paroxysmal nocturnal dyspnea, lower ext edema, palpitations   Respiratory SOB, cough, DOE, hemoptysis, wheezing  Gastrointestinal nausea, vomiting, diarrhea  Genitourinary burning with urination, trouble urinating  Musculoskeletal joint aches, joint swelling, back pain  Integumentary  rashes, skin lesions  Neurological focal weakness, focal numbness, trouble speaking, headaches  Psychiatric depression, anxiety, confusion  Endocrine polyuria, polydipsia, cold intolerance, heat intolerance  Hematologic abnormal bruising, abnormal  bleeding, unexplained nose bleeds  Allergic/Immunologic recurrent infections, hives, swollen lymph nodes     Past Medical History:  He,  has a past medical history of Chronic kidney disease, Diabetes ([place]), Dyslipidemia, and ESRD (end stage renal disease) on dialysis ([place]).   Surgical History:  No past surgical history on file.   Social History:   reports that he has quit smoking. He has never used smokeless tobacco. He reports that he does not drink alcohol and does not use drugs.   Family History:  His family history is negative for Diabetes.   Allergies Allergies  Allergen Reactions  . Atorvastatin Hives  . Rosuvastatin Rash     Home Medications  Prior to Admission medications   Medication Sig Start Date End Date Taking? Authorizing Provider  acetaminophen (TYLENOL) 650 MG CR tablet Take 650 mg by mouth as needed for pain.    [provider]  amLODipine (NORVASC) 5 MG tablet Take 1 tablet (5 mg total) by mouth daily. 04/16/19   Gifford Shave, MD  apixaban (ELIQUIS) 5 MG TABS tablet Take by mouth.    [provider]  aspirin 81 MG EC tablet Take 81 mg by mouth daily.  02/04/19   [provider]  B Complex-C-Folic Acid (RENAL) 1 MG CAPS Take 1 capsule by mouth daily. 09/18/19   [provider]  carvedilol (COREG) 25 MG tablet Take 12.5 mg by mouth 2 (two) times daily with a meal.  02/04/19   [provider]  clopidogrel (PLAVIX) 75 MG tablet Take 75 mg by mouth daily.  02/04/19   [provider]  Continuous Blood Gluc Sensor (FREESTYLE LIBRE 14 DAY SENSOR) MISC 1 each by Does not apply route every 14 (fourteen) days. For use with continuous glucose monitoring system. Change every 14 days; E11.9 11/12/19   Renato Shin, MD  doxycycline (VIBRA-TABS) 100 MG tablet Take 100 mg by mouth 2 (two) times daily.  02/06/19   [provider]  ezetimibe (ZETIA) 10 MG tablet Take 10 mg by mouth daily.  02/04/19   [provider]   fluticasone (FLONASE) 50 MCG/ACT nasal spray Place 2 sprays into both nostrils daily. 04/16/19   Gifford Shave, MD  glucose blood (ONETOUCH VERIO) test strip 1 each by Other route in the morning and at bedtime. And lancets 2/day 11/12/19   Renato Shin, MD  heparin 1000 unit/mL SOLN injection Heparin Sodium (Porcine) 1,000 Units/mL Systemic 02/04/19 02/03/20  [provider]  icosapent Ethyl (VASCEPA) 1 g capsule Take 2 capsules (2 g total) by mouth 2 (two) times daily. 02/16/20   Gifford Shave, MD  Insulin Pen Needle (PEN NEEDLES) 31G X 8 MM MISC 1 each by Does not apply route 4 (four) times daily. 04/09/19   Renato Shin, MD  LOPERAMIDE HCL PO Take 2 mg by mouth as needed.  03/25/19 03/23/20  [provider]  metoCLOPramide (REGLAN) 5 MG tablet TAKE 1 TABLET BY MOUTH 4 TIMES A DAY ONCE IN THE MORNING, AT [place], [place], & AT BEDTIME 01/20/20   Gifford Shave, MD  NOVOLIN N FLEXPEN 100 UNIT/ML Kiwkpen INJECT 200 UNITS INTO THE SKIN EVERY MORNING 12/13/19   Renato Shin, MD  NOVOLOG FLEXPEN 100 UNIT/ML FlexPen INJECT 40 UNITS SUBQ 3 TIMES A DAY BEFORE MEALS 01/01/20   Renato Shin, MD  omeprazole (PRILOSEC) 20 MG capsule Take 20 mg by mouth daily.  02/04/19   [provider]  pregabalin (LYRICA) 150 MG capsule Take 1 capsule (150 mg total) by mouth 2 (two) times daily. 08/20/19   Gifford Shave, MD  rosuvastatin (CRESTOR) 10 MG tablet Take 1 tablet (10 mg total) by mouth daily. 04/17/19   Gifford Shave, MD  sevelamer carbonate (RENVELA) 800 MG tablet TAKE 2 TABLETS BY MOUTH 3 TIMES A DAY AND ONE WITH SNACK 11/14/19   Gifford Shave, MD  VITAMIN D PO Take 0.75 mcg by mouth 3 (three) times a week.  03/20/19 03/18/20  [provider]     Critical care time: 32 minutes

## 2020-03-16 NOTE — Consult Note (Addendum)
[facility] Renal Consultation Note    Indication for Consultation:  Management of ESRD/hemodialysis; anemia, hypertension/volume and secondary hyperparathyroidism PCP: [facility] Nephrologist: Dr. Joelyn Oms  HPI: Darryl Kelley is a 55 y.o. male with ESRD on hemodialysis T,Th, S at [facility] [address] PMH:  DMT2 with neuropathy, diabetic gastroparesis, HTN, diastolic dysfunction, Hx endocarditis with IVDA, and Hep C., anemia of chronic disease, SHPT. He is compliant with HD, does not miss treatments.  Last monthly labs 03/11/20 were at goal. Last HD 03/13/2020. He ran full treatment, left 0.9 kg under OP EDW. Per Dr. Adin Hector clinic note 03/11/20, he has been struggling with diabetic gastroparesis and was referred to GI. He also has had higher than normal BP in last month with antihypertensive meds adjusted at OP HD center X 2.   Apparently he did not show up to [facility] today for HD which was extremely unusual for him. A CPT went to his home to check on him and found him oriented only to himself with bizarre behavior. He was brought to ED via EMS for evaluation. Na 127 Na corrected for hyperglycemia 144,  K+ 4.4 SCr Lactic Acid 2.8, K+ 4.4  CO2 16 BS 1186 AG >30,  SCr 12.16 BUN 67 Ca 8.6 Bili-2.7 HGB 11.7 PLT 171, WBC 12.6 Ammonia 30. CXR with Ill-defined airspace opacity right lower lung region concerning for focus of pneumonia. Lungs elsewhere clear. Respiratory panel negative. Temp 98.9 BP 185/74 HR 102 RR 24. He appears  Appears slightly agitated as though he cannot get comfortable.He cannot answer questions, only says, "Doc, it's bad, it's bad." Discussed with ED MD, plan is to call for admission for DKA/PNA. He has been started on DKA protocol. BC drawn, ABX have been ordered. Critical Care will be consulted.   Past Medical History:  Diagnosis Date  . Chronic kidney disease   . Diabetes ([place])   . Dyslipidemia   . ESRD (end stage renal disease) on dialysis  ([place])    No past surgical history on file. Family History  Problem Relation Age of Onset  . Diabetes Neg Hx    Social History:  reports that he has quit smoking. He has never used smokeless tobacco. He reports that he does not drink alcohol and does not use drugs. Allergies  Allergen Reactions  . Atorvastatin Hives  . Rosuvastatin Rash   Prior to Admission medications   Medication Sig Start Date End Date Taking? Authorizing Provider  acetaminophen (TYLENOL) 650 MG CR tablet Take 650 mg by mouth as needed for pain.    [provider]  amLODipine (NORVASC) 5 MG tablet Take 1 tablet (5 mg total) by mouth daily. 04/16/19   Gifford Shave, MD  apixaban (ELIQUIS) 5 MG TABS tablet Take by mouth.    [provider]  aspirin 81 MG EC tablet Take 81 mg by mouth daily.  02/04/19   [provider]  B Complex-C-Folic Acid (RENAL) 1 MG CAPS Take 1 capsule by mouth daily. 09/18/19   [provider]  carvedilol (COREG) 25 MG tablet Take 12.5 mg by mouth 2 (two) times daily with a meal.  02/04/19   [provider]  clopidogrel (PLAVIX) 75 MG tablet Take 75 mg by mouth daily.  02/04/19   [provider]  Continuous Blood Gluc Sensor (FREESTYLE LIBRE 14 DAY SENSOR) MISC 1 each by Does not apply route every 14 (fourteen) days. For use with continuous glucose monitoring system. Change every 14 days; E11.9 11/12/19  Renato Shin, MD  doxycycline (VIBRA-TABS) 100 MG tablet Take 100 mg by mouth 2 (two) times daily.  02/06/19   [provider]  ezetimibe (ZETIA) 10 MG tablet Take 10 mg by mouth daily.  02/04/19   [provider]  fluticasone (FLONASE) 50 MCG/ACT nasal spray Place 2 sprays into both nostrils daily. 04/16/19   Gifford Shave, MD  glucose blood (ONETOUCH VERIO) test strip 1 each by Other route in the morning and at bedtime. And lancets 2/day 11/12/19   Renato Shin, MD  heparin 1000 unit/mL SOLN injection Heparin Sodium (Porcine)  1,000 Units/mL Systemic 02/04/19 02/03/20  [provider]  icosapent Ethyl (VASCEPA) 1 g capsule Take 2 capsules (2 g total) by mouth 2 (two) times daily. 02/16/20   Gifford Shave, MD  Insulin Pen Needle (PEN NEEDLES) 31G X 8 MM MISC 1 each by Does not apply route 4 (four) times daily. 04/09/19   Renato Shin, MD  LOPERAMIDE HCL PO Take 2 mg by mouth as needed.  03/25/19 03/23/20  [provider]  metoCLOPramide (REGLAN) 5 MG tablet TAKE 1 TABLET BY MOUTH 4 TIMES A DAY ONCE IN THE MORNING, AT [place], [place], & AT BEDTIME 01/20/20   Gifford Shave, MD  NOVOLIN N FLEXPEN 100 UNIT/ML Kiwkpen INJECT 200 UNITS INTO THE SKIN EVERY MORNING 12/13/19   Renato Shin, MD  NOVOLOG FLEXPEN 100 UNIT/ML FlexPen INJECT 40 UNITS SUBQ 3 TIMES A DAY BEFORE MEALS 01/01/20   Renato Shin, MD  omeprazole (PRILOSEC) 20 MG capsule Take 20 mg by mouth daily.  02/04/19   [provider]  pregabalin (LYRICA) 150 MG capsule Take 1 capsule (150 mg total) by mouth 2 (two) times daily. 08/20/19   Gifford Shave, MD  rosuvastatin (CRESTOR) 10 MG tablet Take 1 tablet (10 mg total) by mouth daily. 04/17/19   Gifford Shave, MD  sevelamer carbonate (RENVELA) 800 MG tablet TAKE 2 TABLETS BY MOUTH 3 TIMES A DAY AND ONE WITH SNACK 11/14/19   Gifford Shave, MD  VITAMIN D PO Take 0.75 mcg by mouth 3 (three) times a week.  03/20/19 03/18/20  [provider]   Current Facility-Administered Medications  Medication Dose Route Frequency Provider Last Rate Last Admin  . azithromycin (ZITHROMAX) 500 mg in sodium chloride 0.9 % 250 mL IVPB  500 mg Intravenous Q24H Lacretia Leigh, MD      . cefTRIAXone (ROCEPHIN) 2 g in sodium chloride 0.9 % 100 mL IVPB  2 g Intravenous Q24H Lacretia Leigh, MD 200 mL/hr at 03/16/20 1512 2 g at 03/16/20 1512  . lactated ringers infusion   Intravenous Continuous Lacretia Leigh, MD       Current Outpatient Medications  Medication Sig Dispense Refill  . acetaminophen (TYLENOL)  650 MG CR tablet Take 650 mg by mouth as needed for pain.    Marland Kitchen amLODipine (NORVASC) 5 MG tablet Take 1 tablet (5 mg total) by mouth daily. 90 tablet 2  . apixaban (ELIQUIS) 5 MG TABS tablet Take by mouth.    Marland Kitchen aspirin 81 MG EC tablet Take 81 mg by mouth daily.     . B Complex-C-Folic Acid (RENAL) 1 MG CAPS Take 1 capsule by mouth daily.    . carvedilol (COREG) 25 MG tablet Take 12.5 mg by mouth 2 (two) times daily with a meal.     . clopidogrel (PLAVIX) 75 MG tablet Take 75 mg by mouth daily.     . Continuous Blood Gluc Sensor (FREESTYLE LIBRE 14 DAY SENSOR) MISC  1 each by Does not apply route every 14 (fourteen) days. For use with continuous glucose monitoring system. Change every 14 days; E11.9 6 each 3  . doxycycline (VIBRA-TABS) 100 MG tablet Take 100 mg by mouth 2 (two) times daily.     Marland Kitchen ezetimibe (ZETIA) 10 MG tablet Take 10 mg by mouth daily.     . fluticasone (FLONASE) 50 MCG/ACT nasal spray Place 2 sprays into both nostrils daily. 16 g 6  . glucose blood (ONETOUCH VERIO) test strip 1 each by Other route in the morning and at bedtime. And lancets 2/day 200 each 12  . heparin 1000 unit/mL SOLN injection Heparin Sodium (Porcine) 1,000 Units/mL Systemic    . icosapent Ethyl (VASCEPA) 1 g capsule Take 2 capsules (2 g total) by mouth 2 (two) times daily. 180 capsule 0  . Insulin Pen Needle (PEN NEEDLES) 31G X 8 MM MISC 1 each by Does not apply route 4 (four) times daily. 360 each 3  . LOPERAMIDE HCL PO Take 2 mg by mouth as needed.     . metoCLOPramide (REGLAN) 5 MG tablet TAKE 1 TABLET BY MOUTH 4 TIMES A DAY ONCE IN THE MORNING, AT NOON, EVENING, & AT BEDTIME 120 tablet 1  . NOVOLIN N FLEXPEN 100 UNIT/ML Kiwkpen INJECT 200 UNITS INTO THE SKIN EVERY MORNING 210 mL 3  . NOVOLOG FLEXPEN 100 UNIT/ML FlexPen INJECT 40 UNITS SUBQ 3 TIMES A DAY BEFORE MEALS 15 mL 3  . omeprazole (PRILOSEC) 20 MG capsule Take 20 mg by mouth daily.     . pregabalin (LYRICA) 150 MG capsule Take 1 capsule (150 mg  total) by mouth 2 (two) times daily. 180 capsule 2  . rosuvastatin (CRESTOR) 10 MG tablet Take 1 tablet (10 mg total) by mouth daily. 90 tablet 3  . sevelamer carbonate (RENVELA) 800 MG tablet TAKE 2 TABLETS BY MOUTH 3 TIMES A DAY AND ONE WITH SNACK 210 tablet 1  . VITAMIN D PO Take 0.75 mcg by mouth 3 (three) times a week.      Labs: Basic Metabolic Panel: Recent Labs  Lab 03/16/20 1423  NA 124*  K 4.3   Liver Function Tests: No results for input(s): AST, ALT, ALKPHOS, BILITOT, PROT, ALBUMIN in the last 168 hours. No results for input(s): LIPASE, AMYLASE in the last 168 hours. Recent Labs  Lab 03/16/20 1410  AMMONIA 30   CBC: Recent Labs  Lab 03/16/20 1334 03/16/20 1423  WBC 12.6*  --   NEUTROABS 10.1*  --   HGB 11.7* 11.6*  HCT 33.7* 34.0*  MCV 86.2  --   PLT 171  --    Cardiac Enzymes: No results for input(s): CKTOTAL, CKMB, CKMBINDEX, TROPONINI in the last 168 hours. CBG: Recent Labs  Lab 03/16/20 1325  GLUCAP >600*   Iron Studies: No results for input(s): IRON, TIBC, TRANSFERRIN, FERRITIN in the last 72 hours. Studies/Results: DG Chest Port 1 View  Result Date: 03/16/2020 CLINICAL DATA:  Shortness of breath EXAM: PORTABLE CHEST 1 VIEW COMPARISON:  None FINDINGS: There is subtle ill-defined opacity in the right lower lobe region. The lungs elsewhere are clear. Heart is mildly enlarged with pulmonary vascularity normal. No adenopathy. No bone lesions. IMPRESSION: Ill-defined airspace opacity right lower lung region concerning for focus of pneumonia. Lungs elsewhere clear. Heart is slightly enlarged. No demonstrable adenopathy. Electronically Signed   By: Lowella Grip III M.D.   On: 03/16/2020 14:14    ROS: As per HPI otherwise negative.   Physical  Exam: Vitals:   03/16/20 1415 03/16/20 1430 03/16/20 1445 03/16/20 1515  BP: (!) 185/74 (!) 144/117 (!) 163/97 (!) 214/95  Pulse: 100 98 99 (!) 102  Resp: 18 (!) 29 (!) 26 (!) 29  Temp: 98.9 F (37.2 C)      TempSrc: Rectal     SpO2: 94% 90% 94% 99%     General: Well developed, well nourished male, slightly agitated.  Head: Normocephalic, atraumatic, sclera non-icteric, mucus membranes are dry.  Neck: Supple. JVD not elevated. Lungs: Bilateral breath sounds, decreased in bases, otherwise CTAB. Heart: RRR with S1 S2. No murmurs, rubs, or gallops appreciated. SR/ST on monitor. No ectopy.  Abdomen: Soft, non-tender, non-distended with normoactive bowel sounds. No rebound/guarding. No obvious abdominal masses. M-S:  Strength and tone appear normal for age. Lower extremities:without edema or ischemic changes, no open wounds  Neuro: Alert and oriented X 1. Moves all extremities spontaneously but not following commands. Psych:  Not able to answer questions appropriately at present.  Dialysis Access: L AVG + bruit  Dialysis Orders: [facility] T,Th,S 4 hrs 15 min 200NRe 500/500 2.0K/2.0 Ca UFP 4 AVG -Heparin 4000 units IV TIW -Calcitriol 1.5 mcg PO TIW  Assessment/Plan:    1. DKA-BS 1186 AG >30. Started on DKA protocol per ED MD.  2. RRL PNA-BC drawn, started on ABX. Per primary.  3. AMS in setting of probable PNA/hyperglycemia. Has not missed HD treatments other than today. Probably due to sepsis/DKA.   4.  ESRD - T,Th, S via AVG. He has no compelling HD at present. Will have HD in AM.  5.  Hypertension/volume  - Very hypertensive on admission. No evidence of volume excess by exam. On amlodipine 10 mg po q HS and Losartan 50 mg PO at home. Left under EDW last treatment. He appears to be losing body weight.  Will need to lower volume in HD tomorrow.  6.  Anemia  - HGB at goal. Last Iron panel at HD unit 03/11/20 replete. Follow HGB.  7.  Metabolic bone disease - NPO at present. Resume VDRA, binders when able to eat.  8.  Nutrition - NPO at present 9. H/O Hx endocarditis with IVDA 10. H/O Hep C.  11. H/O gastroparesis. Was referred by Dr. Joelyn Oms to GI 03/11/20. No notes in Epic that he has been to GI  yet.   [place] Ganesh H. Owens Shark, NP-C 03/16/2020, 3:29 PM  [employer] [phone]

## 2020-03-16 NOTE — ED Triage Notes (Signed)
Pt here via ems from home after pt did not go to dialysis today. Dialysis tech went to pt house, found pt alert to self. Pt CBG reading HI with ems. 210/*108, HR 90's.

## 2020-03-16 NOTE — ED Notes (Signed)
Lactic 2.8 per lab, providers notified

## 2020-03-16 NOTE — Progress Notes (Signed)
ANTICOAGULATION CONSULT NOTE - Initial Consult  Pharmacy Consult for Heparin Indication: atrial fibrillation  Allergies  Allergen Reactions  . Atorvastatin Hives  . Rosuvastatin Rash    Patient Measurements: Weight: 106 kg (233 lb 11 oz) Heparin Dosing Weight: 97 kg  Vital Signs: Temp: 98.5 F (36.9 C) (03/15 1957) Temp Source: Oral (03/15 1957) BP: 163/120 (03/15 1826) Pulse Rate: 99 (03/15 1730)  Labs: Recent Labs    03/16/20 1334 03/16/20 1423 03/16/20 1630  HGB 11.7* 11.6* 12.2*  HCT 33.7* 34.0* 36.0*  PLT 171  --   --   CREATININE 12.16*  --   --     Estimated Creatinine Clearance: 8.2 mL/min (A) (by C-G formula based on SCr of 12.16 mg/dL (H)).   Medical History: Past Medical History:  Diagnosis Date  . Chronic kidney disease   . Diabetes ([place])   . Dyslipidemia   . ESRD (end stage renal disease) on dialysis [facility])     Assessment: Pt is a 28 YOM presenting with AMS s/p missing dialysis yesterday. Pt has a PMH significant for ESRD on dialysis and atrial fibrillation. As patient is altered, it is unclear if he was still on Eliquis PTA, the dosing regimen, or last dose.Pharmacy has been consulted to initiate heparin. Given uncertainty of anticoagulation before admission and dialysis dependence, will obtain baseline heparin level and dose heparin conservatively for now, omitting a bolus dose.   Goal of Therapy:  Heparin level 0.3-0.7 units/ml Monitor platelets by anticoagulation protocol: Yes   Plan:  Start heparin infusion at 1400 units/hr Check baseline and 8h heparin level Daily heparin level and CBC while on heparin Monitor for s/sx of bleeding and follow up transition to oral anticoagulation   Jacobo Forest PharmD Candidate 2022 03/16/2020 8:24 PM

## 2020-03-16 NOTE — Progress Notes (Signed)
eLink Physician-Brief Progress Note Patient Name: Darryl Kelley DOB: 29-Jan-1965 MRN: [id number]   Date of Service  03/16/2020  HPI/Events of Note  Troponin -I 584, EKG without ischemic changes, patient  Asymptomatic, creatinine > 12.  eICU Interventions  Elevated Troponin without clinical or laboratory evidence of acute coronary syndrome, likely due to renal failure. Will check echocardiogram to verify absence of regional wall motion abnormalities.        Kerry Kass Gabryelle Whitmoyer 03/16/2020, 10:20 PM

## 2020-03-16 NOTE — ED Notes (Signed)
Got patient on the monitor did ekg shown to Dr Zenia Resides got patient into a gown patient is resting with call bell in reach

## 2020-03-16 NOTE — Progress Notes (Signed)
Bedside RN called to report beeLink Physician-Brief Progress Note Patient Name: Darryl Kelley DOB: 06-19-1965 MRN: [id number]   Date of Service  03/16/2020  HPI/Events of Note  Bedside RN called to report osmolality of 371.  eICU Interventions  Result noted.        Kerry Kass Ziasia Lenoir 03/16/2020, 9:21 PM

## 2020-03-16 NOTE — ED Provider Notes (Addendum)
[facility] EMERGENCY DEPARTMENT Provider Note   CSN: [id number] Arrival date & time: 03/16/20  1320     History Chief Complaint  Patient presents with  . Altered Mental Status  . Hyperglycemia    Darryl Kelley is a 55 y.o. male.  55 year old male with history of end-stage renal disease presents due to altered mental status and hyperglycemia.  Patient missed his normal dialysis treatment yesterday and today was found to be altered with blood sugar above 600.  Patient is oriented only to person and place at this time.  He denies any abdominal or chest discomfort.  Mild cough noted.  No further obtainable due to his current state        Past Medical History:  Diagnosis Date  . Chronic kidney disease   . Diabetes ([place])   . Dyslipidemia   . ESRD (end stage renal disease) on dialysis [facility])     Patient Active Problem List   Diagnosis Date Noted  . Anesthesia of skin 08/25/2019  . Moderate protein-calorie malnutrition ([place]) 06/27/2019  . Pain due to onychomycosis of toenails of both feet 06/25/2019  . Gout, unspecified 06/24/2019  . Hypoglycemia, unspecified 06/04/2019  . Proliferative diabetic retinopathy of right eye ([place]) 05/05/2019  . Proliferative diabetic retinopathy of left eye ([place]) 05/05/2019  . Nuclear sclerotic cataract of both eyes 04/21/2019  . Encounter to establish care with new doctor 04/16/2019  . Night sweats 04/16/2019  . Dyslipidemia   . Diarrhea, unspecified 03/25/2019  . Pain, unspecified 02/15/2019  . Type 2 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 ([place]) [id number]  . End stage renal disease ([place]) 02/03/2019  . Allergy, unspecified, initial encounter 02/03/2019  . Coagulation defect, unspecified ([place]) 02/03/2019  . Secondary hyperparathyroidism of renal origin ([place]) 02/03/2019  . Chronic hepatitis C without hepatic coma ([place]) 09/11/2018  . Hepatitis B core antibody positive 09/11/2018  . Chronic kidney  disease, stage 4 (severe) ([place]) 07/30/2018  . Endocarditis 07/30/2018  . Hypertension 07/30/2018  . LVH (left ventricular hypertrophy) 07/30/2018  . Nephrotic range proteinuria 07/30/2018  . Worsening renal function 07/30/2018  . Chronic diastolic heart failure ([place]) 07/26/2018  . Hypertensive heart and renal disease 07/26/2018  . Mixed hyperlipidemia 07/26/2018  . Paroxysmal atrial fibrillation ([place]) 07/26/2018    No past surgical history on file.     Family History  Problem Relation Age of Onset  . Diabetes Neg Hx     Social History   Tobacco Use  . Smoking status: Former Research scientist (life sciences)  . Smokeless tobacco: Never Used  Substance Use Topics  . Alcohol use: Never  . Drug use: Never    Home Medications Prior to Admission medications   Medication Sig Start Date End Date Taking? Authorizing Provider  acetaminophen (TYLENOL) 650 MG CR tablet Take 650 mg by mouth as needed for pain.    [provider]  amLODipine (NORVASC) 5 MG tablet Take 1 tablet (5 mg total) by mouth daily. 04/16/19   Gifford Shave, MD  apixaban (ELIQUIS) 5 MG TABS tablet Take by mouth.    [provider]  aspirin 81 MG EC tablet Take 81 mg by mouth daily.  02/04/19   [provider]  B Complex-C-Folic Acid (RENAL) 1 MG CAPS Take 1 capsule by mouth daily. 09/18/19   [provider]  carvedilol (COREG) 25 MG tablet Take 12.5 mg by mouth 2 (two) times daily with a meal.  02/04/19   [provider]  clopidogrel (PLAVIX) 75 MG  tablet Take 75 mg by mouth daily.  02/04/19   [provider]  Continuous Blood Gluc Sensor (FREESTYLE LIBRE 14 DAY SENSOR) MISC 1 each by Does not apply route every 14 (fourteen) days. For use with continuous glucose monitoring system. Change every 14 days; E11.9 11/12/19   Renato Shin, MD  doxycycline (VIBRA-TABS) 100 MG tablet Take 100 mg by mouth 2 (two) times daily.  02/06/19   [provider]  ezetimibe (ZETIA) 10 MG tablet Take 10 mg  by mouth daily.  02/04/19   [provider]  fluticasone (FLONASE) 50 MCG/ACT nasal spray Place 2 sprays into both nostrils daily. 04/16/19   Gifford Shave, MD  glucose blood (ONETOUCH VERIO) test strip 1 each by Other route in the morning and at bedtime. And lancets 2/day 11/12/19   Renato Shin, MD  heparin 1000 unit/mL SOLN injection Heparin Sodium (Porcine) 1,000 Units/mL Systemic 02/04/19 02/03/20  [provider]  icosapent Ethyl (VASCEPA) 1 g capsule Take 2 capsules (2 g total) by mouth 2 (two) times daily. 02/16/20   Gifford Shave, MD  Insulin Pen Needle (PEN NEEDLES) 31G X 8 MM MISC 1 each by Does not apply route 4 (four) times daily. 04/09/19   Renato Shin, MD  LOPERAMIDE HCL PO Take 2 mg by mouth as needed.  03/25/19 03/23/20  [provider]  metoCLOPramide (REGLAN) 5 MG tablet TAKE 1 TABLET BY MOUTH 4 TIMES A DAY ONCE IN THE MORNING, AT [place], [place], & AT BEDTIME 01/20/20   Gifford Shave, MD  NOVOLIN N FLEXPEN 100 UNIT/ML Kiwkpen INJECT 200 UNITS INTO THE SKIN EVERY MORNING 12/13/19   Renato Shin, MD  NOVOLOG FLEXPEN 100 UNIT/ML FlexPen INJECT 40 UNITS SUBQ 3 TIMES A DAY BEFORE MEALS 01/01/20   Renato Shin, MD  omeprazole (PRILOSEC) 20 MG capsule Take 20 mg by mouth daily.  02/04/19   [provider]  pregabalin (LYRICA) 150 MG capsule Take 1 capsule (150 mg total) by mouth 2 (two) times daily. 08/20/19   Gifford Shave, MD  rosuvastatin (CRESTOR) 10 MG tablet Take 1 tablet (10 mg total) by mouth daily. 04/17/19   Gifford Shave, MD  sevelamer carbonate (RENVELA) 800 MG tablet TAKE 2 TABLETS BY MOUTH 3 TIMES A DAY AND ONE WITH SNACK 11/14/19   Gifford Shave, MD  VITAMIN D PO Take 0.75 mcg by mouth 3 (three) times a week.  03/20/19 03/18/20  [provider]    Allergies    Atorvastatin and Rosuvastatin  Review of Systems   Review of Systems  Unable to perform ROS: Mental status change    Physical Exam Updated Vital Signs BP (!)  201/109 (BP Location: Right Arm)   Pulse 99   Temp 98.4 F (36.9 C) (Oral)   Resp 20   SpO2 97%   Physical Exam Vitals and nursing note reviewed.  Constitutional:      General: He is not in acute distress.    Appearance: Normal appearance. He is well-developed. He is not toxic-appearing.  HENT:     Head: Normocephalic and atraumatic.  Eyes:     General: Lids are normal.     Conjunctiva/sclera: Conjunctivae normal.     Pupils: Pupils are equal, round, and reactive to light.  Neck:     Thyroid: No thyroid mass.     Trachea: No tracheal deviation.  Cardiovascular:     Rate and Rhythm: Normal rate and regular rhythm.     Heart sounds: Normal heart sounds. No murmur heard.  No gallop.   Pulmonary:     Effort: Pulmonary effort is normal. No respiratory distress.     Breath sounds: Normal breath sounds. No stridor. No decreased breath sounds, wheezing, rhonchi or rales.  Abdominal:     General: Bowel sounds are normal. There is no distension.     Palpations: Abdomen is soft.     Tenderness: There is no abdominal tenderness. There is no rebound.  Musculoskeletal:        General: No tenderness. Normal range of motion.     Cervical back: Normal range of motion and neck supple.  Skin:    General: Skin is warm and dry.     Findings: No abrasion or rash.  Neurological:     Mental Status: He is alert. He is disoriented and confused.     GCS: GCS eye subscore is 4. GCS verbal subscore is 5. GCS motor subscore is 6.     Cranial Nerves: No cranial nerve deficit.     Sensory: No sensory deficit.  Psychiatric:        Attention and Perception: He is inattentive.        Mood and Affect: Affect is flat.     ED Results / Procedures / Treatments   Labs (all labs ordered are listed, but only abnormal results are displayed) Labs Reviewed  CBG MONITORING, ED - Abnormal; Notable for the following components:      Result Value   Glucose-Capillary >600 (*)    All other components within  normal limits  RESP PANEL BY RT-PCR (FLU A&B, COVID) ARPGX2  CULTURE, BLOOD (ROUTINE X 2)  CULTURE, BLOOD (ROUTINE X 2)  CBC WITH DIFFERENTIAL/PLATELET  COMPREHENSIVE METABOLIC PANEL  AMMONIA  BLOOD GAS, VENOUS  LACTIC ACID, PLASMA    EKG EKG Interpretation  Date/Time:  Tuesday March 16 2020 13:22:44 EDT Ventricular Rate:  99 PR Interval:    QRS Duration: 89 QT Interval:  373 QTC Calculation: 479 R Axis:   74 Text Interpretation: Sinus rhythm Probable left atrial enlargement Borderline prolonged QT interval No old tracing to compare Confirmed by Lacretia Leigh ([id number]) on 03/16/2020 1:34:48 PM   Radiology No results found.  Procedures Procedures   Medications Ordered in ED Medications  lactated ringers bolus 1,000 mL (has no administration in time range)    ED Course  I have reviewed the triage vital signs and the nursing notes.  Pertinent labs & imaging results that were available during my care of the patient were reviewed by me and considered in my medical decision making (see chart for details).    MDM Rules/Calculators/A&P                          Patient with evidence of pneumonia on chest x-ray.  Covid test is negative.  Sepsis protocol started.  Lactate is elevated here.  Blood cultures obtained.  Patient started on IV antibiotics for pneumonia.  Will start patient on insulin drip.  He will be admitted to the hospitalist service  CRITICAL CARE Performed by: Leota Jacobsen Total critical care time: 60 minutes Critical care time was exclusive of separately billable procedures and treating other patients. Critical care was necessary to treat or prevent imminent or life-threatening deterioration. Critical care was time spent personally by me on the following activities: development of treatment plan with patient and/or surrogate as well as nursing, discussions with consultants, evaluation of patient's response to treatment, examination of patient, obtaining  history  from patient or surrogate, ordering and performing treatments and interventions, ordering and review of laboratory studies, ordering and review of radiographic studies, pulse oximetry and re-evaluation of patient's condition. Final Clinical Impression(s) / ED Diagnoses Final diagnoses:  None    Rx / DC Orders ED Discharge Orders    None       Lacretia Leigh, MD 03/16/20 1550    Lacretia Leigh, MD 03/16/20 [phone]

## 2020-03-16 NOTE — H&P (Signed)
See consult note dated today. 

## 2020-03-17 ENCOUNTER — Inpatient Hospital Stay (HOSPITAL_COMMUNITY): Payer: Medicare HMO

## 2020-03-17 ENCOUNTER — Other Ambulatory Visit (HOSPITAL_COMMUNITY): Payer: Medicare HMO

## 2020-03-17 DIAGNOSIS — N186 End stage renal disease: Secondary | ICD-10-CM | POA: Diagnosis not present

## 2020-03-17 DIAGNOSIS — I4891 Unspecified atrial fibrillation: Secondary | ICD-10-CM | POA: Diagnosis not present

## 2020-03-17 DIAGNOSIS — E1165 Type 2 diabetes mellitus with hyperglycemia: Secondary | ICD-10-CM | POA: Diagnosis not present

## 2020-03-17 DIAGNOSIS — E1100 Type 2 diabetes mellitus with hyperosmolarity without nonketotic hyperglycemic-hyperosmolar coma (NKHHC): Secondary | ICD-10-CM | POA: Diagnosis not present

## 2020-03-17 LAB — BASIC METABOLIC PANEL
Anion gap: 19 — ABNORMAL HIGH (ref 5–15)
Anion gap: 17 — ABNORMAL HIGH (ref 5–15)
Anion gap: 12 (ref 5–15)
BUN: 75 mg/dL — ABNORMAL HIGH (ref 6–20)
BUN: 76 mg/dL — ABNORMAL HIGH (ref 6–20)
BUN: 39 mg/dL — ABNORMAL HIGH (ref 6–20)
CO2: 23 mmol/L (ref 22–32)
CO2: 26 mmol/L (ref 22–32)
CO2: 28 mmol/L (ref 22–32)
Calcium: 8.7 mg/dL — ABNORMAL LOW (ref 8.9–10.3)
Calcium: 8.7 mg/dL — ABNORMAL LOW (ref 8.9–10.3)
Calcium: 9 mg/dL (ref 8.9–10.3)
Chloride: 88 mmol/L — ABNORMAL LOW (ref 98–111)
Chloride: 92 mmol/L — ABNORMAL LOW (ref 98–111)
Chloride: 98 mmol/L (ref 98–111)
Creatinine, Ser: 12.09 mg/dL — ABNORMAL HIGH (ref 0.61–1.24)
Creatinine, Ser: 12.29 mg/dL — ABNORMAL HIGH (ref 0.61–1.24)
Creatinine, Ser: 7.35 mg/dL — ABNORMAL HIGH (ref 0.61–1.24)
GFR, Estimated: 4 mL/min — ABNORMAL LOW (ref >60)
GFR, Estimated: 4 mL/min — ABNORMAL LOW (ref >60)
GFR, Estimated: 8 mL/min — ABNORMAL LOW (ref >60)
Glucose, Bld: 907 mg/dL (ref 70–99)
Glucose, Bld: 615 mg/dL (ref 70–99)
Glucose, Bld: 211 mg/dL — ABNORMAL HIGH (ref 70–99)
Potassium: 3.5 mmol/L (ref 3.5–5.1)
Potassium: 3 mmol/L — ABNORMAL LOW (ref 3.5–5.1)
Potassium: 3 mmol/L — ABNORMAL LOW (ref 3.5–5.1)
Sodium: 130 mmol/L — ABNORMAL LOW (ref 135–145)
Sodium: 135 mmol/L (ref 135–145)
Sodium: 138 mmol/L (ref 135–145)

## 2020-03-17 LAB — CBC
HCT: 29 % — ABNORMAL LOW (ref 39.0–52.0)
HCT: 35.8 % — ABNORMAL LOW (ref 39.0–52.0)
Hemoglobin: 10.8 g/dL — ABNORMAL LOW (ref 13.0–17.0)
Hemoglobin: 12.8 g/dL — ABNORMAL LOW (ref 13.0–17.0)
MCH: 30.1 pg (ref 26.0–34.0)
MCH: 29.3 pg (ref 26.0–34.0)
MCHC: 37.2 g/dL — ABNORMAL HIGH (ref 30.0–36.0)
MCHC: 35.8 g/dL (ref 30.0–36.0)
MCV: 80.8 fL (ref 80.0–100.0)
MCV: 81.9 fL (ref 80.0–100.0)
Platelets: 136 10*3/uL — ABNORMAL LOW (ref 150–400)
Platelets: 159 10*3/uL (ref 150–400)
RBC: 3.59 MIL/uL — ABNORMAL LOW (ref 4.22–5.81)
RBC: 4.37 MIL/uL (ref 4.22–5.81)
RDW: 11.3 % — ABNORMAL LOW (ref 11.5–15.5)
RDW: 11.5 % (ref 11.5–15.5)
WBC: 8.9 10*3/uL (ref 4.0–10.5)
WBC: 11.1 10*3/uL — ABNORMAL HIGH (ref 4.0–10.5)
nRBC: 0 % (ref 0.0–0.2)
nRBC: 0 % (ref 0.0–0.2)

## 2020-03-17 LAB — GLUCOSE, CAPILLARY
Glucose-Capillary: 161 mg/dL — ABNORMAL HIGH (ref 70–99)
Glucose-Capillary: 165 mg/dL — ABNORMAL HIGH (ref 70–99)
Glucose-Capillary: 177 mg/dL — ABNORMAL HIGH (ref 70–99)
Glucose-Capillary: 183 mg/dL — ABNORMAL HIGH (ref 70–99)
Glucose-Capillary: 208 mg/dL — ABNORMAL HIGH (ref 70–99)
Glucose-Capillary: 213 mg/dL — ABNORMAL HIGH (ref 70–99)
Glucose-Capillary: 218 mg/dL — ABNORMAL HIGH (ref 70–99)
Glucose-Capillary: 226 mg/dL — ABNORMAL HIGH (ref 70–99)
Glucose-Capillary: 240 mg/dL — ABNORMAL HIGH (ref 70–99)
Glucose-Capillary: 263 mg/dL — ABNORMAL HIGH (ref 70–99)
Glucose-Capillary: 300 mg/dL — ABNORMAL HIGH (ref 70–99)
Glucose-Capillary: 384 mg/dL — ABNORMAL HIGH (ref 70–99)
Glucose-Capillary: 487 mg/dL — ABNORMAL HIGH (ref 70–99)
Glucose-Capillary: 487 mg/dL — ABNORMAL HIGH (ref 70–99)
Glucose-Capillary: 509 mg/dL (ref 70–99)
Glucose-Capillary: 571 mg/dL (ref 70–99)
Glucose-Capillary: >600 mg/dL (ref 70–99)
Glucose-Capillary: >600 mg/dL (ref 70–99)
Glucose-Capillary: >600 mg/dL (ref 70–99)
Glucose-Capillary: >600 mg/dL (ref 70–99)
Glucose-Capillary: >600 mg/dL (ref 70–99)
Glucose-Capillary: >600 mg/dL (ref 70–99)
Glucose-Capillary: >600 mg/dL (ref 70–99)
Glucose-Capillary: >600 mg/dL (ref 70–99)

## 2020-03-17 LAB — RENAL FUNCTION PANEL
Albumin: 3.1 g/dL — ABNORMAL LOW (ref 3.5–5.0)
Anion gap: 14 (ref 5–15)
BUN: 37 mg/dL — ABNORMAL HIGH (ref 6–20)
CO2: 26 mmol/L (ref 22–32)
Calcium: 8.9 mg/dL (ref 8.9–10.3)
Chloride: 97 mmol/L — ABNORMAL LOW (ref 98–111)
Creatinine, Ser: 7.94 mg/dL — ABNORMAL HIGH (ref 0.61–1.24)
GFR, Estimated: 7 mL/min — ABNORMAL LOW (ref >60)
Glucose, Bld: 223 mg/dL — ABNORMAL HIGH (ref 70–99)
Phosphorus: 2.8 mg/dL (ref 2.5–4.6)
Potassium: 2.9 mmol/L — ABNORMAL LOW (ref 3.5–5.1)
Sodium: 137 mmol/L (ref 135–145)

## 2020-03-17 LAB — HEPARIN LEVEL (UNFRACTIONATED): Heparin Unfractionated: 0.28 IU/mL — ABNORMAL LOW (ref 0.30–0.70)

## 2020-03-17 LAB — MRSA PCR SCREENING: MRSA by PCR: NEGATIVE

## 2020-03-17 LAB — LACTIC ACID, PLASMA: Lactic Acid, Venous: 1.6 mmol/L (ref 0.5–1.9)

## 2020-03-17 LAB — BETA-HYDROXYBUTYRIC ACID
Beta-Hydroxybutyric Acid: 1.4 mmol/L — ABNORMAL HIGH (ref 0.05–0.27)
Beta-Hydroxybutyric Acid: 0.09 mmol/L (ref 0.05–0.27)

## 2020-03-17 MED ORDER — DIGOXIN 0.25 MG/ML IJ SOLN
0.5000 mg | Freq: Once | INTRAMUSCULAR | Status: AC
  Administered 2020-03-17: 0.5 mg via INTRAVENOUS
  Filled 2020-03-17: qty 2

## 2020-03-17 MED ORDER — HEPARIN SODIUM (PORCINE) 1000 UNIT/ML IJ SOLN
INTRAMUSCULAR | Status: AC
  Filled 2020-03-17: qty 4

## 2020-03-17 MED ORDER — ORAL CARE MOUTH RINSE
15.0000 mL | Freq: Two times a day (BID) | OROMUCOSAL | Status: DC
  Administered 2020-03-17 – 2020-03-21 (×7): 15 mL via OROMUCOSAL

## 2020-03-17 MED ORDER — DILTIAZEM HCL-DEXTROSE 125-5 MG/125ML-% IV SOLN (PREMIX): 5.0000 mg/h | INTRAVENOUS | Status: DC

## 2020-03-17 MED ORDER — CARVEDILOL 12.5 MG PO TABS
12.5000 mg | ORAL_TABLET | Freq: Two times a day (BID) | ORAL | Status: DC
  Administered 2020-03-18: 12.5 mg via ORAL
  Filled 2020-03-17: qty 1

## 2020-03-17 MED ORDER — GABAPENTIN 250 MG/5ML PO SOLN
150.0000 mg | Freq: Every day | ORAL | Status: DC
  Administered 2020-03-17: 150 mg via ORAL
  Filled 2020-03-17 (×2): qty 4

## 2020-03-17 MED ORDER — ONDANSETRON HCL 4 MG/2ML IJ SOLN
4.0000 mg | Freq: Three times a day (TID) | INTRAMUSCULAR | Status: DC | PRN
  Administered 2020-03-17 – 2020-03-19 (×3): 4 mg via INTRAVENOUS
  Filled 2020-03-17 (×4): qty 2

## 2020-03-17 MED ORDER — CARVEDILOL 12.5 MG PO TABS
6.2500 mg | ORAL_TABLET | Freq: Two times a day (BID) | ORAL | Status: DC
  Administered 2020-03-17: 6.25 mg via ORAL
  Filled 2020-03-17: qty 1

## 2020-03-17 MED ORDER — DILTIAZEM LOAD VIA INFUSION
10.0000 mg | Freq: Once | INTRAVENOUS | Status: AC
  Administered 2020-03-17: 10 mg via INTRAVENOUS
  Filled 2020-03-17: qty 10

## 2020-03-17 MED ORDER — METOPROLOL TARTRATE 5 MG/5ML IV SOLN
5.0000 mg | INTRAVENOUS | Status: DC | PRN
  Administered 2020-03-17 (×2): 5 mg via INTRAVENOUS
  Filled 2020-03-17 (×2): qty 5

## 2020-03-17 MED ORDER — INSULIN NPH (HUMAN) (ISOPHANE) 100 UNIT/ML ~~LOC~~ SUSP
50.0000 [IU] | Freq: Two times a day (BID) | SUBCUTANEOUS | Status: DC
  Administered 2020-03-17 – 2020-03-18 (×3): 50 [IU] via SUBCUTANEOUS
  Filled 2020-03-17: qty 10

## 2020-03-17 MED ORDER — FAMOTIDINE 20 MG PO TABS
20.0000 mg | ORAL_TABLET | Freq: Every day | ORAL | Status: DC
  Administered 2020-03-18 – 2020-03-21 (×4): 20 mg via ORAL
  Filled 2020-03-17 (×4): qty 1

## 2020-03-17 MED ORDER — POTASSIUM CHLORIDE CRYS ER 20 MEQ PO TBCR
20.0000 meq | EXTENDED_RELEASE_TABLET | Freq: Once | ORAL | Status: AC
  Administered 2020-03-17: 20 meq via ORAL
  Filled 2020-03-17: qty 1

## 2020-03-17 MED ORDER — DILTIAZEM HCL-DEXTROSE 125-5 MG/125ML-% IV SOLN (PREMIX)
5.0000 mg/h | INTRAVENOUS | Status: DC
  Administered 2020-03-17: 7.5 mg/h via INTRAVENOUS
  Administered 2020-03-17: 5 mg/h via INTRAVENOUS
  Filled 2020-03-17 (×3): qty 125

## 2020-03-17 MED ORDER — METOPROLOL TARTRATE 5 MG/5ML IV SOLN
INTRAVENOUS | Status: AC
  Administered 2020-03-17: 5 mg via INTRAVENOUS
  Filled 2020-03-17: qty 5

## 2020-03-17 MED ORDER — DIGOXIN 0.25 MG/ML IJ SOLN
0.2500 mg | Freq: Four times a day (QID) | INTRAMUSCULAR | Status: AC
  Administered 2020-03-17 – 2020-03-18 (×2): 0.25 mg via INTRAVENOUS
  Filled 2020-03-17 (×2): qty 2

## 2020-03-17 MED ORDER — INSULIN ASPART 100 UNIT/ML ~~LOC~~ SOLN
2.0000 [IU] | Freq: Three times a day (TID) | SUBCUTANEOUS | Status: DC
  Administered 2020-03-17 – 2020-03-18 (×4): 2 [IU] via SUBCUTANEOUS

## 2020-03-17 MED ORDER — DILTIAZEM HCL 25 MG/5ML IV SOLN: 10.0000 mg | Freq: Once | INTRAVENOUS | Status: DC

## 2020-03-17 MED ORDER — INSULIN ASPART 100 UNIT/ML ~~LOC~~ SOLN
0.0000 [IU] | Freq: Three times a day (TID) | SUBCUTANEOUS | Status: DC
  Administered 2020-03-17: 2 [IU] via SUBCUTANEOUS
  Administered 2020-03-18 – 2020-03-19 (×4): 1 [IU] via SUBCUTANEOUS
  Administered 2020-03-19: 2 [IU] via SUBCUTANEOUS
  Administered 2020-03-20: 1 [IU] via SUBCUTANEOUS
  Administered 2020-03-21: 2 [IU] via SUBCUTANEOUS
  Administered 2020-03-21: 1 [IU] via SUBCUTANEOUS

## 2020-03-17 MED ORDER — INSULIN NPH (HUMAN) (ISOPHANE) 100 UNIT/ML ~~LOC~~ SUSP
50.0000 [IU] | SUBCUTANEOUS | Status: AC
  Administered 2020-03-17: 50 [IU] via SUBCUTANEOUS
  Filled 2020-03-17: qty 10

## 2020-03-17 MED ORDER — FAMOTIDINE 20 MG PO TABS
20.0000 mg | ORAL_TABLET | Freq: Two times a day (BID) | ORAL | Status: DC
  Administered 2020-03-17: 20 mg via ORAL
  Filled 2020-03-17: qty 1

## 2020-03-17 NOTE — Progress Notes (Signed)
Inpatient Diabetes Program Recommendations  AACE/ADA: New Consensus Statement on Inpatient Glycemic Control (2015)  Target Ranges:  Prepandial:   less than 140 mg/dL      Peak postprandial:   less than 180 mg/dL (1-2 hours)      Critically ill patients:  140 - 180 mg/dL   Lab Results  Component Value Date   GLUCAP 300 (H) 03/17/2020   HGBA1C 8.4 (A) 11/12/2019    Review of Glycemic Control  Diabetes history: DM Outpatient Diabetes medications: Novolin N 200 units q am + Novolog 40 units tid meal coverage  Current orders for Inpatient glycemic control: IV insulin  Inpatient Diabetes Program Recommendations:   When patient ready to transition off of IV insulin: -Levemir 16 units bid (0.3 units/kg x 108.2 kg)-give 2 hrs prior to IV insulin drip discontinued -Novolog 0-9 units q 4 hrs. While NPO, then tid + hs 0-5 units -Add Novolog 5 units tid when eating meals (50% meals)  Patient sees Dr. Renato Shin for endocrinology and last office visit was 11/12/19. Note from office visit: "Hypoglycemia, due to insulin: this limits aggressiveness of glycemic control"  Thank you, Nani Gasser. Hanks, RN, MSN, CDE  Diabetes Coordinator Inpatient Glycemic Control Team Team Pager [phone] (8am-5pm) 03/17/2020 9:29 AM

## 2020-03-17 NOTE — Progress Notes (Signed)
[facility] for Heparin Indication: atrial fibrillation  Allergies  Allergen Reactions  . Atorvastatin Hives  . Rosuvastatin Rash    Patient Measurements: Weight: 106 kg (233 lb 11 oz) Heparin Dosing Weight: 97 kg  Vital Signs: Temp: 98.6 F (37 C) (03/16 0337) Temp Source: Oral (03/16 0337) BP: 132/70 (03/16 0500) Pulse Rate: 84 (03/16 0500)  Labs: Recent Labs    03/16/20 1334 03/16/20 1423 03/16/20 1630 03/16/20 1748 03/16/20 1925 03/16/20 2000 03/16/20 2045 03/17/20 0017 03/17/20 0453  HGB 11.7* 11.6* 12.2*  --   --   --   --   --  10.8*  HCT 33.7* 34.0* 36.0*  --   --   --   --   --  29.0*  PLT 171  --   --   --   --   --   --   --  136*  HEPARINUNFRC  --   --   --  <0.10*  --   --   --   --  0.28*  CREATININE 12.16*  --   --   --   --    < > 12.21* 12.09* 12.29*  TROPONINIHS  --   --   --   --  584*  --   --   --   --    < > = values in this interval not displayed.    Estimated Creatinine Clearance: 8.1 mL/min (A) (by C-G formula based on SCr of 12.29 mg/dL (H)).   Medical History: Past Medical History:  Diagnosis Date  . Chronic kidney disease   . Diabetes ([place])   . Dyslipidemia   . ESRD (end stage renal disease) on dialysis [facility])     Assessment: Pt is a 77 YOM presenting with AMS s/p missing dialysis yesterday. Pt has a PMH significant for ESRD on dialysis and atrial fibrillation. As patient is altered, it is unclear if he was still on Eliquis PTA, the dosing regimen, or last dose.Pharmacy has been consulted to initiate heparin. Given uncertainty of anticoagulation before admission and dialysis dependence, will obtain baseline heparin level and dose heparin conservatively for now, omitting a bolus dose.   3/16 AM update:  Heparin level just below goal  Goal of Therapy:  Heparin level 0.3-0.7 units/ml Monitor platelets by anticoagulation protocol: Yes   Plan:  Inc heparin to 1550 units/hr 1400 heparin  level  Narda Bonds, PharmD, [place] Pharmacist Phone: [phone]

## 2020-03-17 NOTE — CV Procedure (Signed)
2D echo attempted for the second time, but heart rate is in the 140's to 150's. Will try echo later when rate comes down.

## 2020-03-17 NOTE — CV Procedure (Signed)
2D echo attempted, but patient just finishing dialysis. Staff has to hold pressure for 10 mins on left side. Will try echo later.

## 2020-03-17 NOTE — CV Procedure (Signed)
2D echo attempted, but heart rate still high. Will try tomorrow.

## 2020-03-17 NOTE — Progress Notes (Signed)
Icard Progress Note Patient Name: Darryl Kelley DOB: Mar 15, 1965 MRN: [id number]   Date of Service  03/17/2020  HPI/Events of Note  K+ 2.9, patient has ESRD-DD and plan per nephrology is to dialyze next session with a K+ bath.  eICU Interventions  KCL 20 meq po x 1 ordered.        Frederik Pear 03/17/2020, 9:41 PM

## 2020-03-17 NOTE — Progress Notes (Signed)
Afib with RVR remains uncontrolled despite max dose diltiazem, coreg,  3x metoprolol IV. Adding digoxin load tonight. Would prefer to avoid amiodarone given that he may chemically cardiovert and has not been on [facility] as an outpatient. Goal HR <110.  Julian Hy, DO 03/17/20 3:54 PM [facility]

## 2020-03-17 NOTE — Progress Notes (Signed)
NAMEKhaliel Kelley, MRN:  [id number], DOB:  1965-06-17, LOS: 1 ADMISSION DATE:  03/16/2020, CONSULTATION DATE:  03/16/20 REFERRING MD:  Zenia Resides - EM , CHIEF COMPLAINT:  AMS  Brief History:  55 year old male with ESRD, missed HD; here with AMS, found to have Glu > 1000.  Past Medical History:  T2DM, ESRD, Hep C, ?Endocarditis (can find no correlating evidence for this), Diastolic HF, Afib, Gout, HLD, Pain, HTN  Significant Hospital Events:  3/15 Presented to ED with AMS + Glu > 1000. Started  On abx, 1L LR, insulin gtt. PCCM asked to evaluate for admission   Consults:  Nephrology PCCM  Procedures:    Significant Diagnostic Tests:   03/16/2020 CXR >> RLL opacity   Micro Data:  COVID neg 3/15  Antimicrobials:  Ceftriaxone 3/15 >> Azithromycin 3/15 >>  Interim History / Subjective:  Feeling better today Generally aware of why he is here, very conversant and in good spirits Endorses mild nausea (states this is common with his gastroparesis) Denies CP/SOB, vomiting, dizziness No significant concerns today  Objective   Blood pressure (!) 155/100, pulse 100, temperature 98.4 F (36.9 C), temperature source Oral, resp. rate (!) 21, weight 108.2 kg, SpO2 96 %.        Intake/Output Summary (Last 24 hours) at 03/17/2020 0816 Last data filed at 03/17/2020 0600 Gross per 24 hour  Intake 3031.44 ml  Output --  Net 3031.44 ml   Filed Weights   03/16/20 1700 03/16/20 1826 03/17/20 0646  Weight: 115.7 kg 106 kg 108.2 kg   Physical Examination: General: Acutely ill-appearing male in NAD. HEENT: [place]/AT, anicteric sclera, PERRL, moist mucous membranes. Neuro: Awake, oriented x 3. Following commands consistently. Moves all 4 extremities spontaneously. Strength 4/5 in all 4 extremities. CV: RRR, no m/g/r. PULM: Breathing even and unlabored on 3LNC. Lung fields CTAB anteriorly. GI: Soft, nontender, mildly distended. Slightly hypoactive bowel sounds. 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 Skin: Warm/dry, no rashes.  Resolved Hospital Problem list     Assessment & Plan:  Acute metabolic encephalopathy likely secondary to HHS/DKA Constellation of lab findings consistent with some combination of HHS and DKA.  Confounded by ESRD. Betahydroxybutyrate elevated 6.74. Patient reports that prior to admission he had not utilized his insulin in approximately 2 weeks. - Endotool insulin gtt, HHS Protocol, transition to NPH BID per pharmacy recs - [place] - Transition to floor today in the setting of normalizing CBG and improved mentation  ESRD secondary to T2DM/HTN, dialysis-dependent Hypokalemia in the setting of HHS History of ESRD, dialysis-dependent (TThS). Missed HD session 3/14 and became altered/encephalopathic, found to be hyperglycemic with c/f HHS/DKA. - Nephrology following, appreciate assistance - Continue iHD (lower volume per Nephro, as patient under usual EDW) - Trend BMP - Replete electrolytes as indicated - Avoid nephrotoxic agents as able  Possible RLL CAP without signs of sepsis CXR 3/15 demonstrating ill-defined airspace opacity of RLL, c/f focal PNA. PCT  - Continue Azithromycin/Ceftriaxone - Consider transition to Augmentin - F/u expectorated sputum Cx  Hypertension Hypertensive to SBP 200s. Home meds initially held in the setting of encephalopathy/inability to take PO. Home meds include amlodipine, Coreg. - Resume home meds  CAD Hyperlipidemia History of CAD, nonspecific CP on admission. Trop elevated to 584. - Resume ASA, Coreg - F/u with [facility] re: Plavix fill history - F/u Echo post-HD  Atrial fibrillation History of Afib (previously on Eliquis). CHA2DS2-VASc score 3  with stroke risk 3.2%/year. - Continue heparin gtt at present - Discuss with pharmacy re: outpatient AC coverage  Type 2 diabetes mellitus Gastroparesis Home regimen includes Novolin  200U Q AM, Novolog 40U TID AC. 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) Hepatitis C Marijuana abuse - Actively continues to smoke marijuana - F/u Echo as above  Best practice (evaluated daily)  Diet: Renal/Carb Controlled Pain/Anxiety/Delirium protocol (if indicated): Precedex PRN VAP protocol (if indicated): N/A DVT prophylaxis: Heparin gtt GI prophylaxis: Pepcid Glucose control: Endotool Mobility: Bedres Disposition: To Floor 3/17, TRH  Goals of Care:  Last date of multidisciplinary goals of care discussion: TBD Family and staff present: TBD Summary of discussion: TBD Follow up goals of care discussion due: 3/22 Code Status: Full  Critical care time: N/A   Rhae Lerner [facility] 03/17/20 8:17 AM  Please see [email or web address] for pager details.

## 2020-03-17 NOTE — Progress Notes (Signed)
[facility] ROUNDING NOTE   Subjective:   Brief history: 55 year old gentleman end-stage renal disease Tuesday Thursday Saturday dialysis screen per kidney center.  History of diabetes gastroparesis hypertension diastolic dysfunction history of endocarditis and hepatitis C.  Last dialysis 03/13/2020.  Was admitted 03/16/2020 with DKA.  Blood pressure 217/130 pulse 97 temperature 98.6 O2 sats 98% 3 L nasal cannula  IV heparin IV lactate Ringer's IV insulin IV azithromycin  Sodium 135 potassium 3 chloride 92 CO2 26 BUN 76 creatinine 12.29 glucose 615 hemoglobin 10.8   Objective:  Vital signs in last 24 hours:  Temp:  [98.4 F (36.9 C)-100.4 F (38 C)] 98.6 F (37 C) (03/16 0646) Pulse Rate:  [82-102] 97 (03/16 0707) Resp:  [8-35] 17 (03/16 0707) BP: (121-217)/(70-139) 217/130 (03/16 0707) SpO2:  [90 %-100 %] 96 % (03/16 0646) Weight:  [106 kg-115.7 kg] 108.2 kg (03/16 0646)  Weight change:  Filed Weights   03/16/20 1700 03/16/20 1826 03/17/20 0646  Weight: 115.7 kg 106 kg 108.2 kg    Intake/Output: I/O last 3 completed shifts: In: 3031.4 [I.V.:1649.2; IV Piggyback:1382.2] Out: -    Intake/Output this shift:  No intake/output data recorded.  Ill-appearing gentleman CVS- RRR no murmurs audible at this time RS-tachypneic clear to auscultation ABD- BS present soft non-distended EXT- no edema left AV fistula   Basic Metabolic Panel: Recent Labs  Lab 03/16/20 1334 03/16/20 1423 03/16/20 1630 03/16/20 2000 03/16/20 2045 03/17/20 0017 03/17/20 0453  NA 127*   < > 126* 125* 127* 130* 135  K 4.4   < > 5.3* 4.0 3.8 3.5 3.0*  CL 81*  --   --  82* 85* 88* 92*  CO2 16*  --   --  19* '22 23 26  '$ GLUCOSE 1,186*  --   --  1,165* 1,098* 907* 615*  BUN 67*  --   --  72* 73* 75* 76*  CREATININE 12.16*  --   --  12.24* 12.21* 12.09* 12.29*  CALCIUM 8.6*  --   --  8.4* 8.3* 8.7* 8.7*   < > = values in this interval not displayed.    Liver Function  Tests: Recent Labs  Lab 03/16/20 1334  AST 29  ALT 27  ALKPHOS 85  BILITOT 2.7*  PROT 7.0  ALBUMIN 3.3*   No results for input(s): LIPASE, AMYLASE in the last 168 hours. Recent Labs  Lab 03/16/20 1410  AMMONIA 30    CBC: Recent Labs  Lab 03/16/20 1334 03/16/20 1423 03/16/20 1630 03/17/20 0453  WBC 12.6*  --   --  8.9  NEUTROABS 10.1*  --   --   --   HGB 11.7* 11.6* 12.2* 10.8*  HCT 33.7* 34.0* 36.0* 29.0*  MCV 86.2  --   --  80.8  PLT 171  --   --  136*    Cardiac Enzymes: No results for input(s): CKTOTAL, CKMB, CKMBINDEX, TROPONINI in the last 168 hours.  BNP: Invalid input(s): POCBNP  CBG: Recent Labs  Lab 03/17/20 0423 03/17/20 0456 03/17/20 0526 03/17/20 0556 03/17/20 0628  GLUCAP 571* 509* 487* 487* 384*    Microbiology: Results for orders placed or performed during the hospital encounter of 03/16/20  Resp Panel by RT-PCR (Flu A&B, Covid) Nasopharyngeal Swab     Status: None   Collection Time: 03/16/20  2:34 PM   Specimen: Nasopharyngeal Swab; Nasopharyngeal(NP) swabs in vial transport medium  Result Value Ref Range Status   SARS Coronavirus 2 by RT PCR NEGATIVE NEGATIVE  Final    Comment: (NOTE) SARS-CoV-2 target nucleic acids are NOT DETECTED.  The SARS-CoV-2 RNA is generally detectable in upper respiratory specimens during the acute phase of infection. The lowest concentration of SARS-CoV-2 viral copies this assay can detect is 138 copies/mL. A negative result does not preclude SARS-Cov-2 infection and should not be used as the sole basis for treatment or other patient management decisions. A negative result may occur with  improper specimen collection/handling, submission of specimen other than nasopharyngeal swab, presence of viral mutation(s) within the areas targeted by this assay, and inadequate number of viral copies(<138 copies/mL). A negative result must be combined with clinical observations, patient history, and  epidemiological information. The expected result is Negative.  Fact Sheet for Patients:  EntrepreneurPulse.com.au  Fact Sheet for Healthcare Providers:  IncredibleEmployment.be  This test is no t yet approved or cleared by the [place] FDA and  has been authorized for detection and/or diagnosis of SARS-CoV-2 by FDA under an Emergency Use Authorization (EUA). This EUA will remain  in effect (meaning this test can be used) for the duration of the COVID-19 declaration under Section 564(b)(1) of the Act, 21 U.S.C.section 360bbb-3(b)(1), unless the authorization is terminated  or revoked sooner.       Influenza A by PCR NEGATIVE NEGATIVE Final   Influenza B by PCR NEGATIVE NEGATIVE Final    Comment: (NOTE) The Xpert Xpress SARS-CoV-2/FLU/RSV plus assay is intended as an aid in the diagnosis of influenza from Nasopharyngeal swab specimens and should not be used as a sole basis for treatment. Nasal washings and aspirates are unacceptable for Xpert Xpress SARS-CoV-2/FLU/RSV testing.  Fact Sheet for Patients: EntrepreneurPulse.com.au  Fact Sheet for Healthcare Providers: IncredibleEmployment.be  This test is not yet approved or cleared by the [place] FDA and has been authorized for detection and/or diagnosis of SARS-CoV-2 by FDA under an Emergency Use Authorization (EUA). This EUA will remain in effect (meaning this test can be used) for the duration of the COVID-19 declaration under Section 564(b)(1) of the Act, 21 U.S.C. section 360bbb-3(b)(1), unless the authorization is terminated or revoked.  Performed at [facility], [place] [address] [place], [place] [address]   MRSA PCR Screening     Status: None   Collection Time: 03/16/20  6:23 PM   Specimen: Nasal Mucosa; Nasopharyngeal  Result Value Ref Range Status   MRSA by PCR NEGATIVE NEGATIVE Final    Comment:        The GeneXpert MRSA  Assay (FDA approved for NASAL specimens only), is one component of a comprehensive MRSA colonization surveillance program. It is not intended to diagnose MRSA infection nor to guide or monitor treatment for MRSA infections. Performed at [facility], [place] [address], [place], [place] [address]     Coagulation Studies: No results for input(s): LABPROT, INR in the last 72 hours.  Urinalysis: No results for input(s): COLORURINE, LABSPEC, PHURINE, GLUCOSEU, HGBUR, BILIRUBINUR, KETONESUR, PROTEINUR, UROBILINOGEN, NITRITE, LEUKOCYTESUR in the last 72 hours.  Invalid input(s): APPERANCEUR    Imaging: DG Chest Port 1 View  Result Date: 03/16/2020 CLINICAL DATA:  Shortness of breath EXAM: PORTABLE CHEST 1 VIEW COMPARISON:  None FINDINGS: There is subtle ill-defined opacity in the right lower lobe region. The lungs elsewhere are clear. Heart is mildly enlarged with pulmonary vascularity normal. No adenopathy. No bone lesions. IMPRESSION: Ill-defined airspace opacity right lower lung region concerning for focus of pneumonia. Lungs elsewhere clear. Heart is slightly enlarged. No demonstrable adenopathy. Electronically Signed  By: Lowella Grip III M.D.   On: 03/16/2020 14:14     Medications:   . sodium chloride    . sodium chloride    . azithromycin Stopped (03/16/20 1655)  . cefTRIAXone (ROCEPHIN)  IV Stopped (03/16/20 1549)  . dexmedetomidine (PRECEDEX) IV infusion    . dextrose 5% lactated ringers    . famotidine (PEPCID) IV Stopped (03/16/20 2047)  . heparin 1,550 Units/hr (03/17/20 0600)  . insulin 4.8 Units/hr (03/17/20 0629)  . lactated ringers 125 mL/hr at 03/17/20 0600   . calcitRIOL  1.5 mcg Oral Q T,Th,Sat-1800  . Chlorhexidine Gluconate Cloth  6 each Topical Q0600  . heparin  4,000 Units Dialysis Once in dialysis  . heparin sodium (porcine)      . mouth rinse  15 mL Mouth Rinse BID  . metoCLOPramide (REGLAN) injection  5 mg Intravenous Q8H   sodium chloride,  sodium chloride, dextrose, labetalol, lidocaine (PF), lidocaine-prilocaine, pentafluoroprop-tetrafluoroeth  Assessment/ Plan:  Dialysis Orders: [facility] T,Th,S 4 hrs 15 min 200NRe 500/500 2.0K/2.0 Ca UFP 4 AVG -Heparin 4000 units IV TIW -Calcitriol 1.5 mcg PO TIW  Assessment/Plan:    1. DKA-BS 1186 AG >30. Started on DKA protocol per ED MD.  2. RRL PNA-BC drawn, started on ABX. Per primary.  3. AMS in setting of probable PNA/hyperglycemia. Has not missed HD treatments other than today. Probably due to sepsis/DKA.   4.  ESRD - T,Th, S via AVG. He has no compelling HD at present. Will have HD in AM.   Will dialyze on added potassium bath 5.  Hypertension/volume  - Very hypertensive on admission. No evidence of volume excess by exam. On amlodipine 10 mg po q HS and Losartan 50 mg PO at home. Left under EDW last treatment. He appears to be losing body weight.  Will need to lower volume in HD tomorrow.  6.  Anemia  - HGB at goal. Last Iron panel at HD unit 03/11/20 replete. Follow HGB.  7.  Metabolic bone disease - NPO at present. Resume VDRA, binders when able to eat.  8.  Nutrition - NPO at present 9. H/O Hx endocarditis with IVDA 10. H/O Hep C.  11. H/O gastroparesis. Was referred by Dr. Joelyn Oms to GI 03/11/20. No notes in Epic that he has been to GI yet.      LOS: [place] '@TODAY''@7'$ :18 AM

## 2020-03-18 ENCOUNTER — Inpatient Hospital Stay (HOSPITAL_COMMUNITY): Payer: Medicare HMO

## 2020-03-18 DIAGNOSIS — I480 Paroxysmal atrial fibrillation: Secondary | ICD-10-CM

## 2020-03-18 DIAGNOSIS — J189 Pneumonia, unspecified organism: Secondary | ICD-10-CM

## 2020-03-18 DIAGNOSIS — E1129 Type 2 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: Secondary | ICD-10-CM

## 2020-03-18 DIAGNOSIS — I428 Other cardiomyopathies: Secondary | ICD-10-CM | POA: Diagnosis not present

## 2020-03-18 DIAGNOSIS — E1010 Type 1 diabetes mellitus with ketoacidosis without coma: Secondary | ICD-10-CM

## 2020-03-18 DIAGNOSIS — I2510 Atherosclerotic heart disease of native coronary artery without angina pectoris: Secondary | ICD-10-CM | POA: Diagnosis present

## 2020-03-18 DIAGNOSIS — I160 Hypertensive urgency: Secondary | ICD-10-CM | POA: Diagnosis present

## 2020-03-18 DIAGNOSIS — E1110 Type 2 diabetes mellitus with ketoacidosis without coma: Secondary | ICD-10-CM | POA: Diagnosis present

## 2020-03-18 DIAGNOSIS — G9341 Metabolic encephalopathy: Secondary | ICD-10-CM | POA: Diagnosis present

## 2020-03-18 LAB — HEPARIN LEVEL (UNFRACTIONATED)
Heparin Unfractionated: 0.45 IU/mL (ref 0.30–0.70)
Heparin Unfractionated: 0.5 IU/mL (ref 0.30–0.70)

## 2020-03-18 LAB — BASIC METABOLIC PANEL
Anion gap: 13 (ref 5–15)
BUN: 43 mg/dL — ABNORMAL HIGH (ref 6–20)
CO2: 27 mmol/L (ref 22–32)
Calcium: 8.6 mg/dL — ABNORMAL LOW (ref 8.9–10.3)
Chloride: 98 mmol/L (ref 98–111)
Creatinine, Ser: 9.03 mg/dL — ABNORMAL HIGH (ref 0.61–1.24)
GFR, Estimated: 6 mL/min — ABNORMAL LOW (ref >60)
Glucose, Bld: 199 mg/dL — ABNORMAL HIGH (ref 70–99)
Potassium: 3.6 mmol/L (ref 3.5–5.1)
Sodium: 138 mmol/L (ref 135–145)

## 2020-03-18 LAB — CBC
HCT: 33.3 % — ABNORMAL LOW (ref 39.0–52.0)
Hemoglobin: 12 g/dL — ABNORMAL LOW (ref 13.0–17.0)
MCH: 30.2 pg (ref 26.0–34.0)
MCHC: 36 g/dL (ref 30.0–36.0)
MCV: 83.7 fL (ref 80.0–100.0)
Platelets: 108 10*3/uL — ABNORMAL LOW (ref 150–400)
RBC: 3.98 MIL/uL — ABNORMAL LOW (ref 4.22–5.81)
RDW: 11.8 % (ref 11.5–15.5)
WBC: 8.9 10*3/uL (ref 4.0–10.5)
nRBC: 0 % (ref 0.0–0.2)

## 2020-03-18 LAB — GLUCOSE, CAPILLARY
Glucose-Capillary: 202 mg/dL — ABNORMAL HIGH (ref 70–99)
Glucose-Capillary: 188 mg/dL — ABNORMAL HIGH (ref 70–99)
Glucose-Capillary: 165 mg/dL — ABNORMAL HIGH (ref 70–99)
Glucose-Capillary: 172 mg/dL — ABNORMAL HIGH (ref 70–99)
Glucose-Capillary: 188 mg/dL — ABNORMAL HIGH (ref 70–99)
Glucose-Capillary: 168 mg/dL — ABNORMAL HIGH (ref 70–99)
Glucose-Capillary: 164 mg/dL — ABNORMAL HIGH (ref 70–99)

## 2020-03-18 LAB — ECHOCARDIOGRAM COMPLETE
AV Mean grad: 10 mmHg
AV Peak grad: 16.3 mmHg
Ao pk vel: 2.02 m/s
Area-P 1/2: 1.72 cm2
S' Lateral: 2.6 cm
Weight: 3717.84 oz

## 2020-03-18 LAB — HEMOGLOBIN A1C
Hgb A1c MFr Bld: >15.5 % — ABNORMAL HIGH (ref 4.8–5.6)
Mean Plasma Glucose: >398 mg/dL

## 2020-03-18 LAB — TROPONIN I (HIGH SENSITIVITY)
Troponin I (High Sensitivity): 330 ng/L (ref <18)
Troponin I (High Sensitivity): 328 ng/L (ref <18)

## 2020-03-18 MED ORDER — CHLORHEXIDINE GLUCONATE CLOTH 2 % EX PADS
6.0000 | MEDICATED_PAD | Freq: Every day | CUTANEOUS | Status: DC
  Administered 2020-03-18 – 2020-03-21 (×2): 6 via TOPICAL

## 2020-03-18 MED ORDER — ACETAMINOPHEN 325 MG PO TABS
650.0000 mg | ORAL_TABLET | ORAL | Status: DC | PRN
  Administered 2020-03-18 – 2020-03-21 (×3): 650 mg via ORAL
  Filled 2020-03-18 (×4): qty 2

## 2020-03-18 MED ORDER — LOSARTAN POTASSIUM 50 MG PO TABS
50.0000 mg | ORAL_TABLET | Freq: Every day | ORAL | Status: DC
  Administered 2020-03-18 – 2020-03-21 (×4): 50 mg via ORAL
  Filled 2020-03-18 (×4): qty 1

## 2020-03-18 MED ORDER — AMLODIPINE BESYLATE 10 MG PO TABS
10.0000 mg | ORAL_TABLET | Freq: Every day | ORAL | Status: DC
  Administered 2020-03-18 – 2020-03-21 (×4): 10 mg via ORAL
  Filled 2020-03-18 (×4): qty 1

## 2020-03-18 MED ORDER — PREGABALIN 75 MG PO CAPS
75.0000 mg | ORAL_CAPSULE | Freq: Every day | ORAL | Status: DC
  Administered 2020-03-19 – 2020-03-21 (×3): 75 mg via ORAL
  Filled 2020-03-18 (×3): qty 1

## 2020-03-18 MED ORDER — CARVEDILOL 12.5 MG PO TABS
12.5000 mg | ORAL_TABLET | Freq: Once | ORAL | Status: AC
  Administered 2020-03-18: 12.5 mg via ORAL
  Filled 2020-03-18: qty 1

## 2020-03-18 MED ORDER — ASPIRIN EC 81 MG PO TBEC
81.0000 mg | DELAYED_RELEASE_TABLET | Freq: Every day | ORAL | Status: DC
  Administered 2020-03-19: 81 mg via ORAL
  Filled 2020-03-18 (×2): qty 1

## 2020-03-18 MED ORDER — HYDRALAZINE HCL 50 MG PO TABS: 50.0000 mg | ORAL_TABLET | Freq: Three times a day (TID) | ORAL | Status: DC

## 2020-03-18 MED ORDER — AMLODIPINE BESYLATE 5 MG PO TABS: 5.0000 mg | ORAL_TABLET | Freq: Every day | ORAL | Status: DC

## 2020-03-18 MED ORDER — CARVEDILOL 25 MG PO TABS
25.0000 mg | ORAL_TABLET | Freq: Two times a day (BID) | ORAL | Status: DC
  Administered 2020-03-18 – 2020-03-21 (×5): 25 mg via ORAL
  Filled 2020-03-18 (×5): qty 1

## 2020-03-18 MED ORDER — HYDRALAZINE HCL 50 MG PO TABS
50.0000 mg | ORAL_TABLET | Freq: Three times a day (TID) | ORAL | Status: DC
  Administered 2020-03-18 – 2020-03-21 (×8): 50 mg via ORAL
  Filled 2020-03-18 (×9): qty 1

## 2020-03-18 MED ORDER — PERFLUTREN LIPID MICROSPHERE
1.0000 mL | INTRAVENOUS | Status: AC | PRN
  Administered 2020-03-18: 2 mL via INTRAVENOUS
  Filled 2020-03-18: qty 10

## 2020-03-18 MED ORDER — EZETIMIBE 10 MG PO TABS
10.0000 mg | ORAL_TABLET | Freq: Every day | ORAL | Status: DC
  Administered 2020-03-18 – 2020-03-21 (×4): 10 mg via ORAL
  Filled 2020-03-18 (×4): qty 1

## 2020-03-18 MED ORDER — FLUTICASONE PROPIONATE 50 MCG/ACT NA SUSP: 2.0000 | Freq: Every day | NASAL | Status: DC

## 2020-03-18 MED ORDER — PREGABALIN 75 MG PO CAPS
150.0000 mg | ORAL_CAPSULE | Freq: Two times a day (BID) | ORAL | Status: DC
  Administered 2020-03-18: 150 mg via ORAL
  Filled 2020-03-18: qty 2

## 2020-03-18 MED ORDER — ROSUVASTATIN CALCIUM 5 MG PO TABS
10.0000 mg | ORAL_TABLET | Freq: Every day | ORAL | Status: DC
  Filled 2020-03-18 (×3): qty 2

## 2020-03-18 MED ORDER — CARVEDILOL 12.5 MG PO TABS: 12.5000 mg | ORAL_TABLET | Freq: Once | ORAL | Status: DC

## 2020-03-18 MED ORDER — ICOSAPENT ETHYL 1 G PO CAPS
2.0000 g | ORAL_CAPSULE | Freq: Two times a day (BID) | ORAL | Status: DC
  Administered 2020-03-18 – 2020-03-21 (×7): 2 g via ORAL
  Filled 2020-03-18 (×8): qty 2

## 2020-03-18 NOTE — Progress Notes (Signed)
PROGRESS NOTE    Darryl Kelley  [id number] DOB: 1965/01/22 DOA: 03/16/2020 PCP: Gifford Shave, MD (Confirm with patient/family/NH records and if not entered, this HAS to be entered at [facility] point of entry. "No PCP" if truly none.)   Chief Complaint  Patient presents with  . Altered Mental Status  . Hyperglycemia    Brief Narrative:  Patient is a 55 year old gentleman history of end-stage renal disease on hemodialysis, who had missed hemodialysis session, presenting to the ED with altered mental status, noted to have blood glucose level > 1000, and noted to be in DKA.  Chest x-ray done concerning for right lower lobe opacity/pneumonia.  Patient placed on insulin drip, IV fluids, IV antibiotics, admitted to the ICU for DKA.  Patient noted to have elevated troponins.  Patient also noted to go into A. fib with RVR, placed on a Cardizem drip, received IV metoprolol x3, also placed on digoxin and subsequently converted back to sinus rhythm.  Patient also placed on anticoagulation with IV heparin.   Assessment & Plan:   Principal Problem:   DKA (diabetic ketoacidosis) ([place]) Active Problems:   Hyperosmolar hyperglycemic state (HHS) ([place])   Type 2 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 (HCC)   End stage renal disease (HCC)   Hypertension   Hypertensive heart and renal disease   Mixed hyperlipidemia   Paroxysmal atrial fibrillation (HCC)   Acute metabolic encephalopathy   Hypertensive urgency   PNA (pneumonia)   CAD (coronary artery disease)  #1 DKA/hyperosmolar hyperglycemic state Patient presented to the ED with acute metabolic encephalopathy, noted to have a glucose level > 1000, elevated beta hydroxybutyrate level of 6.74, sodium of 127, bicarb of 16, anion gap of 30.  VBG with a pH of 7.4, PCO2 of 24, O2 of 119, PCO2 of 17, bicarb of 16.2. -Patient noted to have been out of insulin for about 2 weeks secondary to financial issues/insurance. -Chest x-ray  concerning for infectious etiology of possible pneumonia. -Troponin noted to be elevated on presentation. -EKG with normal sinus rhythm with no ischemic changes noted. -Patient has has subsequently been transitioned off the insulin drip and on subcutaneous insulin NPH 50 units twice daily.  Patient also on meal coverage NovoLog 2 units 3 times daily with meals as well as sliding scale insulin. -Hemoglobin A1c > 15.5 -CBG 188 this morning. -Due to financial constraints TOC and diabetes coordinator following. -May need to transition to Novolin 70/30 70 units twice daily hopefully in the next 24 hours which patient likely could be discharged home on per diabetes coordinator recommendations. -Continue IV Reglan and transition to oral Reglan tomorrow. -Supportive care.  2.  New onset A. fib with RVR/paroxysmal A. Fib CHA2DS2VASC score 3 -Patient noted to be in A. fib with RVR yesterday on maximum doses IV Cardizem drip, received IV metoprolol x3, placed on digoxin as well and has subsequently converted to normal sinus rhythm. -Patient did state in 2017 was admitted to the hospital in [place] with a similar presentation, at that time was placed on anticoagulation however since discharge from that hospital was not placed on anticoagulation. -2D echo ordered and pending. -Cycle cardiac enzymes. -Continue current regimen of Coreg for rate control.  On IV heparin for anticoagulation. -Cardiology consultation pending.  3.  Hypertensive urgency Patient noted to have systolic blood pressures in the 200s. -Patient currently on increased dose of Coreg 25 mg twice daily, twice daily, Norvasc 10 mg daily, hydralazine 50 mg p.o. every 8 hours.  Cozaar started  today per nephrology.  Follow.  4.  Probable right lower lobe CAP, sepsis ruled out -Chest x-ray on admission concerning for ill-defined right lower lobe opacity consistent with pneumonia. -SLP -Continue empiric IV Rocephin and  azithromycin. -Would likely transition to oral Augmentin in the next 1 to 2 days.  5.  Coronary artery disease/hyperlipidemia/elevated troponin Patient with history of coronary artery disease nonspecific chest pain on admission.  Patient noted to have elevated troponin of 584. -Repeat troponins. -2D echo pending. -Continue aspirin, Coreg, Cozaar, hydralazine. -Consult with cardiology for further evaluation and management.  6.  End-stage renal disease on hemodialysis secondary to diabetes mellitus type 2/hypertension, Patient with history of end-stage renal disease on hemodialysis( [facility]) -Patient noted to have missed HD session 03/15/2020 and subsequently with AMS noted to be hypoglycemic with DKA/HHS. -Nephrology following. -HD per nephrology. -Electrolyte management per nephrology.  7.  Poorly controlled diabetes mellitus type 2/gastroparesis Patient currently on NPH 50 units twice daily.  Hemoglobin A1c > 15.5. -Patient noted to have financial issues with obtaining his insulin. -May need to transition to Novolin 70/30, 70 units twice daily DM coordinator recommendations.  Could likely transition to that tomorrow. -Continue IV Reglan and transition to oral Reglan in the next 24 hours. -IV antiemetics as needed.  8.  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 -2D echo pending. -Outpatient follow-up.  9.  Acute metabolic encephalopathy Likely secondary to problem #1 and pneumonia in the setting of missed HD session. -Clinical improvement and likely close to baseline.   DVT prophylaxis: Heparin drip Code Status: Full Family Communication: Updated patient.  No family at bedside. Disposition:   Status is: Inpatient    Dispo: The patient is from: Home              Anticipated d/c is to: Home              Patient currently was in A. fib with RVR, cardiology evaluation pending, recently  transitioned to subcu insulin which is being adjusted.  Not stable for discharge.   Difficult to place patient no       Consultants:   PCCM admission  Cardiology consultation pending  Nephrology  Procedures:   2D echo 03/18/2020 pending.  Chest x-ray 03/16/2020 >>>> right lower lobe opacity  Antimicrobials:   IV azithromycin 03/16/2020>>>>  IV Rocephin 03/16/2020>>>>   Subjective: Patient laying in bed.  Denies any chest pain.  No shortness of breath.  No nausea or emesis.  Stated he takes Reglan 3 times daily.  States 5 years ago was diagnosed with A. fib in the hospital in [place] after he had presented with similar presentation as this time was on anticoagulation at that time that was discontinued on discharge.  Has not required anticoagulation no noted to be in A. fib per patient since then.  No bleeding. Patient noted to have gone into A. fib yesterday requiring max dose IV Cardizem drip, IV metoprolol x3, digoxin loading and subsequently converted to sinus rhythm.  Objective: Vitals:   03/18/20 1430 03/18/20 1500 03/18/20 1530 03/18/20 1600  BP: (!) 141/72 134/71 138/60 (!) 156/53  Pulse: 71 71 69 69  Resp: '14 15 14 15  '$ Temp:      TempSrc:      SpO2: 97% 96% 95% 97%  Weight:        Intake/Output Summary (Last 24 hours) at 03/18/2020 1622 Last data filed at 03/18/2020  1600 Gross per 24 hour  Intake 1175 ml  Output 176 ml  Net 999 ml   Filed Weights   03/16/20 1826 03/17/20 0646 03/17/20 1051  Weight: 106 kg 108.2 kg 105.4 kg    Examination:  General exam: Appears calm and comfortable  Respiratory system: Clear to auscultation. Respiratory effort normal. Cardiovascular system: S1 & S2 heard, RRR. No JVD, murmurs, rubs, gallops or clicks. No pedal edema. Gastrointestinal system: Abdomen is nondistended, soft and nontender. No organomegaly or masses felt. Normal bowel sounds heard. Central nervous system: Alert and oriented. No focal neurological  deficits. Extremities: Left upper extremity with AV fistula with thrill.  Skin: No rashes, lesions or ulcers Psychiatry: Judgement and insight appear normal. Mood & affect appropriate.     Data Reviewed: I have personally reviewed following labs and imaging studies  CBC: Recent Labs  Lab 03/16/20 1334 03/16/20 1423 03/16/20 1630 03/17/20 0453 03/17/20 1601 03/18/20 0509  WBC 12.6*  --   --  8.9 11.1* 8.9  NEUTROABS 10.1*  --   --   --   --   --   HGB 11.7* 11.6* 12.2* 10.8* 12.8* 12.0*  HCT 33.7* 34.0* 36.0* 29.0* 35.8* 33.3*  MCV 86.2  --   --  80.8 81.9 83.7  PLT 171  --   --  136* 159 108*    Basic Metabolic Panel: Recent Labs  Lab 03/17/20 0017 03/17/20 0453 03/17/20 0836 03/17/20 1601 03/18/20 0509  NA 130* 135 138 137 138  K 3.5 3.0* 3.0* 2.9* 3.6  CL 88* 92* 98 97* 98  CO2 '23 26 28 26 27  '$ GLUCOSE 907* 615* 211* 223* 199*  BUN 75* 76* 39* 37* 43*  CREATININE 12.09* 12.29* 7.35* 7.94* 9.03*  CALCIUM 8.7* 8.7* 9.0 8.9 8.6*  PHOS  --   --   --  2.8  --     GFR: Estimated Creatinine Clearance: 11.1 mL/min (A) (by C-G formula based on SCr of 9.03 mg/dL (H)).  Liver Function Tests: Recent Labs  Lab 03/16/20 1334 03/17/20 1601  AST 29  --   ALT 27  --   ALKPHOS 85  --   BILITOT 2.7*  --   PROT 7.0  --   ALBUMIN 3.3* 3.1*    CBG: Recent Labs  Lab 03/17/20 2314 03/18/20 0313 03/18/20 0716 03/18/20 1107 03/18/20 1509  GLUCAP 213* 202* 188* 165* 172*     Recent Results (from the past 240 hour(s))  Culture, blood (Routine X 2) w Reflex to ID Panel     Status: None (Preliminary result)   Collection Time: 03/16/20  1:35 PM   Specimen: BLOOD  Result Value Ref Range Status   Specimen Description BLOOD RIGHT ANTECUBITAL  Final   Special Requests   Final    BOTTLES DRAWN AEROBIC AND ANAEROBIC Blood Culture results may not be optimal due to an inadequate volume of blood received in culture bottles   Culture   Final    NO GROWTH 2 DAYS Performed at  [facility] [address], [place], [place] [address]    Report Status PENDING  Incomplete  Culture, blood (Routine X 2) w Reflex to ID Panel     Status: None (Preliminary result)   Collection Time: 03/16/20  1:40 PM   Specimen: BLOOD  Result Value Ref Range Status   Specimen Description BLOOD RIGHT ANTECUBITAL  Final   Special Requests   Final    BOTTLES DRAWN AEROBIC AND ANAEROBIC Blood  Culture results may not be optimal due to an inadequate volume of blood received in culture bottles   Culture   Final    NO GROWTH 2 DAYS Performed at [facility], [place] [address], [place], [place] [address]    Report Status PENDING  Incomplete  Resp Panel by RT-PCR (Flu A&B, Covid) Nasopharyngeal Swab     Status: None   Collection Time: 03/16/20  2:34 PM   Specimen: Nasopharyngeal Swab; Nasopharyngeal(NP) swabs in vial transport medium  Result Value Ref Range Status   SARS Coronavirus 2 by RT PCR NEGATIVE NEGATIVE Final    Comment: (NOTE) SARS-CoV-2 target nucleic acids are NOT DETECTED.  The SARS-CoV-2 RNA is generally detectable in upper respiratory specimens during the acute phase of infection. The lowest concentration of SARS-CoV-2 viral copies this assay can detect is 138 copies/mL. A negative result does not preclude SARS-Cov-2 infection and should not be used as the sole basis for treatment or other patient management decisions. A negative result may occur with  improper specimen collection/handling, submission of specimen other than nasopharyngeal swab, presence of viral mutation(s) within the areas targeted by this assay, and inadequate number of viral copies(<138 copies/mL). A negative result must be combined with clinical observations, patient history, and epidemiological information. The expected result is Negative.  Fact Sheet for Patients:  EntrepreneurPulse.com.au  Fact Sheet for Healthcare Providers:   IncredibleEmployment.be  This test is no t yet approved or cleared by the [place] FDA and  has been authorized for detection and/or diagnosis of SARS-CoV-2 by FDA under an Emergency Use Authorization (EUA). This EUA will remain  in effect (meaning this test can be used) for the duration of the COVID-19 declaration under Section 564(b)(1) of the Act, 21 U.S.C.section 360bbb-3(b)(1), unless the authorization is terminated  or revoked sooner.       Influenza A by PCR NEGATIVE NEGATIVE Final   Influenza B by PCR NEGATIVE NEGATIVE Final    Comment: (NOTE) The Xpert Xpress SARS-CoV-2/FLU/RSV plus assay is intended as an aid in the diagnosis of influenza from Nasopharyngeal swab specimens and should not be used as a sole basis for treatment. Nasal washings and aspirates are unacceptable for Xpert Xpress SARS-CoV-2/FLU/RSV testing.  Fact Sheet for Patients: EntrepreneurPulse.com.au  Fact Sheet for Healthcare Providers: IncredibleEmployment.be  This test is not yet approved or cleared by the [place] FDA and has been authorized for detection and/or diagnosis of SARS-CoV-2 by FDA under an Emergency Use Authorization (EUA). This EUA will remain in effect (meaning this test can be used) for the duration of the COVID-19 declaration under Section 564(b)(1) of the Act, 21 U.S.C. section 360bbb-3(b)(1), unless the authorization is terminated or revoked.  Performed at [facility], [place] [address], [place], [place] [address]   MRSA PCR Screening     Status: None   Collection Time: 03/16/20  6:23 PM   Specimen: Nasal Mucosa; Nasopharyngeal  Result Value Ref Range Status   MRSA by PCR NEGATIVE NEGATIVE Final    Comment:        The GeneXpert MRSA Assay (FDA approved for NASAL specimens only), is one component of a comprehensive MRSA colonization surveillance program. It is not intended to diagnose MRSA infection nor  to guide or monitor treatment for MRSA infections. Performed at [facility], [place] [address] [place], [place] [address]          Radiology Studies: ECHOCARDIOGRAM COMPLETE  Result Date: 03/18/2020    ECHOCARDIOGRAM REPORT  Patient Name:   Darryl Kelley Date of Exam: 03/18/2020 Medical Rec #:  [id number]          Height:       69.0 in Accession #:    [id number]         Weight:       232.4 lb Date of Birth:  Mar 20, 1965           BSA:          2.202 m Patient Age:    89 years           BP:           163/68 mmHg Patient Gender: M                  HR:           73 bpm. Exam Location:  Inpatient Procedure: 2D Echo, Cardiac Doppler, Color Doppler and Intracardiac            Opacification Agent Indications:    I42.9 Cardiomyopathy (unspecified)  History:        Patient has no prior history of Echocardiogram examinations.                 Risk Factors:Hypertension and Diabetes. Kidney failure.  Sonographer:    Merrie Roof RDCS Referring Phys: [id number] [place]  1. There is an LVOT gradient of 53 mm Hg which increaed to 58 mmHg with Valsalva.     . Left ventricular ejection fraction, by estimation, is 70 to 75%. The left ventricle has hyperdynamic function. The left ventricle has no regional wall motion abnormalities. There is moderate left ventricular hypertrophy. Left ventricular diastolic parameters are consistent with Grade I diastolic dysfunction (impaired relaxation).  2. Right ventricular systolic function is normal. The right ventricular size is normal.  3. The mitral valve is normal in structure. No evidence of mitral valve regurgitation. No evidence of mitral stenosis.  4. The aortic valve is normal in structure. Aortic valve regurgitation is not visualized. No aortic stenosis is present. FINDINGS  Left Ventricle: There is an LVOT gradient of 53 mm Hg which increaed to 58 mmHg with Valsalva. Left ventricular ejection fraction, by estimation, is 70 to 75%. The left ventricle  has hyperdynamic function. The left ventricle has no regional wall motion abnormalities. Definity contrast agent was given IV to delineate the left ventricular endocardial borders. The left ventricular internal cavity size was small. There is moderate left ventricular hypertrophy. Left ventricular diastolic parameters are consistent with Grade I diastolic dysfunction (impaired relaxation). Right Ventricle: The right ventricular size is normal. No increase in right ventricular wall thickness. Right ventricular systolic function is normal. Left Atrium: Left atrial size was normal in size. Right Atrium: Right atrial size was normal in size. Pericardium: There is no evidence of pericardial effusion. Mitral Valve: The mitral valve is normal in structure. No evidence of mitral valve regurgitation. No evidence of mitral valve stenosis. Tricuspid Valve: The tricuspid valve is grossly normal. Tricuspid valve regurgitation is not demonstrated. Aortic Valve: The aortic valve is normal in structure. Aortic valve regurgitation is not visualized. No aortic stenosis is present. Aortic valve mean gradient measures 10.0 mmHg. Aortic valve peak gradient measures 16.3 mmHg. Pulmonic Valve: The pulmonic valve was normal in structure. Pulmonic valve regurgitation is not visualized. Aorta: The aortic root and ascending aorta are structurally normal, with no evidence of dilitation. IAS/Shunts: The atrial septum is grossly normal.  LEFT VENTRICLE PLAX 2D LVIDd:  3.80 cm  Diastology LVIDs:         2.60 cm  LV e' medial:    6.42 cm/s LV PW:         1.40 cm  LV E/e' medial:  11.1 LV IVS:        1.50 cm  LV e' lateral:   6.64 cm/s LVOT diam:     2.10 cm  LV E/e' lateral: 10.8 LVOT Area:     3.46 cm  LEFT ATRIUM             Index LA diam:        3.90 cm 1.77 cm/m LA Vol (A2C):   96.9 ml 44.01 ml/m LA Vol (A4C):   88.4 ml 40.15 ml/m LA Biplane Vol: 95.0 ml 43.14 ml/m  AORTIC VALVE AV Vmax:      202.00 cm/s AV Vmean:     148.000 cm/s  AV VTI:       0.370 m AV Peak Grad: 16.3 mmHg AV Mean Grad: 10.0 mmHg  AORTA Ao Root diam: 2.90 cm Ao Asc diam:  2.70 cm MITRAL VALVE MV Area (PHT): 1.72 cm     SHUNTS MV Decel Time: 440 msec     Systemic Diam: 2.10 cm MV E velocity: 71.40 cm/s MV A velocity: 131.00 cm/s MV E/A ratio:  0.55 Mertie Moores MD Electronically signed by Mertie Moores MD Signature Date/Time: 03/18/2020/11:04:29 AM    Final         Scheduled Meds: . amLODipine  10 mg Oral Daily  . aspirin EC  81 mg Oral Daily  . calcitRIOL  1.5 mcg Oral Q T,Th,Sat-1800  . carvedilol  25 mg Oral BID WC  . Chlorhexidine Gluconate Cloth  6 each Topical Q0600  . Chlorhexidine Gluconate Cloth  6 each Topical Q0600  . ezetimibe  10 mg Oral Daily  . famotidine  20 mg Oral Daily  . heparin  4,000 Units Dialysis Once in dialysis  . hydrALAZINE  50 mg Oral Q8H  . icosapent Ethyl  2 g Oral BID  . insulin aspart  0-6 Units Subcutaneous TID WC  . insulin aspart  2 Units Subcutaneous TID WC  . insulin NPH Human  50 Units Subcutaneous BID AC & HS  . losartan  50 mg Oral Daily  . mouth rinse  15 mL Mouth Rinse BID  . metoCLOPramide (REGLAN) injection  5 mg Intravenous Q8H  . [START ON 03/19/2020] pregabalin  75 mg Oral Daily  . rosuvastatin  10 mg Oral Daily   Continuous Infusions: . sodium chloride    . sodium chloride    . azithromycin 500 mg (03/18/20 1451)  . cefTRIAXone (ROCEPHIN)  IV 2 g (03/18/20 1414)  . heparin 1,550 Units/hr (03/18/20 1455)  . insulin Stopped (03/17/20 1519)     LOS: 2 days    Time spent: 45 minutes    Irine Seal, MD [facility]   To contact the attending provider between 7A-7P or the covering provider during after hours 7P-7A, please log into the web site [email or web address] and access using universal [place] password for that web site. If you do not have the password, please call the hospital operator.  03/18/2020, 4:22 PM

## 2020-03-18 NOTE — Progress Notes (Signed)
Inpatient Diabetes Program Recommendations  AACE/ADA: New Consensus Statement on Inpatient Glycemic Control (2015)  Target Ranges:  Prepandial:   less than 140 mg/dL      Peak postprandial:   less than 180 mg/dL (1-2 hours)      Critically ill patients:  140 - 180 mg/dL   Lab Results  Component Value Date   GLUCAP 165 (H) 03/18/2020   HGBA1C >15.5 (H) 03/16/2020    Review of Glycemic Control Results for JOSUE, NIGG (MRN [id number]) as of 03/18/2020 11:54  Ref. Range 03/17/2020 21:57 03/17/2020 23:14 03/18/2020 03:13 03/18/2020 07:16 03/18/2020 11:07  Glucose-Capillary Latest Ref Range: 70 - 99 mg/dL 183 (H) 213 (H) 202 (H) 188 (H) 165 (H)   Diabetes history: DM Outpatient Diabetes medications: Novolin N 200 units q am + Novolog 40 units tid meal coverage  Current orders for Inpatient glycemic control: Novolog N 50 units bid + Novolog 2 units tid meal coverage + Novolog 0-6 units tid correction  Inpatient Diabetes Program Recommendations:   Consider Novolin Relion from [facility] @ discharge since patient has difficult time affording insulin although with high doses of insulin will still be high cost.  Novolin Relion 70/30 70 units bid ac breakfast and dinner would = approximately 98 units basal + 42 units meal coverage.  Discussed with case manager Whitman Hero regarding discharge needs.  Thank you, Nani Gasser. Verlisa Vara, RN, MSN, CDE  Diabetes Coordinator Inpatient Glycemic Control Team Team Pager [phone] (8am-5pm) 03/18/2020 12:00 PM

## 2020-03-18 NOTE — Progress Notes (Signed)
[facility] ROUNDING NOTE   Subjective:   Brief history: 55 year old gentleman end-stage renal disease Tuesday Thursday Saturday dialysis screen per kidney center.  History of diabetes gastroparesis hypertension diastolic dysfunction history of endocarditis and hepatitis C.  Last dialysis 03/17/2020 with 3.3 L removed.  His next dialysis will be 03/19/2020.  Was admitted [id number] with DKA, complicated with A. fib with rapid ventricular rate  Blood pressure 202/99 pulse 73 temperature 99.2  IV heparin IV diltiazem    Sodium 138 potassium 3.6 chloride 98 CO2 27 BUN 43 creatinine 9 glucose 199 calcium 8.6 hemoglobin 12.8  Home medications Norvasc 5 mg daily, aspirin 81 mg daily carvedilol 6.25 mg twice daily Zetia 10 mg daily losartan 50 mg daily NovoLog insulin, Renvela 2 tablets with meals, Lyrica 150 mg daily, Crestor 10 mg daily   Objective:  Vital signs in last 24 hours:  Temp:  [98.2 F (36.8 C)-99.6 F (37.6 C)] 99.6 F (37.6 C) (03/17 0354) Pulse Rate:  [69-131] 74 (03/17 0615) Resp:  [7-32] 16 (03/17 0615) BP: (102-220)/(69-130) 177/99 (03/17 0615) SpO2:  [89 %-100 %] 95 % (03/17 0615) Weight:  [105.4 kg-108.2 kg] 105.4 kg (03/16 1051)  Weight change: -10.3 kg Filed Weights   03/16/20 1826 03/17/20 0646 03/17/20 1051  Weight: 106 kg 108.2 kg 105.4 kg    Intake/Output: I/O last 3 completed shifts: In: 4380.4 [P.O.:340; I.V.:2339.8; IV Piggyback:1700.6] Out: 3300 [Urine:300; Other:3000]   Intake/Output this shift:  Total I/O In: 246.2 [I.V.:214.8; IV Piggyback:31.5] Out: -   Ill-appearing gentleman CVS- RRR no murmurs audible at this time RS-tachypneic clear to auscultation ABD- BS present soft non-distended EXT- no edema left AV fistula   Basic Metabolic Panel: Recent Labs  Lab 03/17/20 0017 03/17/20 0453 03/17/20 0836 03/17/20 1601 03/18/20 0509  NA 130* 135 138 137 138  K 3.5 3.0* 3.0* 2.9* 3.6  CL 88* 92* 98 97* 98  CO2 '23 26 28 26  27  '$ GLUCOSE 907* 615* 211* 223* 199*  BUN 75* 76* 39* 37* 43*  CREATININE 12.09* 12.29* 7.35* 7.94* 9.03*  CALCIUM 8.7* 8.7* 9.0 8.9 8.6*  PHOS  --   --   --  2.8  --     Liver Function Tests: Recent Labs  Lab 03/16/20 1334 03/17/20 1601  AST 29  --   ALT 27  --   ALKPHOS 85  --   BILITOT 2.7*  --   PROT 7.0  --   ALBUMIN 3.3* 3.1*   No results for input(s): LIPASE, AMYLASE in the last 168 hours. Recent Labs  Lab 03/16/20 1410  AMMONIA 30    CBC: Recent Labs  Lab 03/16/20 1334 03/16/20 1423 03/16/20 1630 03/17/20 0453 03/17/20 1601  WBC 12.6*  --   --  8.9 11.1*  NEUTROABS 10.1*  --   --   --   --   HGB 11.7* 11.6* 12.2* 10.8* 12.8*  HCT 33.7* 34.0* 36.0* 29.0* 35.8*  MCV 86.2  --   --  80.8 81.9  PLT 171  --   --  136* 159    Cardiac Enzymes: No results for input(s): CKTOTAL, CKMB, CKMBINDEX, TROPONINI in the last 168 hours.  BNP: Invalid input(s): POCBNP  CBG: Recent Labs  Lab 03/17/20 1521 03/17/20 1919 03/17/20 2157 03/17/20 2314 03/18/20 0313  GLUCAP 218* 165* 183* 213* 202*    Microbiology: Results for orders placed or performed during the hospital encounter of 03/16/20  Culture, blood (Routine X 2) w Reflex to ID  Panel     Status: None (Preliminary result)   Collection Time: 03/16/20  1:35 PM   Specimen: BLOOD  Result Value Ref Range Status   Specimen Description BLOOD RIGHT ANTECUBITAL  Final   Special Requests   Final    BOTTLES DRAWN AEROBIC AND ANAEROBIC Blood Culture results may not be optimal due to an inadequate volume of blood received in culture bottles   Culture   Final    NO GROWTH < 24 HOURS Performed at [facility], [place] [address], [place], [place] [address]    Report Status PENDING  Incomplete  Culture, blood (Routine X 2) w Reflex to ID Panel     Status: None (Preliminary result)   Collection Time: 03/16/20  1:40 PM   Specimen: BLOOD  Result Value Ref Range Status   Specimen Description BLOOD RIGHT ANTECUBITAL   Final   Special Requests   Final    BOTTLES DRAWN AEROBIC AND ANAEROBIC Blood Culture results may not be optimal due to an inadequate volume of blood received in culture bottles   Culture   Final    NO GROWTH < 24 HOURS Performed at [facility], [place] [address] [place], [place] [address]    Report Status PENDING  Incomplete  Resp Panel by RT-PCR (Flu A&B, Covid) Nasopharyngeal Swab     Status: None   Collection Time: 03/16/20  2:34 PM   Specimen: Nasopharyngeal Swab; Nasopharyngeal(NP) swabs in vial transport medium  Result Value Ref Range Status   SARS Coronavirus 2 by RT PCR NEGATIVE NEGATIVE Final    Comment: (NOTE) SARS-CoV-2 target nucleic acids are NOT DETECTED.  The SARS-CoV-2 RNA is generally detectable in upper respiratory specimens during the acute phase of infection. The lowest concentration of SARS-CoV-2 viral copies this assay can detect is 138 copies/mL. A negative result does not preclude SARS-Cov-2 infection and should not be used as the sole basis for treatment or other patient management decisions. A negative result may occur with  improper specimen collection/handling, submission of specimen other than nasopharyngeal swab, presence of viral mutation(s) within the areas targeted by this assay, and inadequate number of viral copies(<138 copies/mL). A negative result must be combined with clinical observations, patient history, and epidemiological information. The expected result is Negative.  Fact Sheet for Patients:  EntrepreneurPulse.com.au  Fact Sheet for Healthcare Providers:  IncredibleEmployment.be  This test is no t yet approved or cleared by the [place] FDA and  has been authorized for detection and/or diagnosis of SARS-CoV-2 by FDA under an Emergency Use Authorization (EUA). This EUA will remain  in effect (meaning this test can be used) for the duration of the COVID-19 declaration under Section 564(b)(1)  of the Act, 21 U.S.C.section 360bbb-3(b)(1), unless the authorization is terminated  or revoked sooner.       Influenza A by PCR NEGATIVE NEGATIVE Final   Influenza B by PCR NEGATIVE NEGATIVE Final    Comment: (NOTE) The Xpert Xpress SARS-CoV-2/FLU/RSV plus assay is intended as an aid in the diagnosis of influenza from Nasopharyngeal swab specimens and should not be used as a sole basis for treatment. Nasal washings and aspirates are unacceptable for Xpert Xpress SARS-CoV-2/FLU/RSV testing.  Fact Sheet for Patients: EntrepreneurPulse.com.au  Fact Sheet for Healthcare Providers: IncredibleEmployment.be  This test is not yet approved or cleared by the [place] FDA and has been authorized for detection and/or diagnosis of SARS-CoV-2 by FDA under an Emergency Use Authorization (EUA). This EUA will remain in effect (  meaning this test can be used) for the duration of the COVID-19 declaration under Section 564(b)(1) of the Act, 21 U.S.C. section 360bbb-3(b)(1), unless the authorization is terminated or revoked.  Performed at [facility], [place] [address] [place], [place] [address]   MRSA PCR Screening     Status: None   Collection Time: 03/16/20  6:23 PM   Specimen: Nasal Mucosa; Nasopharyngeal  Result Value Ref Range Status   MRSA by PCR NEGATIVE NEGATIVE Final    Comment:        The GeneXpert MRSA Assay (FDA approved for NASAL specimens only), is one component of a comprehensive MRSA colonization surveillance program. It is not intended to diagnose MRSA infection nor to guide or monitor treatment for MRSA infections. Performed at [facility], [place] [address], [place], [place] [address]     Coagulation Studies: No results for input(s): LABPROT, INR in the last 72 hours.  Urinalysis: No results for input(s): COLORURINE, LABSPEC, PHURINE, GLUCOSEU, HGBUR, BILIRUBINUR, KETONESUR, PROTEINUR, UROBILINOGEN, NITRITE,  LEUKOCYTESUR in the last 72 hours.  Invalid input(s): APPERANCEUR    Imaging: DG Chest Port 1 View  Result Date: 03/16/2020 CLINICAL DATA:  Shortness of breath EXAM: PORTABLE CHEST 1 VIEW COMPARISON:  None FINDINGS: There is subtle ill-defined opacity in the right lower lobe region. The lungs elsewhere are clear. Heart is mildly enlarged with pulmonary vascularity normal. No adenopathy. No bone lesions. IMPRESSION: Ill-defined airspace opacity right lower lung region concerning for focus of pneumonia. Lungs elsewhere clear. Heart is slightly enlarged. No demonstrable adenopathy. Electronically Signed   By: Lowella Grip III M.D.   On: 03/16/2020 14:14     Medications:   . sodium chloride    . sodium chloride    . azithromycin 500 mg (03/17/20 1644)  . cefTRIAXone (ROCEPHIN)  IV Stopped (03/17/20 1609)  . diltiazem (CARDIZEM) infusion Stopped (03/18/20 0408)  . heparin 1,550 Units/hr (03/18/20 0242)  . insulin Stopped (03/17/20 1519)   . calcitRIOL  1.5 mcg Oral Q T,Th,Sat-1800  . carvedilol  12.5 mg Oral BID WC  . Chlorhexidine Gluconate Cloth  6 each Topical Q0600  . famotidine  20 mg Oral Daily  . gabapentin  150 mg Oral Q1400  . heparin  4,000 Units Dialysis Once in dialysis  . insulin aspart  0-6 Units Subcutaneous TID WC  . insulin aspart  2 Units Subcutaneous TID WC  . insulin NPH Human  50 Units Subcutaneous BID AC & HS  . mouth rinse  15 mL Mouth Rinse BID  . metoCLOPramide (REGLAN) injection  5 mg Intravenous Q8H   sodium chloride, sodium chloride, dextrose, labetalol, lidocaine (PF), lidocaine-prilocaine, metoprolol tartrate, ondansetron (ZOFRAN) IV, pentafluoroprop-tetrafluoroeth  Assessment/ Plan:  Dialysis Orders: [facility] T,Th,S 4 hrs 15 min 200NRe 500/500 2.0K/2.0 Ca UFP 4 AVG -Heparin 4000 units IV TIW -Calcitriol 1.5 mcg PO TIW  Assessment/Plan:    1. DKA-BS 1186 AG >30. Started on DKA protocol per ED MD.  2. RRL PNA-BC drawn, started on ABX. Per  primary.  3. AMS in setting of probable PNA/hyperglycemia. Has not missed HD treatments other than today. Probably due to sepsis/DKA.   4.  ESRD - T,Th, S via AVG.    Last dialysis 03/17/2020 with 3.3 L removed next dialysis to be 03/19/2020 5.  Hypertension/volume  - Very hypertensive on admission. No evidence of volume excess by exam. On amlodipine 10 mg po q HS and Losartan 50 mg PO at home.  Will restart both these  medications.  Left under EDW last treatment. He appears to be losing body weight.  Will need to lower volume in HD .  6.  Anemia  - HGB at goal. Last Iron panel at HD unit 03/11/20 replete. Follow HGB.  7.  Metabolic bone disease - NPO at present. Resume VDRA, binders when able to eat.  8.  Nutrition - NPO at present 9. H/O Hx endocarditis with IVDA 10. H/O Hep C.  11. H/O gastroparesis. Was referred by Dr. Joelyn Oms to GI 03/11/20. No notes in Epic that he has been to GI yet.     LOS: [place] '@TODAY''@6'$ :31 AM

## 2020-03-18 NOTE — Progress Notes (Signed)
  Echocardiogram 2D Echocardiogram WITH CONTRAST has been performed.  Merrie Roof F 03/18/2020, 9:36 AM

## 2020-03-18 NOTE — Progress Notes (Signed)
[place] for Heparin Indication: atrial fibrillation  Allergies  Allergen Reactions  . Atorvastatin Hives  . Rosuvastatin Rash    Patient Measurements: Weight: 105.4 kg (232 lb 5.8 oz) Heparin Dosing Weight: 97 kg  Vital Signs: Temp: 99.6 F (37.6 C) (03/17 0354) Temp Source: Oral (03/17 0354) BP: 177/99 (03/17 0615) Pulse Rate: 74 (03/17 0615)  Labs: Recent Labs    03/16/20 1334 03/16/20 1423 03/16/20 1630 03/16/20 1748 03/16/20 1925 03/16/20 2000 03/17/20 0453 03/17/20 0836 03/17/20 1601 03/18/20 0509  HGB 11.7*   < > 12.2*  --   --   --  10.8*  --  12.8*  --   HCT 33.7*   < > 36.0*  --   --   --  29.0*  --  35.8*  --   PLT 171  --   --   --   --   --  136*  --  159  --   HEPARINUNFRC  --   --   --  <0.10*  --   --  0.28*  --   --  0.45  CREATININE 12.16*  --   --   --   --    < > 12.29* 7.35* 7.94*  --   TROPONINIHS  --   --   --   --  584*  --   --   --   --   --    < > = values in this interval not displayed.    Estimated Creatinine Clearance: 12.6 mL/min (A) (by C-G formula based on SCr of 7.94 mg/dL (H)).    Assessment: Pt is a 48 YOM presenting with AMS s/p missing dialysis yesterday. Pt has a PMH significant for ESRD on dialysis and atrial fibrillation. As patient is altered, it is unclear if he was still on Eliquis PTA, the dosing regimen, or last dose.Pharmacy has been consulted for heparin.  Heparin level therapeutic (0.45) on gtt at 1550 units/hr. No bleeding noted.  Goal of Therapy:  Heparin level 0.3-0.7 units/ml Monitor platelets by anticoagulation protocol: Yes   Plan:  Continue heparin at 1550 units/hr Will f/u 6 hr confirmatory heparin level  Sherlon Handing, PharmD, BCPS Please see amion for complete clinical pharmacist phone list 03/18/2020 6:21 AM

## 2020-03-18 NOTE — Progress Notes (Signed)
[place] for Heparin Indication: atrial fibrillation  Allergies  Allergen Reactions  . Atorvastatin Hives  . Rosuvastatin Rash    Patient Measurements: Weight: 105.4 kg (232 lb 5.8 oz) Heparin Dosing Weight: 97 kg  Vital Signs: Temp: 98.7 F (37.1 C) (03/17 1100) Temp Source: Oral (03/17 1100) BP: 115/34 (03/17 1300) Pulse Rate: 72 (03/17 1300)  Labs: Recent Labs    03/16/20 1925 03/16/20 2000 03/17/20 0453 03/17/20 0836 03/17/20 1601 03/18/20 0509 03/18/20 0956 03/18/20 1238  HGB  --   --  10.8*  --  12.8* 12.0*  --   --   HCT  --   --  29.0*  --  35.8* 33.3*  --   --   PLT  --   --  136*  --  159 108*  --   --   HEPARINUNFRC  --   --  0.28*  --   --  0.45  --  0.50  CREATININE  --    < > 12.29* 7.35* 7.94* 9.03*  --   --   TROPONINIHS 584*  --   --   --   --   --  330*  --    < > = values in this interval not displayed.    Estimated Creatinine Clearance: 11.1 mL/min (A) (by C-G formula based on SCr of 9.03 mg/dL (H)).    Assessment: Pt is a 82 YOM presenting with AMS s/p missing dialysis yesterday. Pt has a PMH significant for ESRD on dialysis and atrial fibrillation. Patient intended to be on apixaban prior to admission but patient reports not taking. Unclear exact reasoning why patient noncompliant when discussing with patient. Pharmacy has been consulted for heparin.  Heparin level of 0.5 is therapeutic on heparin 1550 units/hr. Hgb 12.0. Plt 108. No reported bleeding.   Goal of Therapy:  Heparin level 0.3-0.7 units/ml Monitor platelets by anticoagulation protocol: Yes   Plan:  Continue heparin at 1550 units/hr Monitor heparin level, CBC, and s/s of bleeding daily - next heparin level tomorrow morning since therapeutic x2  Follow up transition to oral anticoagulant   Cristela Felt, PharmD Clinical Pharmacist  03/18/2020 1:35 PM

## 2020-03-18 NOTE — Progress Notes (Signed)
Vomiting

## 2020-03-18 NOTE — Progress Notes (Signed)
Drawing labs

## 2020-03-18 NOTE — Consult Note (Addendum)
Cardiology Consultation:   Patient ID: Darryl Kelley MRN: [id number]; DOB: 09-Feb-1965  Admit date: 03/16/2020 Date of Consult: 03/18/2020  PCP:  Gifford Shave, MD   [place]  Cardiologist:  No primary care provider on file. sees Cardiology at [place] Provider:  No care team member to display Electrophysiologist:  None        Patient Profile:   Darryl Kelley is a 55 y.o. male with a hx of ESRD on HD, DM-2, hepatitis, atrial fib, diastolic HF admitted [id number] with AMS who is being seen today for the evaluation of atrial fib and elevated troponin at the request of Dr. Grandville Silos.   History of Present Illness:   [name] with above hx and PET myocardial perfusion 11/05/19 no evidence of ischemia.  neg Stress test 08/12/18 EF 67%, no nuclear evidence of of ischemia.  This was done in work up for renal transplant.  May have had cath 2018 but could not access. EKG 09/2019 with SR non specific ant ST elevation,   Echo 11/05/19 with normal LV function, mild LVH, trivial MR trivial TR.  Also hx of endocarditis.   Hx a fib but non compliant with eliquis. Unsure if taking out pt meds. chronic gastroparesis.   Unsure if on eliquis I see different meds eliquis and plavix.   Pt presented to ER 03/16/20 with chest pain, his answers were vague.  He was agitated, + cough.  His glucose was > 600  Prior to arrival and in ER > 1000.  Na 127, Cr 12, WBC 12,  Admitted.     EKG:  The EKG was personally reviewed and demonstrates:  SR with mildly elevated ST ant but sound similar to prior EKGS though no EKG to view.  Telemetry:  Telemetry was personally reviewed and demonstrates:  SR   At some point into a fib 3/16 HR 147  and placed on dilt drip. Troponin today 330 and 328  And on admit 584  A1C is > 15    Na 138, K+ 3.6 BUN 9 hgb 12 plts 108  BP 142/117 p 69   Past Medical History:  Diagnosis Date  . Chronic kidney disease   . Diabetes ([place])   .  Dyslipidemia   . ESRD (end stage renal disease) on dialysis ([place])     No past surgical history on file.   Home Medications:  Prior to Admission medications   Medication Sig Start Date End Date Taking? Authorizing Provider  acetaminophen (TYLENOL) 650 MG CR tablet Take 650 mg by mouth every 8 (eight) hours as needed for pain.   Yes [provider]  amLODipine (NORVASC) 5 MG tablet Take 1 tablet (5 mg total) by mouth daily. Patient taking differently: Take 10 mg by mouth daily. 04/16/19  Yes Gifford Shave, MD  aspirin 81 MG EC tablet Take 81 mg by mouth daily.  02/04/19  Yes [provider]  B Complex-C-Folic Acid (RENAL) 1 MG CAPS Take 1 capsule by mouth daily. 09/18/19  Yes [provider]  carvedilol (COREG) 6.25 MG tablet Take 6.25 mg by mouth 2 (two) times daily with a meal. 02/04/19  Yes [provider]  ezetimibe (ZETIA) 10 MG tablet Take 10 mg by mouth daily.  02/04/19  Yes [provider]  icosapent Ethyl (VASCEPA) 1 g capsule Take 2 capsules (2 g total) by mouth 2 (two) times daily. 02/16/20  Yes Gifford Shave, MD  Insulin Glargine [facility] KWIKPEN) 100 UNIT/ML Inject 60  Units into the skin in the morning and at bedtime. 03/14/20  Yes [provider]  LOPERAMIDE HCL PO Take 2 mg by mouth daily as needed (loose stools). 03/25/19 03/23/20 Yes [provider]  losartan (COZAAR) 50 MG tablet Take 50 mg by mouth at bedtime. 03/11/20  Yes [provider]  metoCLOPramide (REGLAN) 5 MG tablet TAKE 1 TABLET BY MOUTH 4 TIMES A DAY ONCE IN THE MORNING, AT NOON, EVENING, & AT BEDTIME Patient taking differently: Take 5 mg by mouth 4 (four) times daily -  before meals and at bedtime. 01/20/20  Yes Cresenzo, Christy Sartorius, MD  NOVOLOG FLEXPEN 100 UNIT/ML FlexPen INJECT 40 UNITS SUBQ 3 TIMES A DAY BEFORE MEALS Patient taking differently: Inject 40 Units into the skin 3 (three) times daily before meals. 01/01/20  Yes Renato Shin, MD   pregabalin (LYRICA) 150 MG capsule Take 1 capsule (150 mg total) by mouth 2 (two) times daily. 08/20/19  Yes Gifford Shave, MD  sevelamer carbonate (RENVELA) 800 MG tablet TAKE 2 TABLETS BY MOUTH 3 TIMES A DAY AND ONE WITH SNACK Patient taking differently: Take 800-1,600 mg by mouth See admin instructions. Taking 2 tabs ('1600mg'$ ) three times daily with a meal and 1 tablet ('800mg'$ ) with a snack 11/14/19  Yes Gifford Shave, MD  VITAMIN D PO Take 0.75 mcg by mouth 3 (three) times a week.  03/20/19 03/18/20 Yes [provider]  Continuous Blood Gluc Sensor (FREESTYLE LIBRE 14 DAY SENSOR) MISC 1 each by Does not apply route every 14 (fourteen) days. For use with continuous glucose monitoring system. Change every 14 days; E11.9 11/12/19   Renato Shin, MD  fluticasone [facility]) 50 MCG/ACT nasal spray Place 2 sprays into both nostrils daily. Patient not taking: No sig reported 04/16/19   Gifford Shave, MD  glucose blood [facility] VERIO) test strip 1 each by Other route in the morning and at bedtime. And lancets 2/day 11/12/19   Renato Shin, MD  heparin 1000 unit/mL SOLN injection Heparin Sodium (Porcine) 1,000 Units/mL Systemic 02/04/19 02/03/20  [provider]  Insulin Pen Needle (PEN NEEDLES) 31G X 8 MM MISC 1 each by Does not apply route 4 (four) times daily. 04/09/19   Renato Shin, MD  NOVOLIN N FLEXPEN 100 UNIT/ML Kiwkpen INJECT 200 UNITS INTO THE SKIN EVERY MORNING Patient not taking: No sig reported 12/13/19   Renato Shin, MD  rosuvastatin (CRESTOR) 10 MG tablet Take 1 tablet (10 mg total) by mouth daily. Patient not taking: No sig reported 04/17/19   Gifford Shave, MD    Inpatient Medications: Scheduled Meds: . amLODipine  10 mg Oral Daily  . aspirin EC  81 mg Oral Daily  . calcitRIOL  1.5 mcg Oral Q T,Th,Sat-1800  . carvedilol  25 mg Oral BID WC  . Chlorhexidine Gluconate Cloth  6 each Topical Q0600  . Chlorhexidine Gluconate Cloth  6 each Topical Q0600  .  ezetimibe  10 mg Oral Daily  . famotidine  20 mg Oral Daily  . heparin  4,000 Units Dialysis Once in dialysis  . hydrALAZINE  50 mg Oral Q8H  . icosapent Ethyl  2 g Oral BID  . insulin aspart  0-6 Units Subcutaneous TID WC  . insulin aspart  2 Units Subcutaneous TID WC  . insulin NPH Human  50 Units Subcutaneous BID AC & HS  . losartan  50 mg Oral Daily  . mouth rinse  15 mL Mouth Rinse BID  . metoCLOPramide (REGLAN) injection  5 mg Intravenous  Q8H  . [START ON 03/19/2020] pregabalin  75 mg Oral Daily  . rosuvastatin  10 mg Oral Daily   Continuous Infusions: . sodium chloride    . sodium chloride    . azithromycin 500 mg (03/18/20 1451)  . cefTRIAXone (ROCEPHIN)  IV 2 g (03/18/20 1414)  . heparin 1,550 Units/hr (03/18/20 1455)  . insulin Stopped (03/17/20 1519)   PRN Meds: sodium chloride, sodium chloride, acetaminophen, dextrose, labetalol, lidocaine (PF), lidocaine-prilocaine, metoprolol tartrate, ondansetron (ZOFRAN) IV, pentafluoroprop-tetrafluoroeth  Allergies:    Allergies  Allergen Reactions  . Atorvastatin Hives  . Rosuvastatin Rash    Social History:   Social History   Socioeconomic History  . Marital status: Single    Spouse name: Not on file  . Number of children: Not on file  . Years of education: Not on file  . Highest education level: Not on file  Occupational History  . Not on file  Tobacco Use  . Smoking status: Former Research scientist (life sciences)  . Smokeless tobacco: Never Used  Substance and Sexual Activity  . Alcohol use: Never  . Drug use: Never  . Sexual activity: Not Currently  Other Topics Concern  . Not on file  Social History Narrative  . Not on file   Social Determinants of Health   Financial Resource Strain: Not on file  Food Insecurity: Not on file  Transportation Needs: Not on file  Physical Activity: Not on file  Stress: Not on file  Social Connections: Not on file  Intimate Partner Violence: Not on file    Family History:    Family History   Problem Relation Age of Onset  . Diabetes Neg Hx      ROS:  Please see the history of present illness.  General:no colds or fevers, no weight changes Skin:no rashes or ulcers HEENT:no blurred vision, no congestion CV:see HPI PUL:see HPI GI:no diarrhea constipation or melena, no indigestion GU:no hematuria, no dysuria MS:no joint pain, no claudication Neuro:no syncope, no lightheadedness Endo:+ diabetes, no thyroid disease Renal ESRD on HD.  All other ROS reviewed and negative.     Physical Exam/Data:   Vitals:   03/18/20 1700 03/18/20 1730 03/18/20 1800 03/18/20 1830  BP: (!) 151/96 (!) 162/76 (!) 156/83 (!) 142/117  Pulse: 69 69 70 72  Resp: '14 13 17 14  '$ Temp:      TempSrc:      SpO2: 96% 96% 96% 98%  Weight:        Intake/Output Summary (Last 24 hours) at 03/18/2020 1908 Last data filed at 03/18/2020 1800 Gross per 24 hour  Intake 1506.04 ml  Output 176 ml  Net 1330.04 ml   Last 3 Weights 03/17/2020 03/17/2020 03/16/2020  Weight (lbs) 232 lb 5.8 oz 238 lb 8.6 oz 233 lb 11 oz  Weight (kg) 105.4 kg 108.2 kg 106 kg     Body mass index is 34.31 kg/m.  Exam per Dr. Margaretann Loveless   Relevant CV Studies:  ECHO 03/18/20 IMPRESSIONS    1. There is an LVOT gradient of 53 mm Hg which increaed to 58 mmHg with  Valsalva.   . Left ventricular ejection fraction, by estimation, is 70 to 75%. The  left ventricle has hyperdynamic function. The left ventricle has no  regional wall motion abnormalities. There is moderate left ventricular  hypertrophy. Left ventricular diastolic  parameters are consistent with Grade I diastolic dysfunction (impaired  relaxation).  2. Right ventricular systolic function is normal. The right ventricular  size is normal.  3. The mitral valve is normal in structure. No evidence of mitral valve  regurgitation. No evidence of mitral stenosis.  4. The aortic valve is normal in structure. Aortic valve regurgitation is  not visualized. No aortic  stenosis is present.   FINDINGS  Left Ventricle: There is an LVOT gradient of 53 mm Hg which increaed to  58 mmHg with Valsalva.  Left ventricular ejection fraction, by estimation, is 70 to 75%. The left  ventricle has hyperdynamic function. The left ventricle has no regional  wall motion abnormalities. Definity contrast agent was given IV to  delineate the left ventricular  endocardial borders. The left ventricular internal cavity size was small.  There is moderate left ventricular hypertrophy. Left ventricular diastolic  parameters are consistent with Grade I diastolic dysfunction (impaired  relaxation).   Right Ventricle: The right ventricular size is normal. No increase in  right ventricular wall thickness. Right ventricular systolic function is  normal.   Left Atrium: Left atrial size was normal in size.   Right Atrium: Right atrial size was normal in size.   Pericardium: There is no evidence of pericardial effusion.   Mitral Valve: The mitral valve is normal in structure. No evidence of  mitral valve regurgitation. No evidence of mitral valve stenosis.   Tricuspid Valve: The tricuspid valve is grossly normal. Tricuspid valve  regurgitation is not demonstrated.   Aortic Valve: The aortic valve is normal in structure. Aortic valve  regurgitation is not visualized. No aortic stenosis is present. Aortic  valve mean gradient measures 10.0 mmHg. Aortic valve peak gradient  measures 16.3 mmHg.   Pulmonic Valve: The pulmonic valve was normal in structure. Pulmonic valve  regurgitation is not visualized.   Aorta: The aortic root and ascending aorta are structurally normal, with  no evidence of dilitation.   IAS/Shunts: The atrial septum is grossly normal.         PET myoview 11/05/19  No Prior Studies For Comparison.    Image Analysis:    Rest Stress Conclusion  Basal Anterior Normal Normal Normal  Basal Ant Septal Normal Normal Normal  Basal Inf Septal  Normal Normal Normal  Basal Inferior Normal Normal Normal  Basal Inf Lat Normal Normal Normal  Basal Ant Lat Normal Normal Normal  Mid Anterior Normal Normal Normal  Mid Ant Septal Normal Normal Normal  Mid Inf Septal Normal Normal Normal  Mid Inferior Normal Normal Normal  Mid Inf Lateral Normal Normal Normal  Mid Ant Lateral Normal Normal Normal  Apical Anterior Normal Normal Normal  Apical Septal Normal Normal Normal  Apical Inferior Normal Normal Normal  Apical Lateral Normal Normal Normal  Apex Normal Normal Normal  SPECT RESULTS  Technical Quality: Excellent   PERFUSION: SPECT images demonstrate homogeneous tracer distribution throughout the myocardium.  Resting images showed no change in the pattern of perfusion compared tostress.  No evidence of ischemia.    STRESS TEST Pharmacologic  Protocol: Regadenoson Dose: 0.4 mg  Stage Dose Time (min) BP (mmHg) HR (bpm) Comments  Resting  149/75 78 Right arm by side for scan  Stage 1 0.4 mg 1:00 94   Recovery 1 1:0098   Recovery 2 1:00 137/72 95 PVC  Recovery 3 2:00 92   Recovery 4 2:00 138/72 90   Recovery 5 3:00 89    Resting HR (bpm): 78 Resting BP (mmHg): 149 / 75 MaxPHR: 166 Target HR (bpm): 141  Peak HR (bpm): 98 Peak BP (mmHg): 137 / 72 %MaxPHR: 59 Double Product:  13426  BP Response: Normal blood pressure response during stress  Stress Termination: Planned / protocol completed  Stress Symptoms: None    ECG Negative  ECG Interpretation: Heart rate is N/A   IMAGE PROTOCOL Rest/Stress 1 Day Modality: PET- Discovery MI  Radiopharmaceutical Dose (mCi) Imaging Date Inj Time Img Time Rescan Reason Rescan Time  Rest: RB-82 Rubidium, IV 35.01 11/05/2019 12:40 12:40  Stress: RB-82 Rubidium, IV 35.25 11/05/2019 12:52 12:52  Rest Administration Site: Right Wrist Tech Administering Rest Dose: Arvid Right, CNMT  Stress Administration Site: Right Wrist Tech Administering  Stress Dose: Arvid Right, CNMT   FUNCTIONAL RESULTS (calculated via Gated SPECT)  Post Stress Image LV EF: 59 %  Stress EDV: 87 ml EDVI: 36 ml/m TID:  Stress ESV: 36 ml ESVI: 15 ml/m  Rest Image LV EF: 62 %  Rest Image LV EF:  Rest EDV: 88 ml EDVI: 37 ml/m  Rest ESV: 33 ml ESVI: 14 ml/m      Wall Motion  Wall Thickening  Anterior: Normal Normal  Apex: Normal Normal  Inferior: Normal Normal  Lateral: Normal Normal  Septal: Normal Normal   LV Ejection Fraction & Size: Normal left ventricular size and ejection fraction.    LV Function & Wall Thickening: Normal left ventricular wall motion and thickening.   FINAL COMMENTS    - Myocardial perfusion imaging is normal. No evidence of regadenoson-inducible ischemia.  - Left ventricular systolic function is normal.    Echo 11/05/19  INTERPRETATION ---------------------------------------------------------------   NORMAL LEFT VENTRICULAR SYSTOLIC FUNCTION WITH MILD LVH   NORMAL RIGHT VENTRICULAR SYSTOLIC FUNCTION   VALVULAR REGURGITATION: TRIVIAL MR, TRIVIAL TR   NO VALVULAR STENOSIS   NORMAL SALINE MICROCAVITATION STUDY AFTER VALSLVA.   INSUFFICIENT TR TO ESTIMATE RVSP   NO PRIOR STUDY FOR COMPARISON   3D acquisition and reconstructions were performed as part of this   examination to more accurately quantify the effects of identified   structural abnormalities as part of the exam. (post-processing on an   Independent workstation).     Laboratory Data:  High Sensitivity Troponin:   Recent Labs  Lab 03/16/20 1925 03/18/20 0956 03/18/20 1238  TROPONINIHS 584* 330* 328*     Chemistry Recent Labs  Lab 03/17/20 0836 03/17/20 1601 03/18/20 0509  NA 138 137 138  K 3.0* 2.9* 3.6  CL 98 97* 98  CO2 '28 26 27  '$ GLUCOSE 211* 223* 199*  BUN 39* 37* 43*  CREATININE 7.35* 7.94* 9.03*  CALCIUM 9.0 8.9 8.6*  GFRNONAA 8* 7* 6*  ANIONGAP '12 14 13    '$ Recent Labs  Lab 03/16/20 1334  03/17/20 1601  PROT 7.0  --   ALBUMIN 3.3* 3.1*  AST 29  --   ALT 27  --   ALKPHOS 85  --   BILITOT 2.7*  --    Hematology Recent Labs  Lab 03/17/20 0453 03/17/20 1601 03/18/20 0509  WBC 8.9 11.1* 8.9  RBC 3.59* 4.37 3.98*  HGB 10.8* 12.8* 12.0*  HCT 29.0* 35.8* 33.3*  MCV 80.8 81.9 83.7  MCH 30.1 29.3 30.2  MCHC 37.2* 35.8 36.0  RDW 11.3* 11.5 11.8  PLT 136* 159 108*   BNPNo results for input(s): BNP, PROBNP in the last 168 hours.  DDimer No results for input(s): DDIMER in the last 168 hours.   Radiology/Studies:  DG Chest Port 1 View  Result Date: 03/16/2020 CLINICAL DATA:  Shortness of breath EXAM: PORTABLE CHEST 1 VIEW COMPARISON:  None FINDINGS: There is subtle  ill-defined opacity in the right lower lobe region. The lungs elsewhere are clear. Heart is mildly enlarged with pulmonary vascularity normal. No adenopathy. No bone lesions. IMPRESSION: Ill-defined airspace opacity right lower lung region concerning for focus of pneumonia. Lungs elsewhere clear. Heart is slightly enlarged. No demonstrable adenopathy. Electronically Signed   By: Lowella Grip III M.D.   On: 03/16/2020 14:14   ECHOCARDIOGRAM COMPLETE  Result Date: 03/18/2020    ECHOCARDIOGRAM REPORT   Patient Name:   Darryl Kelley Date of Exam: 03/18/2020 Medical Rec #:  [id number]          Height:       69.0 in Accession #:    [id number]         Weight:       232.4 lb Date of Birth:  09-23-1965           BSA:          2.202 m Patient Age:    61 years           BP:           163/68 mmHg Patient Gender: M                  HR:           73 bpm. Exam Location:  Inpatient Procedure: 2D Echo, Cardiac Doppler, Color Doppler and Intracardiac            Opacification Agent Indications:    I42.9 Cardiomyopathy (unspecified)  History:        Patient has no prior history of Echocardiogram examinations.                 Risk Factors:Hypertension and Diabetes. Kidney failure.  Sonographer:    Merrie Roof RDCS Referring Phys:  [id number] [place]  1. There is an LVOT gradient of 53 mm Hg which increaed to 58 mmHg with Valsalva.     . Left ventricular ejection fraction, by estimation, is 70 to 75%. The left ventricle has hyperdynamic function. The left ventricle has no regional wall motion abnormalities. There is moderate left ventricular hypertrophy. Left ventricular diastolic parameters are consistent with Grade I diastolic dysfunction (impaired relaxation).  2. Right ventricular systolic function is normal. The right ventricular size is normal.  3. The mitral valve is normal in structure. No evidence of mitral valve regurgitation. No evidence of mitral stenosis.  4. The aortic valve is normal in structure. Aortic valve regurgitation is not visualized. No aortic stenosis is present. FINDINGS  Left Ventricle: There is an LVOT gradient of 53 mm Hg which increaed to 58 mmHg with Valsalva. Left ventricular ejection fraction, by estimation, is 70 to 75%. The left ventricle has hyperdynamic function. The left ventricle has no regional wall motion abnormalities. Definity contrast agent was given IV to delineate the left ventricular endocardial borders. The left ventricular internal cavity size was small. There is moderate left ventricular hypertrophy. Left ventricular diastolic parameters are consistent with Grade I diastolic dysfunction (impaired relaxation). Right Ventricle: The right ventricular size is normal. No increase in right ventricular wall thickness. Right ventricular systolic function is normal. Left Atrium: Left atrial size was normal in size. Right Atrium: Right atrial size was normal in size. Pericardium: There is no evidence of pericardial effusion. Mitral Valve: The mitral valve is normal in structure. No evidence of mitral valve regurgitation. No evidence of mitral valve stenosis. Tricuspid Valve: The tricuspid valve is grossly normal. Tricuspid valve regurgitation is not  demonstrated. Aortic Valve: The  aortic valve is normal in structure. Aortic valve regurgitation is not visualized. No aortic stenosis is present. Aortic valve mean gradient measures 10.0 mmHg. Aortic valve peak gradient measures 16.3 mmHg. Pulmonic Valve: The pulmonic valve was normal in structure. Pulmonic valve regurgitation is not visualized. Aorta: The aortic root and ascending aorta are structurally normal, with no evidence of dilitation. IAS/Shunts: The atrial septum is grossly normal.  LEFT VENTRICLE PLAX 2D LVIDd:         3.80 cm  Diastology LVIDs:         2.60 cm  LV e' medial:    6.42 cm/s LV PW:         1.40 cm  LV E/e' medial:  11.1 LV IVS:        1.50 cm  LV e' lateral:   6.64 cm/s LVOT diam:     2.10 cm  LV E/e' lateral: 10.8 LVOT Area:     3.46 cm  LEFT ATRIUM             Index LA diam:        3.90 cm 1.77 cm/m LA Vol (A2C):   96.9 ml 44.01 ml/m LA Vol (A4C):   88.4 ml 40.15 ml/m LA Biplane Vol: 95.0 ml 43.14 ml/m  AORTIC VALVE AV Vmax:      202.00 cm/s AV Vmean:     148.000 cm/s AV VTI:       0.370 m AV Peak Grad: 16.3 mmHg AV Mean Grad: 10.0 mmHg  AORTA Ao Root diam: 2.90 cm Ao Asc diam:  2.70 cm MITRAL VALVE MV Area (PHT): 1.72 cm     SHUNTS MV Decel Time: 440 msec     Systemic Diam: 2.10 cm MV E velocity: 71.40 cm/s MV A velocity: 131.00 cm/s MV E/A ratio:  0.55 Mertie Moores MD Electronically signed by Mertie Moores MD Signature Date/Time: 03/18/2020/11:04:29 AM    Final      Assessment and Plan:   1. A fib with RVR unknown if prior there is question if on eliquis and or plavix.  But pt went into a fib on the 16th with HR 130s placed on dilt drip and heparin.   2. Pt was in DKA on admit with glucose >1000 troponin pk of 500 now in 300s.  Pt has had neg stress test in past. Most likely demand ischemia Dr. Margaretann Loveless to see.    Risk Assessment/Risk Scores:          CHA2DS2-VASc Score = 2  This indicates a 2.2% annual risk of stroke. The patient's score is based upon: CHF History: No HTN History: Yes Diabetes  History: Yes Stroke History: No Vascular Disease History: No Age Score: 0 Gender Score: 0         For questions or updates, please contact [place] Please consult [email or web address] for contact info under    Signed, Cecilie Kicks, NP  03/18/2020 7:08 PM   Patient seen and examined with Cecilie Kicks NP.  Agree as above, with the following exceptions and changes as noted below. Darryl Kelley is a 55 yo male recently moved to the area who presents with DKA, and paroxysmal AF.  He has had a history of atrial fibrillation before and was previously on Eliquis.  He was placed on a diltiazem infusion which improved rates and has since converted to sinus rhythm as of yesterday.  In sinus on telemetry review today.  Echocardiogram shows hyperdynamic function with mid cavitary gradient  which is likely secondary to significant medical stressors.  Troponin elevated but flat, several recent negative ischemic evaluations in the setting of renal transplant evaluation.  Elevated troponin likely represents demand ischemia.. Gen: NAD, CV: RRR, soft systolic murmur, Lungs: clear, Abd: soft, Extrem: Warm, well perfused, no edema, Neuro/Psych: alert and oriented x 3, normal mood and affect. All available labs, radiology testing, previous records reviewed.  He is recovering from DKA and will have dialysis tomorrow.  A. fib-this is paroxysmal, agree with up titration of carvedilol which has been performed by the primary team.  He will need anticoagulation going forward given CHA2DS2-VASc score of at least 2 for hypertension and diabetes.  Currently on heparin drip, transition to oral anticoagulation when able.  Hyperdynamic LV with mid cavitary obstruction-secondary to increased medical demand from critical illness.  This will likely improve as he recovers.  Need to consider this when removing volume with dialysis, if he becomes hypotensive consider slight volume repletion.  Increasing beta-blockade will also help  with LV filling.  Elevated troponin-he has had at least 2 ischemic evaluations in the last 2 years with no ischemia.  Elevated troponin likely represents demand ischemia.  He has risk factors for coronary disease such as diabetes, hypertension, hyperlipidemia.  If he has transition to [place], he will not need ASA 81 mg daily.  Continue beta-blocker, continue Crestor.  Patient follows with [facility]ology at [facility].  I offered him follow-up in my clinic if he would like, this can be arranged upon hospital discharge.  Elouise Munroe, MD 03/18/20 9:25 PM

## 2020-03-19 DIAGNOSIS — Q203 Discordant ventriculoarterial connection: Secondary | ICD-10-CM

## 2020-03-19 LAB — BASIC METABOLIC PANEL
Anion gap: 11 (ref 5–15)
BUN: 50 mg/dL — ABNORMAL HIGH (ref 6–20)
CO2: 25 mmol/L (ref 22–32)
Calcium: 8 mg/dL — ABNORMAL LOW (ref 8.9–10.3)
Chloride: 98 mmol/L (ref 98–111)
Creatinine, Ser: 10.06 mg/dL — ABNORMAL HIGH (ref 0.61–1.24)
GFR, Estimated: 6 mL/min — ABNORMAL LOW (ref >60)
Glucose, Bld: 171 mg/dL — ABNORMAL HIGH (ref 70–99)
Potassium: 3.2 mmol/L — ABNORMAL LOW (ref 3.5–5.1)
Sodium: 134 mmol/L — ABNORMAL LOW (ref 135–145)

## 2020-03-19 LAB — CBC
HCT: 30.9 % — ABNORMAL LOW (ref 39.0–52.0)
Hemoglobin: 11.2 g/dL — ABNORMAL LOW (ref 13.0–17.0)
MCH: 30.1 pg (ref 26.0–34.0)
MCHC: 36.2 g/dL — ABNORMAL HIGH (ref 30.0–36.0)
MCV: 83.1 fL (ref 80.0–100.0)
Platelets: 83 10*3/uL — ABNORMAL LOW (ref 150–400)
RBC: 3.72 MIL/uL — ABNORMAL LOW (ref 4.22–5.81)
RDW: 11.5 % (ref 11.5–15.5)
WBC: 5 10*3/uL (ref 4.0–10.5)
nRBC: 0 % (ref 0.0–0.2)

## 2020-03-19 LAB — MAGNESIUM: Magnesium: 2.3 mg/dL (ref 1.7–2.4)

## 2020-03-19 LAB — GLUCOSE, CAPILLARY
Glucose-Capillary: 144 mg/dL — ABNORMAL HIGH (ref 70–99)
Glucose-Capillary: 113 mg/dL — ABNORMAL HIGH (ref 70–99)
Glucose-Capillary: 238 mg/dL — ABNORMAL HIGH (ref 70–99)
Glucose-Capillary: 92 mg/dL (ref 70–99)
Glucose-Capillary: 156 mg/dL — ABNORMAL HIGH (ref 70–99)
Glucose-Capillary: 188 mg/dL — ABNORMAL HIGH (ref 70–99)

## 2020-03-19 LAB — HEPARIN LEVEL (UNFRACTIONATED): Heparin Unfractionated: 0.44 IU/mL (ref 0.30–0.70)

## 2020-03-19 MED ORDER — LIDOCAINE-PRILOCAINE 2.5-2.5 % EX CREA: 1.0000 "application " | TOPICAL_CREAM | CUTANEOUS | Status: DC | PRN

## 2020-03-19 MED ORDER — PENTAFLUOROPROP-TETRAFLUOROETH EX AERO: 1.0000 "application " | INHALATION_SPRAY | CUTANEOUS | Status: DC | PRN

## 2020-03-19 MED ORDER — INSULIN ASPART PROT & ASPART (70-30 MIX) 100 UNIT/ML ~~LOC~~ SUSP
60.0000 [IU] | Freq: Two times a day (BID) | SUBCUTANEOUS | Status: DC
  Administered 2020-03-19 – 2020-03-21 (×5): 60 [IU] via SUBCUTANEOUS
  Filled 2020-03-19: qty 10

## 2020-03-19 MED ORDER — LIDOCAINE HCL (PF) 1 % IJ SOLN
5.0000 mL | INTRAMUSCULAR | Status: DC | PRN
Start: 2020-03-19 — End: 2020-03-19

## 2020-03-19 MED ORDER — METOCLOPRAMIDE HCL 5 MG PO TABS
5.0000 mg | ORAL_TABLET | Freq: Three times a day (TID) | ORAL | Status: DC
  Administered 2020-03-19 – 2020-03-20 (×5): 5 mg via ORAL
  Filled 2020-03-19 (×8): qty 1

## 2020-03-19 MED ORDER — HEPARIN SODIUM (PORCINE) 1000 UNIT/ML DIALYSIS
1000.0000 [IU] | INTRAMUSCULAR | Status: DC | PRN
  Filled 2020-03-19: qty 1

## 2020-03-19 MED ORDER — ALTEPLASE 2 MG IJ SOLR: 2.0000 mg | Freq: Once | INTRAMUSCULAR | Status: DC | PRN

## 2020-03-19 MED ORDER — ONDANSETRON 4 MG PO TBDP
8.0000 mg | ORAL_TABLET | Freq: Three times a day (TID) | ORAL | Status: DC | PRN
  Administered 2020-03-19 – 2020-03-20 (×2): 8 mg via ORAL
  Filled 2020-03-19 (×3): qty 2

## 2020-03-19 MED ORDER — SODIUM CHLORIDE 0.9 % IV SOLN: 100.0000 mL | INTRAVENOUS | Status: DC | PRN

## 2020-03-19 MED ORDER — HEPARIN SODIUM (PORCINE) 1000 UNIT/ML IJ SOLN
INTRAMUSCULAR | Status: AC
  Filled 2020-03-19: qty 4

## 2020-03-19 MED ORDER — APIXABAN 5 MG PO TABS
5.0000 mg | ORAL_TABLET | Freq: Two times a day (BID) | ORAL | Status: DC
  Administered 2020-03-19 – 2020-03-21 (×5): 5 mg via ORAL
  Filled 2020-03-19 (×5): qty 1

## 2020-03-19 MED ORDER — AZITHROMYCIN 250 MG PO TABS
500.0000 mg | ORAL_TABLET | Freq: Every day | ORAL | Status: DC
  Administered 2020-03-19 – 2020-03-20 (×2): 500 mg via ORAL
  Filled 2020-03-19: qty 1
  Filled 2020-03-19: qty 2

## 2020-03-19 MED ORDER — SODIUM CHLORIDE 0.9 % IV SOLN
8.0000 mg | Freq: Three times a day (TID) | INTRAVENOUS | Status: DC | PRN
  Filled 2020-03-19: qty 4

## 2020-03-19 NOTE — Evaluation (Signed)
Clinical/Bedside Swallow Evaluation Patient Details  Name: Darryl Kelley MRN: [id number] Date of Birth: 1965-01-23  Today's Date: 03/19/2020 Time: SLP Start Time (ACUTE ONLY): [id number] SLP Stop Time (ACUTE ONLY): 0910 SLP Time Calculation (min) (ACUTE ONLY): 18 min  Past Medical History:  Past Medical History:  Diagnosis Date  . Chronic kidney disease   . Diabetes ([place])   . Dyslipidemia   . ESRD (end stage renal disease) on dialysis [facility])    Past Surgical History: No past surgical history on file. HPI:  Pt is a 55 y/o male with PMH significant for DM, diabetes gastroparesis, hypertension diastolic dysfunction, history of endocarditis and hepatitis C, ESRD on dialysis and atrial fibrillation. Pt presented with AMS s/p missing dialysis on 3/14. CXR 3/15: ill-defined airspace opacity right lower lung region concerning for  focus of pneumonia.Pt diagnosed with acute metabolic encephalopathy.   Assessment / Plan / Recommendation Clinical Impression  Pt was seen for bedside swallow evaluation and he denied a history of dysphagia. Oral mechanism exam was [facility] and dentition was adequate. He tolerated all solids, liquids, and ~10 pills of varying sizes (taken simultaneously with water) without signs or symptoms of oropharyngeal dysphagia. A regular texture diet with thin liquids is recommended at this time. The possibility of conducting a modified barium swallow study to rule out silent aspiration was discussed with the referring MD, but was agreed that this would be deferred at this time due to higher likelihood of pneumonia being related to emesis than to oropharyngeal dysfunction. Further skilled SLP services are not clinically indicated for swallowing. SLP Visit Diagnosis: Dysphagia, unspecified (R13.10)    Aspiration Risk  No limitations    Diet Recommendation Regular;Thin liquid   Liquid Administration via: Cup;Straw Medication Administration: Whole meds with liquid Supervision: Patient  able to self feed Postural Changes: Seated upright at 90 degrees    Other  Recommendations Oral Care Recommendations: Oral care BID   Follow up Recommendations None      Frequency and Duration            Prognosis        Swallow Study   General Date of Onset: 03/18/20 HPI: Pt is a 55 y/o male with PMH significant for DM, diabetes gastroparesis, hypertension diastolic dysfunction, history of endocarditis and hepatitis C, ESRD on dialysis and atrial fibrillation. Pt presented with AMS s/p missing dialysis on 3/14. CXR 3/15: ill-defined airspace opacity right lower lung region concerning for  focus of pneumonia.Pt diagnosed with acute metabolic encephalopathy. Type of Study: Bedside Swallow Evaluation Previous Swallow Assessment: None Diet Prior to this Study: Regular;Thin liquids Temperature Spikes Noted: No Respiratory Status: Room air History of Recent Intubation: No Behavior/Cognition: Alert;Cooperative;Pleasant mood Oral Cavity Assessment: Within Functional Limits Oral Care Completed by SLP: No Oral Cavity - Dentition: Adequate natural dentition;Missing dentition Vision: Functional for self-feeding Self-Feeding Abilities: Able to feed self Patient Positioning: Upright in bed;Postural control adequate for testing Baseline Vocal Quality: Normal Volitional Cough: Strong Volitional Swallow: Able to elicit    Oral/Motor/Sensory Function Overall Oral Motor/Sensory Function: Within functional limits   Ice Chips Ice chips: Within functional limits Presentation: Spoon   Thin Liquid Thin Liquid: Within functional limits Presentation: Straw    Nectar Thick Nectar Thick Liquid: Not tested   Honey Thick Honey Thick Liquid: Not tested   Puree Puree: Within functional limits Presentation: Spoon   Solid     Solid: Within functional limits Presentation: [place]. Hardin Negus, [place], [place]  Office number [phone] Pager  [phone]  Horton Marshall 03/19/2020,9:46 AM

## 2020-03-19 NOTE — Progress Notes (Signed)
Education given to patient with picture of Novolin 70/30 (relion- [employer] brand), patient advised to ask for it at pharmacy counter, teach back verbalized.

## 2020-03-19 NOTE — Progress Notes (Signed)
[facility] Progress Note   Assessment/ Plan:   Dialysis Orders:[facility] T,Th,S 4 hrs 15 min 200NRe 500/500 2.0K/2.0 Ca UFP 4 AVG -Heparin 4000 units IV TIW -Calcitriol 1.5 mcg PO TIW  Assessment/Plan:  1. DKA-BS 1186 AG >30. Resolving.    2. RRL PNA-BC drawn, started on ABX. Per primary. 3. AMS: Resolved 4. ESRD - T,Th, S via AVG.    HD today 3/18 off schedule with resumption of regular schedule tomorrow 3/19. 5. Hypertension/volume - Very hypertensive on admission. No evidence of volume excess by exam. On amlodipine 10 mg po q HS and Losartan 50 mg PO at home.  Will restart both these medications.  Left under EDW last treatment.He appears to be losing body weight.Will need to lower volume in HD .  6. Anemia - HGB at goal. Last Iron panel at HD unit 03/11/20 replete. Follow HGB. 7. Metabolic bone disease - NPO at present. Resume VDRA, binders when able to eat. 8. Nutrition - NPO at present 9. H/OHx endocarditis with IVDA 10. H/O Hep C. 11. H/O gastroparesis. Was referred by Dr. Joelyn Oms to GI 03/11/20. No notes in Epic that he has been to GI yet.   Subjective:    Seen in room.  No issues today.  Being started on Eliquis for Afib.     Objective:   BP 122/67   Pulse 67   Temp 97.9 F (36.6 C) (Oral)   Resp 10   Wt 105.4 kg   SpO2 97%   BMI 34.31 kg/m   Physical Exam: Gen: NAD, sitting in bed CVS: irregular Resp: clear Abd: soft Ext: L AVG + T/B  Labs: BMET Recent Labs  Lab 03/16/20 2045 03/17/20 0017 03/17/20 0453 03/17/20 0836 03/17/20 1601 03/18/20 0509 03/19/20 0054  NA 127* 130* 135 138 137 138 134*  K 3.8 3.5 3.0* 3.0* 2.9* 3.6 3.2*  CL 85* 88* 92* 98 97* 98 98  CO2 '22 23 26 28 26 27 25  '$ GLUCOSE 1,098* 907* 615* 211* 223* 199* 171*  BUN 73* 75* 76* 39* 37* 43* 50*  CREATININE 12.21* 12.09* 12.29* 7.35* 7.94* 9.03* 10.06*  CALCIUM 8.3* 8.7* 8.7* 9.0 8.9 8.6* 8.0*  PHOS  --   --   --   --  2.8  --   --    CBC Recent  Labs  Lab 03/16/20 1334 03/16/20 1423 03/17/20 0453 03/17/20 1601 03/18/20 0509 03/19/20 0054  WBC 12.6*  --  8.9 11.1* 8.9 5.0  NEUTROABS 10.1*  --   --   --   --   --   HGB 11.7*   < > 10.8* 12.8* 12.0* 11.2*  HCT 33.7*   < > 29.0* 35.8* 33.3* 30.9*  MCV 86.2  --  80.8 81.9 83.7 83.1  PLT 171  --  136* 159 108* 83*   < > = values in this interval not displayed.      Medications:    . amLODipine  10 mg Oral Daily  . apixaban  5 mg Oral BID  . azithromycin  500 mg Oral Daily  . calcitRIOL  1.5 mcg Oral Q T,Th,Sat-1800  . carvedilol  25 mg Oral BID WC  . Chlorhexidine Gluconate Cloth  6 each Topical Q0600  . Chlorhexidine Gluconate Cloth  6 each Topical Q0600  . ezetimibe  10 mg Oral Daily  . famotidine  20 mg Oral Daily  . heparin  4,000 Units Dialysis Once in dialysis  . hydrALAZINE  50 mg Oral  Q8H  . icosapent Ethyl  2 g Oral BID  . insulin aspart  0-6 Units Subcutaneous TID WC  . insulin aspart protamine- aspart  60 Units Subcutaneous BID WC  . losartan  50 mg Oral Daily  . mouth rinse  15 mL Mouth Rinse BID  . metoCLOPramide  5 mg Oral TID AC & HS  . pregabalin  75 mg Oral Daily  . rosuvastatin  10 mg Oral Daily     Madelon Lips, MD 03/19/2020, 10:46 AM

## 2020-03-19 NOTE — TOC Benefit Eligibility Note (Signed)
Transition of Care [facility]) Benefit Eligibility Note    Patient Details  Name: Darryl Kelley MRN: [id number] Date of Birth: 05/07/1965   Medication/Dose: eliquis/ novolog 70/30  Covered?: Yes     Prescription Coverage [facility]  Spoke with Person/Company/Phone Number:: [facility]  Co-Pay: eliquis $56.23  // novolog 70/30 flexpen $64.28  Prior Approval: No          [place] Phone Number: 03/19/2020, 3:07 PM

## 2020-03-19 NOTE — Progress Notes (Signed)
[place] for Heparin Transition to Apixaban  Indication: atrial fibrillation  Allergies  Allergen Reactions  . Atorvastatin Hives  . Rosuvastatin Rash    Patient Measurements: Weight: 105.4 kg (232 lb 5.8 oz) Heparin Dosing Weight: 97 kg  Vital Signs: Temp: 97.9 F (36.6 C) (03/18 0323) Temp Source: Oral (03/18 0323) BP: 122/67 (03/18 0700) Pulse Rate: 67 (03/18 0700)  Labs: Recent Labs    03/16/20 1925 03/16/20 2000 03/17/20 1601 03/18/20 0509 03/18/20 0956 03/18/20 1238 03/19/20 0054  HGB  --    < > 12.8* 12.0*  --   --  11.2*  HCT  --    < > 35.8* 33.3*  --   --  30.9*  PLT  --    < > 159 108*  --   --  83*  HEPARINUNFRC  --    < >  --  0.45  --  0.50 0.44  CREATININE  --    < > 7.94* 9.03*  --   --  10.06*  TROPONINIHS 584*  --   --   --  330* 328*  --    < > = values in this interval not displayed.    Estimated Creatinine Clearance: 9.9 mL/min (A) (by C-G formula based on SCr of 10.06 mg/dL (H)).    Assessment: Pt is a 63 YOM presenting with AMS s/p missing dialysis yesterday. Pt has a PMH significant for ESRD on dialysis and atrial fibrillation. Patient intended to be on apixaban prior to admission but patient reports not taking. Unclear exact reasoning why patient noncompliant when discussing with patient. Pharmacy has been consulted for transition from heparin to apixaban for Afib. Age <80, wt >60kg, Scr >1.5 - dose not meet criteria for dose reduction. Hgb 11.1. Plt 83.   Goal of Therapy:  Monitor platelets by anticoagulation protocol: Yes   Plan:  Stop heparin and start apixaban '5mg'$  twice daily  Stop heparin infusion when first dose of apixaban administered - RN aware  Case management consult for apixaban cost  Pharmacy to provide education  Cristela Felt, PharmD Clinical Pharmacist  03/19/2020 7:54 AM

## 2020-03-19 NOTE — Evaluation (Signed)
Speech Language Pathology Evaluation Patient Details Name: Darryl Kelley MRN: [id number] DOB: 02/09/1965 Today's Date: 03/19/2020 Time: [id number] SLP Time Calculation (min) (ACUTE ONLY): 28 min  Problem List:  Patient Active Problem List   Diagnosis Date Noted  . Acute metabolic encephalopathy 0000000  . Hypertensive urgency 03/18/2020  . PNA (pneumonia) 03/18/2020  . CAD (coronary artery disease) 03/18/2020  . DKA (diabetic ketoacidosis) ([place]) 03/18/2020  . Community acquired pneumonia   . Hyperosmolar hyperglycemic state (HHS) ([place]) 03/16/2020  . Anesthesia of skin 08/25/2019  . Moderate protein-calorie malnutrition ([place]) 06/27/2019  . Pain due to onychomycosis of toenails of both feet 06/25/2019  . Gout, unspecified 06/24/2019  . Hypoglycemia, unspecified 06/04/2019  . Proliferative diabetic retinopathy of right eye ([place]) 05/05/2019  . Proliferative diabetic retinopathy of left eye ([place]) 05/05/2019  . Nuclear sclerotic cataract of both eyes 04/21/2019  . Encounter to establish care with new doctor 04/16/2019  . Night sweats 04/16/2019  . Dyslipidemia   . Diarrhea, unspecified 03/25/2019  . Pain, unspecified 02/15/2019  . Type 2 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 ([place]) [id number]  . End stage renal disease ([place]) 02/03/2019  . Allergy, unspecified, initial encounter 02/03/2019  . Coagulation defect, unspecified ([place]) 02/03/2019  . Secondary hyperparathyroidism of renal origin ([place]) 02/03/2019  . Chronic hepatitis C without hepatic coma ([place]) 09/11/2018  . Hepatitis B core antibody positive 09/11/2018  . Chronic kidney disease, stage 4 (severe) ([place]) 07/30/2018  . Endocarditis 07/30/2018  . Hypertension 07/30/2018  . LVH (left ventricular hypertrophy) 07/30/2018  . Nephrotic range proteinuria 07/30/2018  . Worsening renal function 07/30/2018  . Chronic diastolic heart failure ([place]) 07/26/2018  . Hypertensive heart and renal disease 07/26/2018   . Mixed hyperlipidemia 07/26/2018  . Paroxysmal atrial fibrillation ([place]) 07/26/2018   Past Medical History:  Past Medical History:  Diagnosis Date  . Chronic kidney disease   . Diabetes ([place])   . Dyslipidemia   . ESRD (end stage renal disease) on dialysis [facility])    Past Surgical History: No past surgical history on file. HPI:  Pt is a 55 y/o male with PMH significant for DM, diabetes gastroparesis, hypertension diastolic dysfunction, history of endocarditis and hepatitis C, ESRD on dialysis and atrial fibrillation. Pt presented with AMS s/p missing dialysis on 3/14. CXR 3/15: ill-defined airspace opacity right lower lung region concerning for  focus of pneumonia.Pt diagnosed with acute metabolic encephalopathy.   Assessment / Plan / Recommendation Clinical Impression  Pt participated in speech/language evaluation. 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 He stated that he has a ninth-grade education, but did complete some classes while in prison. Per the pt, he has not been "a school person" and has never been good at tasks which require math. Pt denied any acute changes in speech, language, or cognition. No impairments were noted in speech or language. The [facility] Mental Status Examination was completed to evaluate the pt's cognitive-linguistic skills. He achieved a score of 22/30 which is below the normal limits of 25 or more out of 30 and is suggestive of a mild impairment. He exhibited difficulty in the areas of memory and higher-level executive function. However, pt indicated that he believed his performance is at baseline and he was able to demonstrate adequate cognitive-linguistic skills during a more functional medication management task. Further acute skilled SLP services are not clinically indicated at this time. Pt, referring MD, and nursing were educated regarding results and recommendations; all  parties verbalized understanding  as well as agreement with plan of care.    SLP Assessment  SLP Recommendation/Assessment: Patient does not need any further Speech Lanaguage Pathology Services SLP Visit Diagnosis: Cognitive communication deficit (R41.841)    Follow Up Recommendations  None    Frequency and Duration           SLP Evaluation Cognition  Overall Cognitive Status: Within Functional Limits for tasks assessed Arousal/Alertness: Awake/alert Orientation Level: Oriented X4 Attention: Focused;Sustained Focused Attention: Appears intact Sustained Attention: Appears intact Memory: Impaired Memory Impairment: Retrieval deficit;Decreased recall of new information;Storage deficit (Immediate: 5/5; delayed: 3/5) Awareness: Appears intact Problem Solving: Appears intact Executive Function: Sequencing;Organizing Sequencing: Impaired Sequencing Impairment: Verbal complex (Clock drawing: 2/4) Organizing: Impaired Organizing Impairment: Verbal complex (Backward digit span: 1/2)       Comprehension  Auditory Comprehension Overall Auditory Comprehension: Appears within functional limits for tasks assessed Yes/No Questions: Within Functional Limits Commands: Within Functional Limits Conversation: Complex Reading Comprehension Reading Status: Within funtional limits    Expression Expression Primary Mode of Expression: Verbal Verbal Expression Overall Verbal Expression: Appears within functional limits for tasks assessed Initiation: No impairment Level of Generative/Spontaneous Verbalization: Conversation Repetition: No impairment Naming: No impairment Pragmatics: No impairment Written Expression Dominant Hand: Right   Oral / Motor  Oral Motor/Sensory Function Overall Oral Motor/Sensory Function: Within functional limits Motor Speech Overall Motor Speech: Appears within functional limits for tasks assessed Respiration: Within functional limits Phonation: Normal Resonance: Within functional  limits Articulation: Within functional limitis Intelligibility: Intelligible Motor Planning: Witnin functional limits Motor Speech Errors: Not applicable   Shanika I. Hardin Negus, [place], [place] Office number [phone] Pager [place] 03/19/2020, 9:57 AM

## 2020-03-19 NOTE — Progress Notes (Signed)
Benefits check in process for apixaban '5mg'$  twice a day, and 70/30 Novlog insulin 60units  twice a day. TOC team following... Whitman Hero RN,BSN,CM [phone]

## 2020-03-19 NOTE — Progress Notes (Addendum)
PROGRESS NOTE    Darryl Kelley  [id number] DOB: 1965-04-24 DOA: 03/16/2020 PCP: Gifford Shave, MD    Chief Complaint  Patient presents with  . Altered Mental Status  . Hyperglycemia    Brief Narrative:  Patient is a 55 year old gentleman history of end-stage renal disease on hemodialysis, who had missed hemodialysis session, presenting to the ED with altered mental status, noted to have blood glucose level > 1000, and noted to be in DKA.  Chest x-ray done concerning for right lower lobe opacity/pneumonia.  Patient placed on insulin drip, IV fluids, IV antibiotics, admitted to the ICU for DKA.  Patient noted to have elevated troponins.  Patient also noted to go into A. fib with RVR, placed on a Cardizem drip, received IV metoprolol x3, also placed on digoxin and subsequently converted back to sinus rhythm.  Patient also placed on anticoagulation with IV heparin.   Assessment & Plan:   Principal Problem:   DKA (diabetic ketoacidosis) ([place]) Active Problems:   Hyperosmolar hyperglycemic state (HHS) ([place])   Type 2 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 (HCC)   End stage renal disease (HCC)   Hypertension   Hypertensive heart and renal disease   Mixed hyperlipidemia   Paroxysmal atrial fibrillation (HCC)   Acute metabolic encephalopathy   Hypertensive urgency   PNA (pneumonia)   CAD (coronary artery disease)   Community acquired pneumonia  1 DKA/hyperosmolar hyperglycemic state Patient presented to the ED with acute metabolic encephalopathy, noted to have a glucose level > 1000, elevated beta hydroxybutyrate level of 6.74, sodium of 127, bicarb of 16, anion gap of 30.  VBG with a pH of 7.4, PCO2 of 24, O2 of 119, PCO2 of 17, bicarb of 16.2. -Patient noted to have been out of insulin for about 2 weeks secondary to financial issues/insurance. -Chest x-ray concerning for infectious etiology of possible pneumonia. -Troponin noted to be elevated on  presentation. -EKG with normal sinus rhythm with no ischemic changes noted. -Patient has has subsequently been transitioned off the insulin drip and on subcutaneous insulin NPH 50 units twice daily.  Patient also on meal coverage NovoLog 2 units 3 times daily with meals as well as sliding scale insulin. -Hemoglobin A1c > 15.5 -CBG 144 this morning. -Due to financial constraints TOC and diabetes coordinator following. -We will transition to Novolin 70/30 60 units twice daily and uptitrate as needed for better blood glucose control, which patient will likely be discharged home on.  -Change IV Reglan to oral Reglan.  -Supportive care.    2.  New onset A. fib with RVR/paroxysmal A. Fib CHA2DS2VASC score 2 -Patient noted to be in A. fib with RVR yesterday on maximum doses IV Cardizem drip, received IV metoprolol x3, placed on digoxin as well and has subsequently converted to normal sinus rhythm. -Patient did state in 2017 was admitted to the hospital in [place] with a similar presentation, at that time was placed on anticoagulation however since discharge from that hospital was not placed on anticoagulation. -2D echo with EF of 70 to 75%, NWMA, moderate LVH.  Grade 1 diastolic dysfunction. -Cardiac enzymes elevated blood flattened and decreased from admission.   -Currently in sinus rhythm.  Continue Coreg for rate control.  Transition from IV heparin to oral Eliquis.   -Patient seen in consultation by cardiology who are recommending need for anticoagulation going forward given a CHA2DS2-VASc score of at least 2 for hypertension and diabetes.   -Appreciate cardiology input and recommendations.   3.  Hypertensive  urgency Patient noted to have systolic blood pressures in the 200s. -Blood pressure improved on current regimen of Coreg 25 mg twice daily, Norvasc 10 mg daily, hydralazine 50 mg p.o. every 8 hours, Cozaar daily.   -Outpatient follow-up.   4.  Probable right lower lobe CAP, sepsis  ruled out -Chest x-ray on admission concerning for ill-defined right lower lobe opacity consistent with pneumonia. -SLP evaluated patient and patient with no increased risk of aspiration at this time. -SLP recommending continued current diet. -Continue empiric IV Rocephin and azithromycin. -Once nausea improves could likely transition to oral Augmentin in the next 1 to 2 days.   5.  Coronary artery disease/hyperlipidemia/elevated troponin Patient with history of coronary artery disease nonspecific chest pain on admission.  Patient noted to have elevated troponin of 584. -Repeat troponins elevated but trended down and flat. -2D echo with EF of 70 to 75%, NWMA, moderate LVH.  Grade 1 diastolic dysfunction.  -Continue aspirin, Coreg, Cozaar, hydralazine. -Cardiology consulted and following and feel no further invasive work-up needed at this time.  6.  End-stage renal disease on hemodialysis secondary to diabetes mellitus type 2/hypertension, Patient with history of end-stage renal disease on hemodialysis( [facility]) -Patient noted to have missed HD session 03/15/2020 and subsequently with AMS noted to be hypoglycemic with DKA/HHS. -Nephrology following. -HD per nephrology. -Electrolyte management per nephrology.  7.  Poorly controlled diabetes mellitus type 2/gastroparesis Patient currently on NPH 50 units twice daily.  Hemoglobin A1c > 15.5. -Patient noted to have financial issues with obtaining his insulin. -CBG 144 this morning.   -We will transition to Novolin 70/30 60 units twice daily and uptitrate as needed for better blood glucose control.   -Change IV Reglan to oral Reglan.   -Continue IV antiemetics as needed.  8.  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 -2D echo with EF of 70 to 75%, NWMA, moderate LVH.  Grade 1 diastolic dysfunction.  -Outpatient follow-up.  9.  Acute metabolic  encephalopathy Likely secondary to problem #1 and pneumonia in the setting of missed HD session. -Clinical improvement and likely close to baseline.  10.  Hypokalemia Patient with end-stage renal disease on hemodialysis.  Patient likely for dialysis today.  Electrolytes per nephrology.   DVT prophylaxis: Heparin drip>>> Eliquis Code Status: Full Family Communication: Updated patient.  No family at bedside. Disposition:   Status is: Inpatient    Dispo: The patient is from: Home              Anticipated d/c is to: Home              Patient currently was in A. fib with RVR, cardiology following, being transitioned from IV to oral medications.  On IV heparin being transitioned to oral anticoagulation.  Subcutaneous insulin being changed to 70/30.  Not stable for discharge.   Difficult to place patient no       Consultants:   PCCM admission  Cardiology: Dr. Margaretann Loveless 03/20/2020  Nephrology  Procedures:   2D echo 03/18/2020 pending.  Chest x-ray 03/16/2020 >>>> right lower lobe opacity  Antimicrobials:   IV azithromycin 03/16/2020>>>>  IV Rocephin 03/16/2020>>>>   Subjective: Patient sitting up at bedside with emesis bowl on his lap.  Complaining of some nausea but no emesis.  Denies any chest pain.  No shortness of breath.  Speech therapist at bedside.   Patient stating he was told by cardiologist he did  not need Eliquis.   Objective: Vitals:   03/19/20 0400 03/19/20 0500 03/19/20 0600 03/19/20 0700  BP: (!) 142/87 107/65 (!) 169/94 122/67  Pulse: 67 67 69 67  Resp: '11 12 12 10  '$ Temp:      TempSrc:      SpO2: 97% 96% 97% 97%  Weight:        Intake/Output Summary (Last 24 hours) at 03/19/2020 0936 Last data filed at 03/19/2020 0600 Gross per 24 hour  Intake 1101.51 ml  Output -  Net 1101.51 ml   Filed Weights   03/16/20 1826 03/17/20 0646 03/17/20 1051  Weight: 106 kg 108.2 kg 105.4 kg    Examination:  General exam: : NAD Respiratory system: CTA B.  No  wheezes, no rhonchi.  Speaking in full sentences.  Normal respiratory effort. Cardiovascular system: Regular rate and rhythm no murmurs rubs or gallops.  No JVD.  No lower extremity edema.  Gastrointestinal system: Abdomen soft, nontender, nondistended, positive bowel sounds.  No rebound.  No guarding. Central nervous system: Alert and oriented. No focal neurological deficits. Extremities: Symmetric 5 x 5 power. Skin: No rashes, lesions or ulcers Psychiatry: Judgement and insight appear normal. Mood & affect appropriate.    Data Reviewed: I have personally reviewed following labs and imaging studies  CBC: Recent Labs  Lab 03/16/20 1334 03/16/20 1423 03/16/20 1630 03/17/20 0453 03/17/20 1601 03/18/20 0509 03/19/20 0054  WBC 12.6*  --   --  8.9 11.1* 8.9 5.0  NEUTROABS 10.1*  --   --   --   --   --   --   HGB 11.7*   < > 12.2* 10.8* 12.8* 12.0* 11.2*  HCT 33.7*   < > 36.0* 29.0* 35.8* 33.3* 30.9*  MCV 86.2  --   --  80.8 81.9 83.7 83.1  PLT 171  --   --  136* 159 108* 83*   < > = values in this interval not displayed.    Basic Metabolic Panel: Recent Labs  Lab 03/17/20 0453 03/17/20 0836 03/17/20 1601 03/18/20 0509 03/19/20 0054  NA 135 138 137 138 134*  K 3.0* 3.0* 2.9* 3.6 3.2*  CL 92* 98 97* 98 98  CO2 '26 28 26 27 25  '$ GLUCOSE 615* 211* 223* 199* 171*  BUN 76* 39* 37* 43* 50*  CREATININE 12.29* 7.35* 7.94* 9.03* 10.06*  CALCIUM 8.7* 9.0 8.9 8.6* 8.0*  MG  --   --   --   --  2.3  PHOS  --   --  2.8  --   --     GFR: Estimated Creatinine Clearance: 9.9 mL/min (A) (by C-G formula based on SCr of 10.06 mg/dL (H)).  Liver Function Tests: Recent Labs  Lab 03/16/20 1334 03/17/20 1601  AST 29  --   ALT 27  --   ALKPHOS 85  --   BILITOT 2.7*  --   PROT 7.0  --   ALBUMIN 3.3* 3.1*    CBG: Recent Labs  Lab 03/18/20 1920 03/18/20 2134 03/18/20 2314 03/19/20 0304 03/19/20 0730  GLUCAP 188* 168* 164* 144* 113*     Recent Results (from the past 240  hour(s))  Culture, blood (Routine X 2) w Reflex to ID Panel     Status: None (Preliminary result)   Collection Time: 03/16/20  1:35 PM   Specimen: BLOOD  Result Value Ref Range Status   Specimen Description BLOOD RIGHT ANTECUBITAL  Final   Special Requests   Final  BOTTLES DRAWN AEROBIC AND ANAEROBIC Blood Culture results may not be optimal due to an inadequate volume of blood received in culture bottles   Culture   Final    NO GROWTH 3 DAYS Performed at [facility], [place] [address] [place], [place] [address]    Report Status PENDING  Incomplete  Culture, blood (Routine X 2) w Reflex to ID Panel     Status: None (Preliminary result)   Collection Time: 03/16/20  1:40 PM   Specimen: BLOOD  Result Value Ref Range Status   Specimen Description BLOOD RIGHT ANTECUBITAL  Final   Special Requests   Final    BOTTLES DRAWN AEROBIC AND ANAEROBIC Blood Culture results may not be optimal due to an inadequate volume of blood received in culture bottles   Culture   Final    NO GROWTH 3 DAYS Performed at [facility], [place] [address] [place], [place] [address]    Report Status PENDING  Incomplete  Resp Panel by RT-PCR (Flu A&B, Covid) Nasopharyngeal Swab     Status: None   Collection Time: 03/16/20  2:34 PM   Specimen: Nasopharyngeal Swab; Nasopharyngeal(NP) swabs in vial transport medium  Result Value Ref Range Status   SARS Coronavirus 2 by RT PCR NEGATIVE NEGATIVE Final    Comment: (NOTE) SARS-CoV-2 target nucleic acids are NOT DETECTED.  The SARS-CoV-2 RNA is generally detectable in upper respiratory specimens during the acute phase of infection. The lowest concentration of SARS-CoV-2 viral copies this assay can detect is 138 copies/mL. A negative result does not preclude SARS-Cov-2 infection and should not be used as the sole basis for treatment or other patient management decisions. A negative result may occur with  improper specimen collection/handling, submission of  specimen other than nasopharyngeal swab, presence of viral mutation(s) within the areas targeted by this assay, and inadequate number of viral copies(<138 copies/mL). A negative result must be combined with clinical observations, patient history, and epidemiological information. The expected result is Negative.  Fact Sheet for Patients:  EntrepreneurPulse.com.au  Fact Sheet for Healthcare Providers:  IncredibleEmployment.be  This test is no t yet approved or cleared by the [place] FDA and  has been authorized for detection and/or diagnosis of SARS-CoV-2 by FDA under an Emergency Use Authorization (EUA). This EUA will remain  in effect (meaning this test can be used) for the duration of the COVID-19 declaration under Section 564(b)(1) of the Act, 21 U.S.C.section 360bbb-3(b)(1), unless the authorization is terminated  or revoked sooner.       Influenza A by PCR NEGATIVE NEGATIVE Final   Influenza B by PCR NEGATIVE NEGATIVE Final    Comment: (NOTE) The Xpert Xpress SARS-CoV-2/FLU/RSV plus assay is intended as an aid in the diagnosis of influenza from Nasopharyngeal swab specimens and should not be used as a sole basis for treatment. Nasal washings and aspirates are unacceptable for Xpert Xpress SARS-CoV-2/FLU/RSV testing.  Fact Sheet for Patients: EntrepreneurPulse.com.au  Fact Sheet for Healthcare Providers: IncredibleEmployment.be  This test is not yet approved or cleared by the [place] FDA and has been authorized for detection and/or diagnosis of SARS-CoV-2 by FDA under an Emergency Use Authorization (EUA). This EUA will remain in effect (meaning this test can be used) for the duration of the COVID-19 declaration under Section 564(b)(1) of the Act, 21 U.S.C. section 360bbb-3(b)(1), unless the authorization is terminated or revoked.  Performed at [facility], [place] [address],  [place], [place] [address]   MRSA PCR  Screening     Status: None   Collection Time: 03/16/20  6:23 PM   Specimen: Nasal Mucosa; Nasopharyngeal  Result Value Ref Range Status   MRSA by PCR NEGATIVE NEGATIVE Final    Comment:        The GeneXpert MRSA Assay (FDA approved for NASAL specimens only), is one component of a comprehensive MRSA colonization surveillance program. It is not intended to diagnose MRSA infection nor to guide or monitor treatment for MRSA infections. Performed at [facility], [place] [address], [place], [place] [address]          Radiology Studies: ECHOCARDIOGRAM COMPLETE  Result Date: 03/18/2020    ECHOCARDIOGRAM REPORT   Patient Name:   VIJAY ABDUR-RAHEEM Date of Exam: 03/18/2020 Medical Rec #:  [id number]          Height:       69.0 in Accession #:    [id number]         Weight:       232.4 lb Date of Birth:  [birth year]           BSA:          2.202 m Patient Age:    32 years           BP:           163/68 mmHg Patient Gender: M                  HR:           73 bpm. Exam Location:  Inpatient Procedure: 2D Echo, Cardiac Doppler, Color Doppler and Intracardiac            Opacification Agent Indications:    I42.9 Cardiomyopathy (unspecified)  History:        Patient has no prior history of Echocardiogram examinations.                 Risk Factors:Hypertension and Diabetes. Kidney failure.  Sonographer:    Merrie Roof RDCS Referring Phys: [id number] [place]  1. There is an LVOT gradient of 53 mm Hg which increaed to 58 mmHg with Valsalva.     . Left ventricular ejection fraction, by estimation, is 70 to 75%. The left ventricle has hyperdynamic function. The left ventricle has no regional wall motion abnormalities. There is moderate left ventricular hypertrophy. Left ventricular diastolic parameters are consistent with Grade I diastolic dysfunction (impaired relaxation).  2. Right ventricular systolic function is normal. The right ventricular size is normal.   3. The mitral valve is normal in structure. No evidence of mitral valve regurgitation. No evidence of mitral stenosis.  4. The aortic valve is normal in structure. Aortic valve regurgitation is not visualized. No aortic stenosis is present. FINDINGS  Left Ventricle: There is an LVOT gradient of 53 mm Hg which increaed to 58 mmHg with Valsalva. Left ventricular ejection fraction, by estimation, is 70 to 75%. The left ventricle has hyperdynamic function. The left ventricle has no regional wall motion abnormalities. Definity contrast agent was given IV to delineate the left ventricular endocardial borders. The left ventricular internal cavity size was small. There is moderate left ventricular hypertrophy. Left ventricular diastolic parameters are consistent with Grade I diastolic dysfunction (impaired relaxation). Right Ventricle: The right ventricular size is normal. No increase in right ventricular wall thickness. Right ventricular systolic function is normal. Left Atrium: Left atrial size was normal in size. Right Atrium: Right atrial size was normal in size. Pericardium:  There is no evidence of pericardial effusion. Mitral Valve: The mitral valve is normal in structure. No evidence of mitral valve regurgitation. No evidence of mitral valve stenosis. Tricuspid Valve: The tricuspid valve is grossly normal. Tricuspid valve regurgitation is not demonstrated. Aortic Valve: The aortic valve is normal in structure. Aortic valve regurgitation is not visualized. No aortic stenosis is present. Aortic valve mean gradient measures 10.0 mmHg. Aortic valve peak gradient measures 16.3 mmHg. Pulmonic Valve: The pulmonic valve was normal in structure. Pulmonic valve regurgitation is not visualized. Aorta: The aortic root and ascending aorta are structurally normal, with no evidence of dilitation. IAS/Shunts: The atrial septum is grossly normal.  LEFT VENTRICLE PLAX 2D LVIDd:         3.80 cm  Diastology LVIDs:         2.60 cm  LV  e' medial:    6.42 cm/s LV PW:         1.40 cm  LV E/e' medial:  11.1 LV IVS:        1.50 cm  LV e' lateral:   6.64 cm/s LVOT diam:     2.10 cm  LV E/e' lateral: 10.8 LVOT Area:     3.46 cm  LEFT ATRIUM             Index LA diam:        3.90 cm 1.77 cm/m LA Vol (A2C):   96.9 ml 44.01 ml/m LA Vol (A4C):   88.4 ml 40.15 ml/m LA Biplane Vol: 95.0 ml 43.14 ml/m  AORTIC VALVE AV Vmax:      202.00 cm/s AV Vmean:     148.000 cm/s AV VTI:       0.370 m AV Peak Grad: 16.3 mmHg AV Mean Grad: 10.0 mmHg  AORTA Ao Root diam: 2.90 cm Ao Asc diam:  2.70 cm MITRAL VALVE MV Area (PHT): 1.72 cm     SHUNTS MV Decel Time: 440 msec     Systemic Diam: 2.10 cm MV E velocity: 71.40 cm/s MV A velocity: 131.00 cm/s MV E/A ratio:  0.55 Mertie Moores MD Electronically signed by Mertie Moores MD Signature Date/Time: 03/18/2020/11:04:29 AM    Final         Scheduled Meds: . amLODipine  10 mg Oral Daily  . apixaban  5 mg Oral BID  . aspirin EC  81 mg Oral Daily  . calcitRIOL  1.5 mcg Oral Q T,Th,Sat-1800  . carvedilol  25 mg Oral BID WC  . Chlorhexidine Gluconate Cloth  6 each Topical Q0600  . Chlorhexidine Gluconate Cloth  6 each Topical Q0600  . ezetimibe  10 mg Oral Daily  . famotidine  20 mg Oral Daily  . heparin  4,000 Units Dialysis Once in dialysis  . hydrALAZINE  50 mg Oral Q8H  . icosapent Ethyl  2 g Oral BID  . insulin aspart  0-6 Units Subcutaneous TID WC  . insulin aspart protamine- aspart  60 Units Subcutaneous BID WC  . losartan  50 mg Oral Daily  . mouth rinse  15 mL Mouth Rinse BID  . metoCLOPramide  5 mg Oral TID AC & HS  . pregabalin  75 mg Oral Daily  . rosuvastatin  10 mg Oral Daily   Continuous Infusions: . sodium chloride    . sodium chloride    . azithromycin 500 mg (03/18/20 1451)  . cefTRIAXone (ROCEPHIN)  IV 2 g (03/18/20 1414)  . heparin 1,550 Units/hr (03/19/20 0731)     LOS:  3 days    Time spent: 40 minutes    Irine Seal, MD [facility]   To contact the  attending provider between 7A-7P or the covering provider during after hours 7P-7A, please log into the web site [email or web address] and access using universal [place] password for that web site. If you do not have the password, please call the hospital operator.  03/19/2020, 9:36 AM

## 2020-03-19 NOTE — Progress Notes (Signed)
Progress Note  Patient Name: Darryl Kelley Date of Encounter: 03/19/2020  Primary Cardiologist: Rob Hickman- Can be Gasper Sells MD to assist with transition  Subjective   Spontaneous conversion ~ 1800 03/19/19.  No palpitations even in AF.  Is worried about eliquis:  He is afraid that this will keep him from renal transplant.  Social:  From [place]; has met Runner, broadcasting/film/video and Actor.  Inpatient Medications    Scheduled Meds: . amLODipine  10 mg Oral Daily  . apixaban  5 mg Oral BID  . calcitRIOL  1.5 mcg Oral Q T,Th,Sat-1800  . carvedilol  25 mg Oral BID WC  . Chlorhexidine Gluconate Cloth  6 each Topical Q0600  . Chlorhexidine Gluconate Cloth  6 each Topical Q0600  . ezetimibe  10 mg Oral Daily  . famotidine  20 mg Oral Daily  . heparin  4,000 Units Dialysis Once in dialysis  . hydrALAZINE  50 mg Oral Q8H  . icosapent Ethyl  2 g Oral BID  . insulin aspart  0-6 Units Subcutaneous TID WC  . insulin aspart protamine- aspart  60 Units Subcutaneous BID WC  . losartan  50 mg Oral Daily  . mouth rinse  15 mL Mouth Rinse BID  . metoCLOPramide  5 mg Oral TID AC & HS  . pregabalin  75 mg Oral Daily  . rosuvastatin  10 mg Oral Daily   Continuous Infusions: . sodium chloride    . sodium chloride    . azithromycin 500 mg (03/18/20 1451)  . cefTRIAXone (ROCEPHIN)  IV 2 g (03/18/20 1414)  . heparin 1,550 Units/hr (03/19/20 0731)  . ondansetron (ZOFRAN) IV     PRN Meds: sodium chloride, sodium chloride, acetaminophen, dextrose, labetalol, lidocaine (PF), lidocaine-prilocaine, metoprolol tartrate, ondansetron (ZOFRAN) IV, pentafluoroprop-tetrafluoroeth   Vital Signs    Vitals:   03/19/20 0400 03/19/20 0500 03/19/20 0600 03/19/20 0700  BP: (!) 142/87 107/65 (!) 169/94 122/67  Pulse: 67 67 69 67  Resp: 11 12 12 10   Temp:      TempSrc:      SpO2: 97% 96% 97% 97%  Weight:        Intake/Output Summary (Last 24 hours) at 03/19/2020 0958 Last data filed at 03/19/2020 0600 Gross  per 24 hour  Intake 1101.51 ml  Output --  Net 1101.51 ml   Filed Weights   03/16/20 1826 03/17/20 0646 03/17/20 1051  Weight: 106 kg 108.2 kg 105.4 kg    Telemetry    Spontaneous conversion to SR - Personally Reviewed  ECG    No new last 24 hours - Personally Reviewed  Physical Exam   GEN: No acute distress.   Neck: No JVD Cardiac: RRR, systolic crescendo that increases with hand grip, no rubs or gallops  Respiratory: Clear to auscultation bilaterally. GI: Soft, nontender, non-distended  MS: No edema; No deformity. Neuro:  Nonfocal  Psych: Normal affect   Labs    Chemistry Recent Labs  Lab 03/16/20 1334 03/16/20 1423 03/17/20 1601 03/18/20 0509 03/19/20 0054  NA 127*   < > 137 138 134*  K 4.4   < > 2.9* 3.6 3.2*  CL 81*   < > 97* 98 98  CO2 16*   < > 26 27 25   GLUCOSE 1,186*   < > 223* 199* 171*  BUN 67*   < > 37* 43* 50*  CREATININE 12.16*   < > 7.94* 9.03* 10.06*  CALCIUM 8.6*   < > 8.9 8.6* 8.0*  PROT 7.0  --   --   --   --  ALBUMIN 3.3*  --  3.1*  --   --   AST 29  --   --   --   --   ALT 27  --   --   --   --   ALKPHOS 85  --   --   --   --   BILITOT 2.7*  --   --   --   --   GFRNONAA 4*   < > 7* 6* 6*  ANIONGAP 30*   < > 14 13 11    < > = values in this interval not displayed.     Hematology Recent Labs  Lab 03/17/20 1601 03/18/20 0509 03/19/20 0054  WBC 11.1* 8.9 5.0  RBC 4.37 3.98* 3.72*  HGB 12.8* 12.0* 11.2*  HCT 35.8* 33.3* 30.9*  MCV 81.9 83.7 83.1  MCH 29.3 30.2 30.1  MCHC 35.8 36.0 36.2*  RDW 11.5 11.8 11.5  PLT 159 108* 83*    Cardiac EnzymesNo results for input(s): TROPONINI in the last 168 hours. No results for input(s): TROPIPOC in the last 168 hours.   BNPNo results for input(s): BNP, PROBNP in the last 168 hours.   DDimer No results for input(s): DDIMER in the last 168 hours.   Radiology    ECHOCARDIOGRAM COMPLETE  Result Date: 03/18/2020    ECHOCARDIOGRAM REPORT   Patient Name:   Darryl Kelley Date of  Exam: 03/18/2020 Medical Rec #:  [id number]          Height:       69.0 in Accession #:    [id number]         Weight:       232.4 lb Date of Birth:  05-30-1965           BSA:          2.202 m Patient Age:    55 years           BP:           163/68 mmHg Patient Gender: M                  HR:           73 bpm. Exam Location:  Inpatient Procedure: 2D Echo, Cardiac Doppler, Color Doppler and Intracardiac            Opacification Agent Indications:    I42.9 Cardiomyopathy (unspecified)  History:        Patient has no prior history of Echocardiogram examinations.                 Risk Factors:Hypertension and Diabetes. Kidney failure.  Sonographer:    Merrie Roof RDCS Referring Phys: [id number] [place]  1. There is an LVOT gradient of 53 mm Hg which increaed to 58 mmHg with Valsalva.     . Left ventricular ejection fraction, by estimation, is 70 to 75%. The left ventricle has hyperdynamic function. The left ventricle has no regional wall motion abnormalities. There is moderate left ventricular hypertrophy. Left ventricular diastolic parameters are consistent with Grade I diastolic dysfunction (impaired relaxation).  2. Right ventricular systolic function is normal. The right ventricular size is normal.  3. The mitral valve is normal in structure. No evidence of mitral valve regurgitation. No evidence of mitral stenosis.  4. The aortic valve is normal in structure. Aortic valve regurgitation is not visualized. No aortic stenosis is present. FINDINGS  Left Ventricle: There is an LVOT gradient of 53 mm Hg  which increaed to 58 mmHg with Valsalva. Left ventricular ejection fraction, by estimation, is 70 to 75%. The left ventricle has hyperdynamic function. The left ventricle has no regional wall motion abnormalities. Definity contrast agent was given IV to delineate the left ventricular endocardial borders. The left ventricular internal cavity size was small. There is moderate left ventricular hypertrophy. Left  ventricular diastolic parameters are consistent with Grade I diastolic dysfunction (impaired relaxation). Right Ventricle: The right ventricular size is normal. No increase in right ventricular wall thickness. Right ventricular systolic function is normal. Left Atrium: Left atrial size was normal in size. Right Atrium: Right atrial size was normal in size. Pericardium: There is no evidence of pericardial effusion. Mitral Valve: The mitral valve is normal in structure. No evidence of mitral valve regurgitation. No evidence of mitral valve stenosis. Tricuspid Valve: The tricuspid valve is grossly normal. Tricuspid valve regurgitation is not demonstrated. Aortic Valve: The aortic valve is normal in structure. Aortic valve regurgitation is not visualized. No aortic stenosis is present. Aortic valve mean gradient measures 10.0 mmHg. Aortic valve peak gradient measures 16.3 mmHg. Pulmonic Valve: The pulmonic valve was normal in structure. Pulmonic valve regurgitation is not visualized. Aorta: The aortic root and ascending aorta are structurally normal, with no evidence of dilitation. IAS/Shunts: The atrial septum is grossly normal.  LEFT VENTRICLE PLAX 2D LVIDd:         3.80 cm  Diastology LVIDs:         2.60 cm  LV e' medial:    6.42 cm/s LV PW:         1.40 cm  LV E/e' medial:  11.1 LV IVS:        1.50 cm  LV e' lateral:   6.64 cm/s LVOT diam:     2.10 cm  LV E/e' lateral: 10.8 LVOT Area:     3.46 cm  LEFT ATRIUM             Index LA diam:        3.90 cm 1.77 cm/m LA Vol (A2C):   96.9 ml 44.01 ml/m LA Vol (A4C):   88.4 ml 40.15 ml/m LA Biplane Vol: 95.0 ml 43.14 ml/m  AORTIC VALVE AV Vmax:      202.00 cm/s AV Vmean:     148.000 cm/s AV VTI:       0.370 m AV Peak Grad: 16.3 mmHg AV Mean Grad: 10.0 mmHg  AORTA Ao Root diam: 2.90 cm Ao Asc diam:  2.70 cm MITRAL VALVE MV Area (PHT): 1.72 cm     SHUNTS MV Decel Time: 440 msec     Systemic Diam: 2.10 cm MV E velocity: 71.40 cm/s MV A velocity: 131.00 cm/s MV E/A ratio:   0.55 Mertie Moores MD Electronically signed by Mertie Moores MD Signature Date/Time: 03/18/2020/11:04:29 AM    Final     Cardiac Studies   LVOTO from 03/19/19 40 mm Hg but increased with Valsalva  Patient Profile     55 y.o. 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 and ASA (no PAD, obstructive CAD, Stroke); could DC ASA from home list  Hx of endocarditis - needs Augmentin 2 g PO PRN Dental work or equivalent  HLD- continue home statin  Will arrange follow up with me short term until return to [place] peripheral weekend follow; please call with questions or concerns.  For questions or updates, please contact [place] Please consult [email or web address] for contact info under Cardiology/STEMI.      Rudean Haskell, MD [address], [address] [place], [place] [address] [phone]  10:11 AM

## 2020-03-20 LAB — CBC
HCT: 30.6 % — ABNORMAL LOW (ref 39.0–52.0)
Hemoglobin: 11.2 g/dL — ABNORMAL LOW (ref 13.0–17.0)
MCH: 30.4 pg (ref 26.0–34.0)
MCHC: 36.6 g/dL — ABNORMAL HIGH (ref 30.0–36.0)
MCV: 82.9 fL (ref 80.0–100.0)
Platelets: 87 10*3/uL — ABNORMAL LOW (ref 150–400)
RBC: 3.69 MIL/uL — ABNORMAL LOW (ref 4.22–5.81)
RDW: 11.9 % (ref 11.5–15.5)
WBC: 5.5 10*3/uL (ref 4.0–10.5)
nRBC: 0 % (ref 0.0–0.2)

## 2020-03-20 LAB — RENAL FUNCTION PANEL
Albumin: 2.6 g/dL — ABNORMAL LOW (ref 3.5–5.0)
Anion gap: 12 (ref 5–15)
BUN: 29 mg/dL — ABNORMAL HIGH (ref 6–20)
CO2: 27 mmol/L (ref 22–32)
Calcium: 8.5 mg/dL — ABNORMAL LOW (ref 8.9–10.3)
Chloride: 98 mmol/L (ref 98–111)
Creatinine, Ser: 7.7 mg/dL — ABNORMAL HIGH (ref 0.61–1.24)
GFR, Estimated: 8 mL/min — ABNORMAL LOW (ref >60)
Glucose, Bld: 72 mg/dL (ref 70–99)
Phosphorus: 3.8 mg/dL (ref 2.5–4.6)
Potassium: 3.6 mmol/L (ref 3.5–5.1)
Sodium: 137 mmol/L (ref 135–145)

## 2020-03-20 LAB — GLUCOSE, CAPILLARY
Glucose-Capillary: 113 mg/dL — ABNORMAL HIGH (ref 70–99)
Glucose-Capillary: 182 mg/dL — ABNORMAL HIGH (ref 70–99)
Glucose-Capillary: 130 mg/dL — ABNORMAL HIGH (ref 70–99)

## 2020-03-20 MED ORDER — METOCLOPRAMIDE HCL 5 MG PO TABS
10.0000 mg | ORAL_TABLET | Freq: Three times a day (TID) | ORAL | Status: DC
  Administered 2020-03-20 – 2020-03-21 (×4): 10 mg via ORAL
  Filled 2020-03-20 (×4): qty 2

## 2020-03-20 NOTE — Progress Notes (Signed)
[facility] Progress Note   Assessment/ Plan:   Dialysis Orders:[facility] T,Th,S 4 hrs 15 min 200NRe 500/500 2.0K/2.0 Ca UFP 4 AVG -Heparin 4000 units IV TIW -Calcitriol 1.5 mcg PO TIW  Assessment/Plan:  1. DKA-BS 1186 AG >30. Resolved.  Getting 70/30 insulin for cost effectiveness    2. RRL PNA-BC drawn, started on ABX. Per primary. 3. AMS: Resolved 4. ESRD - T,Th, S via AVG.    S/p HD 3/18 with resumption of regular schedule 3/19. 5. Hypertension/volume - Very hypertensive on admission. No evidence of volume excess by exam. On amlodipine 10 mg po q HS and Losartan 50 mg PO at home.  Have been restarted  Left under EDW last treatment.He appears to be losing body weight.Will need to lower volume in HD .  6. Anemia - HGB at goal. Last Iron panel at HD unit 03/11/20 replete. Follow HGB. 7. Metabolic bone disease - NPO at present. Resume VDRA, binders when able to eat. 8. Nutrition - NPO at present 9. H/OHx endocarditis with IVDA 10. H/O Hep C. 11. H/O gastroparesis. Was referred by Dr. Joelyn Oms to GI 03/11/20. No notes in Epic that he has been to GI yet.   Subjective:    HD yesterday and plan for short rx today to get back on schedule.     Objective:   BP (!) 142/71 (BP Location: Right Arm)   Pulse 77   Temp 97.9 F (36.6 C) (Oral)   Resp 18   Wt 109.1 kg   SpO2 100%   BMI 35.52 kg/m   Physical Exam: Gen: NAD, sitting in bed CVS: irregular Resp: clear Abd: soft Ext: L AVG + T/B  Labs: BMET Recent Labs  Lab 03/17/20 0017 03/17/20 0453 03/17/20 0836 03/17/20 1601 03/18/20 0509 03/19/20 0054 03/20/20 0249  NA 130* 135 138 137 138 134* 137  K 3.5 3.0* 3.0* 2.9* 3.6 3.2* 3.6  CL 88* 92* 98 97* 98 98 98  CO2 '23 26 28 26 27 25 27  '$ GLUCOSE 907* 615* 211* 223* 199* 171* 72  BUN 75* 76* 39* 37* 43* 50* 29*  CREATININE 12.09* 12.29* 7.35* 7.94* 9.03* 10.06* 7.70*  CALCIUM 8.7* 8.7* 9.0 8.9 8.6* 8.0* 8.5*  PHOS  --   --   --  2.8  --    --  3.8   CBC Recent Labs  Lab 03/16/20 1334 03/16/20 1423 03/17/20 1601 03/18/20 0509 03/19/20 0054 03/20/20 0249  WBC 12.6*   < > 11.1* 8.9 5.0 5.5  NEUTROABS 10.1*  --   --   --   --   --   HGB 11.7*   < > 12.8* 12.0* 11.2* 11.2*  HCT 33.7*   < > 35.8* 33.3* 30.9* 30.6*  MCV 86.2   < > 81.9 83.7 83.1 82.9  PLT 171   < > 159 108* 83* 87*   < > = values in this interval not displayed.      Medications:    . amLODipine  10 mg Oral Daily  . apixaban  5 mg Oral BID  . azithromycin  500 mg Oral Daily  . calcitRIOL  1.5 mcg Oral Q T,Th,Sat-1800  . carvedilol  25 mg Oral BID WC  . Chlorhexidine Gluconate Cloth  6 each Topical Q0600  . Chlorhexidine Gluconate Cloth  6 each Topical Q0600  . ezetimibe  10 mg Oral Daily  . famotidine  20 mg Oral Daily  . hydrALAZINE  50 mg Oral Q8H  .  icosapent Ethyl  2 g Oral BID  . insulin aspart  0-6 Units Subcutaneous TID WC  . insulin aspart protamine- aspart  60 Units Subcutaneous BID WC  . losartan  50 mg Oral Daily  . mouth rinse  15 mL Mouth Rinse BID  . metoCLOPramide  5 mg Oral TID AC & HS  . pregabalin  75 mg Oral Daily  . rosuvastatin  10 mg Oral Daily     Madelon Lips, MD 03/20/2020, 11:07 AM

## 2020-03-20 NOTE — Progress Notes (Signed)
Pt refused rosuvastatin due to allergy.

## 2020-03-20 NOTE — Progress Notes (Signed)
PROGRESS NOTE    Darryl Kelley  [id number] DOB: 17-Jun-1965 DOA: 03/16/2020 PCP: Gifford Shave, MD    Chief Complaint  Patient presents with  . Altered Mental Status  . Hyperglycemia    Brief Narrative:  Patient is a 55 year old gentleman history of end-stage renal disease on hemodialysis, who had missed hemodialysis session, presenting to the ED with altered mental status, noted to have blood glucose level > 1000, and noted to be in DKA.  Chest x-ray done concerning for right lower lobe opacity/pneumonia.  Patient placed on insulin drip, IV fluids, IV antibiotics, admitted to the ICU for DKA.  Patient noted to have elevated troponins.  Patient also noted to go into A. fib with RVR, placed on a Cardizem drip, received IV metoprolol x3, also placed on digoxin and subsequently converted back to sinus rhythm.  Patient also placed on anticoagulation with IV heparin.   Assessment & Plan:   Principal Problem:   DKA (diabetic ketoacidosis) ([place]) Active Problems:   Hyperosmolar hyperglycemic state (HHS) ([place])   Type 2 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 (HCC)   End stage renal disease (HCC)   Hypertension   Hypertensive heart and renal disease   Mixed hyperlipidemia   Paroxysmal atrial fibrillation (HCC)   Acute metabolic encephalopathy   Hypertensive urgency   PNA (pneumonia)   CAD (coronary artery disease)   Community acquired pneumonia   Corrected TGA/IVS/LVOTO-transpos, intact vent sept, LV outflo obstruct  1 DKA/hyperosmolar hyperglycemic state Patient presented to the ED with acute metabolic encephalopathy, noted to have a glucose level > 1000, elevated beta hydroxybutyrate level of 6.74, sodium of 127, bicarb of 16, anion gap of 30.  VBG with a pH of 7.4, PCO2 of 24, O2 of 119, PCO2 of 17, bicarb of 16.2. -Patient noted to have been out of insulin for about 2 weeks secondary to financial issues/insurance. -Chest x-ray concerning for infectious  etiology of possible pneumonia. -Troponin noted to be elevated on presentation. -EKG with normal sinus rhythm with no ischemic changes noted. -Patient has has subsequently been transitioned off the insulin drip and was on subcutaneous insulin NPH 50 units twice daily and meal coverage NovoLog as well as sliding scale insulin..  -Hemoglobin A1c > 15.5 -CBG 113 this morning. -Due to financial constraints TOC and diabetes coordinator following. -Patient transition from NPH to 70/3060 units twice daily for glucose control which patient will likely be discharged home on.  -Continue Reglan. -Supportive care.    2.  New onset A. fib with RVR/paroxysmal A. Fib CHA2DS2VASC score 2 -Patient noted to be in A. fib with RVR early on during the hospitalization, on maximum doses IV Cardizem drip, received IV metoprolol x3, placed on digoxin as well and has subsequently converted to normal sinus rhythm. -Patient did state in 2017 was admitted to the hospital in [place] with a similar presentation, at that time was placed on anticoagulation however since discharge from that hospital was not placed on anticoagulation. -2D echo with EF of 70 to 75%, NWMA, moderate LVH.  Grade 1 diastolic dysfunction. -Cardiac enzymes elevated but flattened and decreased from admission.   -In NSR.   -Continue Coreg for rate control.   -Patient has been transitioned from IV heparin to oral Eliquis which he is tolerating.   -Patient seen in consultation by cardiology who are recommending need for anticoagulation going forward given a CHA2DS2-VASc score of at least 2 for hypertension and diabetes.   -Appreciate cardiology input and recommendations.   3.  Hypertensive urgency Patient noted to have systolic blood pressures in the 200s. -Blood pressure improved on current regimen of Coreg 25 mg twice daily, Norvasc 10 mg daily, hydralazine 50 mg p.o. every 8 hours, Cozaar daily.   -Outpatient follow-up.   4.  Probable right  lower lobe CAP, sepsis ruled out -Chest x-ray on admission concerning for ill-defined right lower lobe opacity consistent with pneumonia. -SLP evaluated patient and patient with no increased risk of aspiration at this time. -SLP recommending continued current diet. -Continue empiric IV Rocephin and azithromycin. -Once nausea improves could likely transition to oral Augmentin to complete course of antibiotic treatment.    5.  Coronary artery disease/hyperlipidemia/elevated troponin Patient with history of coronary artery disease nonspecific chest pain on admission.  Patient noted to have elevated troponin of 584. -Repeat troponins elevated but trended down and flat. -2D echo with EF of 70 to 75%, NWMA, moderate LVH.  Grade 1 diastolic dysfunction.  -Continue aspirin, Coreg, Cozaar, hydralazine. -Cardiology consulted and following and feel no further invasive work-up needed at this time.  6.  End-stage renal disease on hemodialysis secondary to diabetes mellitus type 2/hypertension, Patient with history of end-stage renal disease on hemodialysis( [facility]) -Patient noted to have missed HD session 03/15/2020 and subsequently with AMS noted to be hypoglycemic with DKA/HHS. -Nephrology following. -HD per nephrology.  7.  Poorly controlled diabetes mellitus type 2/gastroparesis Patient currently on NPH 50 units twice daily.  Hemoglobin A1c > 15.5. -Patient noted to have financial issues with obtaining his insulin. -CBG 113 this morning.   -Patient transition to Novolin 70/30 60 units twice daily due to financial constraints.   -Patient still with nausea.  Increase Reglan to 10 mg 3 times daily.   -IV antiemetics as needed.   8.  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 -2D echo with EF of 70 to 75%, NWMA, moderate LVH.  Grade 1 diastolic dysfunction.  -Outpatient follow-up.  9.  Acute metabolic  encephalopathy Likely secondary to problem #1 and pneumonia in the setting of missed HD session. -Improved.  Currently at baseline.   10.  Hypokalemia Patient with end-stage renal disease on hemodialysis.  Patient for HD today.  Electrolyte management per nephrology.     DVT prophylaxis: Eliquis Code Status: Full Family Communication: Updated patient.  No family at bedside. Disposition:   Status is: Inpatient    Dispo: The patient is from: Home              Anticipated d/c is to: Home              Patient currently was in A. fib with RVR, cardiology following, being transitioned from IV to oral medications.  Transition from IV heparin to Eliquis.  On subcutaneous insulin 70/30.  For hemodialysis today.  Not stable for discharge.    Difficult to place patient no       Consultants:   PCCM admission  Cardiology: Dr. Margaretann Loveless 03/20/2020  Nephrology  Procedures:   2D echo 03/18/2020  Chest x-ray 03/16/2020 >>>> right lower lobe opacity  Antimicrobials:   IV azithromycin 03/16/2020>>>>  IV Rocephin 03/16/2020>>>>   Subjective: Patient sleeping but arousable.  Denies any shortness of breath.  No chest pain.  No bleeding.  No emesis did have some nausea this morning.  Stated tolerated breakfast.    Objective: Vitals:   03/20/20 0412 03/20/20 0900 03/20/20 0918 03/20/20 1151  BP: Marland Kitchen)  161/86 (!) 142/71 (!) 142/71 104/72  Pulse: 70 71 77 82  Resp: '20  18 18  '$ Temp: 98.2 F (36.8 C)  97.9 F (36.6 C) 98.7 F (37.1 C)  TempSrc: Oral  Oral Oral  SpO2: 100%  100% 97%  Weight:        Intake/Output Summary (Last 24 hours) at 03/20/2020 1420 Last data filed at 03/20/2020 1200 Gross per 24 hour  Intake 480 ml  Output 2600 ml  Net -2120 ml   Filed Weights   03/19/20 1115 03/19/20 1515 03/20/20 0100  Weight: 109.2 kg 106.6 kg 109.1 kg    Examination:  General exam: : NAD Respiratory system: CTA B.  No wheezes, no rhonchi.  Speaking in full sentences.  Normal  respiratory effort. Cardiovascular system: Regular rate and rhythm no murmurs rubs or gallops.  No JVD.  No lower extremity edema.  Gastrointestinal system: Abdomen soft, nontender, nondistended, positive bowel sounds.  No rebound.  No guarding. Central nervous system: Alert and oriented. No focal neurological deficits. Extremities: Symmetric 5 x 5 power. Skin: No rashes, lesions or ulcers Psychiatry: Judgement and insight appear normal. Mood & affect appropriate.  Data Reviewed: I have personally reviewed following labs and imaging studies  CBC: Recent Labs  Lab 03/16/20 1334 03/16/20 1423 03/17/20 0453 03/17/20 1601 03/18/20 0509 03/19/20 0054 03/20/20 0249  WBC 12.6*  --  8.9 11.1* 8.9 5.0 5.5  NEUTROABS 10.1*  --   --   --   --   --   --   HGB 11.7*   < > 10.8* 12.8* 12.0* 11.2* 11.2*  HCT 33.7*   < > 29.0* 35.8* 33.3* 30.9* 30.6*  MCV 86.2  --  80.8 81.9 83.7 83.1 82.9  PLT 171  --  136* 159 108* 83* 87*   < > = values in this interval not displayed.    Basic Metabolic Panel: Recent Labs  Lab 03/17/20 0836 03/17/20 1601 03/18/20 0509 03/19/20 0054 03/20/20 0249  NA 138 137 138 134* 137  K 3.0* 2.9* 3.6 3.2* 3.6  CL 98 97* 98 98 98  CO2 '28 26 27 25 27  '$ GLUCOSE 211* 223* 199* 171* 72  BUN 39* 37* 43* 50* 29*  CREATININE 7.35* 7.94* 9.03* 10.06* 7.70*  CALCIUM 9.0 8.9 8.6* 8.0* 8.5*  MG  --   --   --  2.3  --   PHOS  --  2.8  --   --  3.8    GFR: Estimated Creatinine Clearance: 13.2 mL/min (A) (by C-G formula based on SCr of 7.7 mg/dL (H)).  Liver Function Tests: Recent Labs  Lab 03/16/20 1334 03/17/20 1601 03/20/20 0249  AST 29  --   --   ALT 27  --   --   ALKPHOS 85  --   --   BILITOT 2.7*  --   --   PROT 7.0  --   --   ALBUMIN 3.3* 3.1* 2.6*    CBG: Recent Labs  Lab 03/19/20 1536 03/19/20 1752 03/19/20 2104 03/20/20 0614 03/20/20 1149  GLUCAP 92 156* 188* 113* 182*     Recent Results (from the past 240 hour(s))  Culture, blood  (Routine X 2) w Reflex to ID Panel     Status: None (Preliminary result)   Collection Time: 03/16/20  1:35 PM   Specimen: BLOOD  Result Value Ref Range Status   Specimen Description BLOOD RIGHT ANTECUBITAL  Final   Special Requests   Final  BOTTLES DRAWN AEROBIC AND ANAEROBIC Blood Culture results may not be optimal due to an inadequate volume of blood received in culture bottles   Culture   Final    NO GROWTH 4 DAYS Performed at [facility], [place] [address], [place], [place] [address]    Report Status PENDING  Incomplete  Culture, blood (Routine X 2) w Reflex to ID Panel     Status: None (Preliminary result)   Collection Time: 03/16/20  1:40 PM   Specimen: BLOOD  Result Value Ref Range Status   Specimen Description BLOOD RIGHT ANTECUBITAL  Final   Special Requests   Final    BOTTLES DRAWN AEROBIC AND ANAEROBIC Blood Culture results may not be optimal due to an inadequate volume of blood received in culture bottles   Culture   Final    NO GROWTH 4 DAYS Performed at [facility], [place] [address] [place], [place] [address]    Report Status PENDING  Incomplete  Resp Panel by RT-PCR (Flu A&B, Covid) Nasopharyngeal Swab     Status: None   Collection Time: 03/16/20  2:34 PM   Specimen: Nasopharyngeal Swab; Nasopharyngeal(NP) swabs in vial transport medium  Result Value Ref Range Status   SARS Coronavirus 2 by RT PCR NEGATIVE NEGATIVE Final    Comment: (NOTE) SARS-CoV-2 target nucleic acids are NOT DETECTED.  The SARS-CoV-2 RNA is generally detectable in upper respiratory specimens during the acute phase of infection. The lowest concentration of SARS-CoV-2 viral copies this assay can detect is 138 copies/mL. A negative result does not preclude SARS-Cov-2 infection and should not be used as the sole basis for treatment or other patient management decisions. A negative result may occur with  improper specimen collection/handling, submission of specimen other than  nasopharyngeal swab, presence of viral mutation(s) within the areas targeted by this assay, and inadequate number of viral copies(<138 copies/mL). A negative result must be combined with clinical observations, patient history, and epidemiological information. The expected result is Negative.  Fact Sheet for Patients:  EntrepreneurPulse.com.au  Fact Sheet for Healthcare Providers:  IncredibleEmployment.be  This test is no t yet approved or cleared by the [place] FDA and  has been authorized for detection and/or diagnosis of SARS-CoV-2 by FDA under an Emergency Use Authorization (EUA). This EUA will remain  in effect (meaning this test can be used) for the duration of the COVID-19 declaration under Section 564(b)(1) of the Act, 21 U.S.C.section 360bbb-3(b)(1), unless the authorization is terminated  or revoked sooner.       Influenza A by PCR NEGATIVE NEGATIVE Final   Influenza B by PCR NEGATIVE NEGATIVE Final    Comment: (NOTE) The Xpert Xpress SARS-CoV-2/FLU/RSV plus assay is intended as an aid in the diagnosis of influenza from Nasopharyngeal swab specimens and should not be used as a sole basis for treatment. Nasal washings and aspirates are unacceptable for Xpert Xpress SARS-CoV-2/FLU/RSV testing.  Fact Sheet for Patients: EntrepreneurPulse.com.au  Fact Sheet for Healthcare Providers: IncredibleEmployment.be  This test is not yet approved or cleared by the [place] FDA and has been authorized for detection and/or diagnosis of SARS-CoV-2 by FDA under an Emergency Use Authorization (EUA). This EUA will remain in effect (meaning this test can be used) for the duration of the COVID-19 declaration under Section 564(b)(1) of the Act, 21 U.S.C. section 360bbb-3(b)(1), unless the authorization is terminated or revoked.  Performed at [facility], [place] [address] [place], [place] [address]    MRSA PCR  Screening     Status: None   Collection Time: 03/16/20  6:23 PM   Specimen: Nasal Mucosa; Nasopharyngeal  Result Value Ref Range Status   MRSA by PCR NEGATIVE NEGATIVE Final    Comment:        The GeneXpert MRSA Assay (FDA approved for NASAL specimens only), is one component of a comprehensive MRSA colonization surveillance program. It is not intended to diagnose MRSA infection nor to guide or monitor treatment for MRSA infections. Performed at [facility], [place] [address] [place], [place] [address]          Radiology Studies: No results found.      Scheduled Meds: . amLODipine  10 mg Oral Daily  . apixaban  5 mg Oral BID  . azithromycin  500 mg Oral Daily  . calcitRIOL  1.5 mcg Oral Q T,Th,Sat-1800  . carvedilol  25 mg Oral BID WC  . Chlorhexidine Gluconate Cloth  6 each Topical Q0600  . Chlorhexidine Gluconate Cloth  6 each Topical Q0600  . ezetimibe  10 mg Oral Daily  . famotidine  20 mg Oral Daily  . hydrALAZINE  50 mg Oral Q8H  . icosapent Ethyl  2 g Oral BID  . insulin aspart  0-6 Units Subcutaneous TID WC  . insulin aspart protamine- aspart  60 Units Subcutaneous BID WC  . losartan  50 mg Oral Daily  . mouth rinse  15 mL Mouth Rinse BID  . metoCLOPramide  5 mg Oral TID AC & HS  . pregabalin  75 mg Oral Daily  . rosuvastatin  10 mg Oral Daily   Continuous Infusions: . sodium chloride    . sodium chloride    . cefTRIAXone (ROCEPHIN)  IV 2 g (03/19/20 1648)     LOS: 4 days    Time spent: 40 minutes    Irine Seal, MD [facility]   To contact the attending provider between 7A-7P or the covering provider during after hours 7P-7A, please log into the web site [email or web address] and access using universal [place] password for that web site. If you do not have the password, please call the hospital operator.  03/20/2020, 2:20 PM

## 2020-03-21 ENCOUNTER — Encounter (HOSPITAL_COMMUNITY): Payer: Self-pay | Admitting: Internal Medicine

## 2020-03-21 DIAGNOSIS — E1100 Type 2 diabetes mellitus with hyperosmolarity without nonketotic hyperglycemic-hyperosmolar coma (NKHHC): Secondary | ICD-10-CM | POA: Diagnosis not present

## 2020-03-21 DIAGNOSIS — J189 Pneumonia, unspecified organism: Secondary | ICD-10-CM | POA: Diagnosis not present

## 2020-03-21 DIAGNOSIS — E1110 Type 2 diabetes mellitus with ketoacidosis without coma: Secondary | ICD-10-CM | POA: Diagnosis not present

## 2020-03-21 DIAGNOSIS — N186 End stage renal disease: Secondary | ICD-10-CM | POA: Diagnosis not present

## 2020-03-21 DIAGNOSIS — G9341 Metabolic encephalopathy: Secondary | ICD-10-CM | POA: Diagnosis not present

## 2020-03-21 DIAGNOSIS — E1010 Type 1 diabetes mellitus with ketoacidosis without coma: Secondary | ICD-10-CM | POA: Diagnosis not present

## 2020-03-21 DIAGNOSIS — I25119 Atherosclerotic heart disease of native coronary artery with unspecified angina pectoris: Secondary | ICD-10-CM

## 2020-03-21 DIAGNOSIS — I160 Hypertensive urgency: Secondary | ICD-10-CM | POA: Diagnosis not present

## 2020-03-21 DIAGNOSIS — I10 Essential (primary) hypertension: Secondary | ICD-10-CM | POA: Diagnosis not present

## 2020-03-21 DIAGNOSIS — I480 Paroxysmal atrial fibrillation: Secondary | ICD-10-CM | POA: Diagnosis not present

## 2020-03-21 LAB — GLUCOSE, CAPILLARY
Glucose-Capillary: 194 mg/dL — ABNORMAL HIGH (ref 70–99)
Glucose-Capillary: 206 mg/dL — ABNORMAL HIGH (ref 70–99)
Glucose-Capillary: 79 mg/dL (ref 70–99)

## 2020-03-21 MED ORDER — INSULIN ASPART PROT & ASPART (70-30 MIX) 100 UNIT/ML ~~LOC~~ SUSP: 60.0000 [IU] | Freq: Two times a day (BID) | SUBCUTANEOUS | 1 refills | Status: DC

## 2020-03-21 MED ORDER — METOCLOPRAMIDE HCL 10 MG PO TABS: 10.0000 mg | ORAL_TABLET | Freq: Three times a day (TID) | ORAL | 1 refills | Status: DC

## 2020-03-21 MED ORDER — AMLODIPINE BESYLATE 5 MG PO TABS: 10.0000 mg | ORAL_TABLET | Freq: Every day | ORAL | Status: DC

## 2020-03-21 MED ORDER — HYDRALAZINE HCL 50 MG PO TABS: 50.0000 mg | ORAL_TABLET | Freq: Three times a day (TID) | ORAL | 1 refills | Status: DC

## 2020-03-21 MED ORDER — FAMOTIDINE 20 MG PO TABS: 20.0000 mg | ORAL_TABLET | Freq: Every day | ORAL | 1 refills | Status: DC

## 2020-03-21 MED ORDER — APIXABAN 5 MG PO TABS: 5.0000 mg | ORAL_TABLET | Freq: Two times a day (BID) | ORAL | 2 refills | Status: DC

## 2020-03-21 MED ORDER — "INSULIN SYRINGE 31G X 5/16"" 1 ML MISC": 60.0000 [IU] | Freq: Two times a day (BID) | 0 refills | Status: DC

## 2020-03-21 MED ORDER — CARVEDILOL 25 MG PO TABS: 25.0000 mg | ORAL_TABLET | Freq: Two times a day (BID) | ORAL | 1 refills | Status: DC

## 2020-03-21 MED ORDER — AMOXICILLIN-POT CLAVULANATE ER 1000-62.5 MG PO TB12: 2.0000 | ORAL_TABLET | ORAL | 0 refills | Status: AC | PRN

## 2020-03-21 NOTE — Discharge Summary (Signed)
Physician Discharge Summary  Darryl Kelley [id number] DOB: [birth year] DOA: 03/16/2020  PCP: Gifford Shave, MD  Admit date: 03/16/2020 Discharge date: 03/21/2020  Time spent: 55 minutes  Recommendations for Outpatient Follow-up:  1. Follow-up with Dr. Loanne Drilling, endocrinology in 2 weeks for further management of diabetes.  Patient's insulin was switched to Novolin 70/30 60 units twice daily as on presentation patient stated due to financial constraints was unable to obtain his home regimen insulin. 2. Follow-up with Gifford Shave, MD in 2 weeks.  On follow-up patient will need a basic metabolic profile done to follow-up on electrolytes and renal function.  Patient blood pressure need to be reassessed on follow-up and further titration made if needed. 3. Follow-up at regular outpatient hemodialysis center on Tuesday, 03/23/2020 4. Follow-up with Dr. Gasper Sells, cardiology in 2 to 3 weeks.   Discharge Diagnoses:  Principal Problem:   DKA (diabetic ketoacidosis) ([place]) Active Problems:   Hyperosmolar hyperglycemic state (HHS) ([place])   Type 2 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 (HCC)   End stage renal disease (HCC)   Hypertension   Hypertensive heart and renal disease   Mixed hyperlipidemia   Paroxysmal atrial fibrillation (HCC)   Acute metabolic encephalopathy   Hypertensive urgency   PNA (pneumonia)   CAD (coronary artery disease)   Community acquired pneumonia   Corrected TGA/IVS/LVOTO-transpos, intact vent sept, LV outflo obstruct   Discharge Condition: Stable and improved.  Diet recommendation: Carb modified diet  Filed Weights   03/19/20 1515 03/20/20 0100 03/21/20 0552  Weight: 106.6 kg 109.1 kg 104.6 kg    History of present illness:  HPI per Dr. Tamala Julian 55 yo M PMH ESRD on HD, DM2, Hepatitis, Afib, Diastolic HF who presented to ED 3/15 with AMS. Pt missed HD 3/14, and was found today with glu > 600. In ED, Glu >1000. Na is 127, K 4.4 Cr  12, WBC 12, LA 2.8 VBG 7.3/24/119/17/6/16  PCCM asked to evaluate for admission to ICU   Patient c/o chest pain, vague then does not answer next couple questions. He is agitated continuously trying to adjust blankets. Admits to cough with undefined sputum production. AO to self only.  Hospital Course:  1 DKA/hyperosmolar hyperglycemic state Patient presented to the ED with acute metabolic encephalopathy, noted to have a glucose level > 1000, elevated beta hydroxybutyrate level of 6.74, sodium of 127, bicarb of 16, anion gap of 30.  VBG with a pH of 7.4, PCO2 of 24, O2 of 119, PCO2 of 17, bicarb of 16.2. -Patient noted to have been out of insulin for about 2 weeks secondary to financial issues/insurance. -Chest x-ray concerning for infectious etiology of possible pneumonia. -Troponin noted to be elevated on presentation. -EKG with normal sinus rhythm with no ischemic changes noted. -Patient was subsequently transitioned off the insulin drip and was on subcutaneous insulin NPH 50 units twice daily and meal coverage NovoLog as well as sliding scale insulin..  -Hemoglobin A1c > 15.5 -Due to financial constraints TOC and diabetes coordinator followed the patient during the hospitalization.. -Patient transitioned from NPH to 70/30 60 units twice daily for glucose control which patient will be discharged home on.   -Patient also placed on Reglan and dose uptitrated to 10 mg before meals and at bedtime.   -Outpatient follow-up with endocrinology and PCP.    2.  New onset A. fib with RVR/paroxysmal A. Fib CHA2DS2VASC score 2 -Patient noted to be in A. fib with RVR early on during the hospitalization, on maximum doses IV  Cardizem drip, received IV metoprolol x3, placed on digoxin as well and has subsequently converted to normal sinus rhythm. -Patient did state in 2017 was admitted to the hospital in [place] with a similar presentation, at that time was placed on anticoagulation however since  discharge from that hospital was not placed on anticoagulation. -2D echo with EF of 70 to 75%, NWMA, moderate LVH.  Grade 1 diastolic dysfunction. -Cardiac enzymes elevated but flattened and decreased from admission.   -Patient converted to normal sinus rhythm during the hospitalization and was in normal sinus rhythm by day of discharge.  I  -Patient maintained on Coreg for rate control.   -Patient initially placed on IV heparin for anticoagulation. -Patient subsequently transitioned from IV heparin to oral Eliquis which he was tolerating.   -Patient seen in consultation by cardiology who are recommending need for anticoagulation going forward given a CHA2DS2-VASc score of at least 2 for hypertension and diabetes.   -Outpatient follow-up with cardiology.   3.  Hypertensive urgency Patient noted to have systolic blood pressures in the 200s. -Blood pressure improved regimen of Coreg 25 mg twice daily, Norvasc 10 mg daily, hydralazine 50 mg p.o. every 8 hours, Cozaar daily.   -Outpatient follow-up.   4.  Probable right lower lobe CAP, sepsis ruled out -Chest x-ray on admission concerning for ill-defined right lower lobe opacity consistent with pneumonia. -SLP evaluated patient and patient with no increased risk of aspiration at this time. -SLP recommended continued current diet. -Status post 5 days IV Rocephin and azithromycin.   -No further antibiotics needed on discharge.  5.  Coronary artery disease/hyperlipidemia/elevated troponin Patient with history of coronary artery disease nonspecific chest pain on admission.  Patient noted to have elevated troponin of 584. -Repeat troponins elevated but trended down and flat. -2D echo with EF of 70 to 75%, NWMA, moderate LVH.  Grade 1 diastolic dysfunction.  -Patient maintained on aspirin, Coreg, Cozaar, hydralazine. -Cardiology consulted and feel no further invasive work-up needed at this time.  6.  End-stage renal disease on hemodialysis  secondary to diabetes mellitus type 2/hypertension, Patient with history of end-stage renal disease on hemodialysis( [facility]) -Patient noted to have missed HD session 03/15/2020 and subsequently with AMS noted to be hyperglycemic with DKA/HHS. -Nephrology followed the patient during the hospitalization patient received hemodialysis during this hospitalization.   -Follow-up in regular outpatient hemodialysis unit on Tuesday, 03/23/2020.   7.  Poorly controlled diabetes mellitus type 2/gastroparesis Patient currently on NPH 50 units twice daily.  Hemoglobin A1c > 15.5. -Patient noted to have financial issues with obtaining his insulin. -Was patient's DKA had resolved he was transitioned to subcutaneous insulin..   -Patient transitioned to Novolin 70/30 60 units twice daily due to financial constraints.   -Patient still with nausea during the hospitalization and patient's Reglan increased to 10 mg before meals and at bedtime with clinical improvement. -Outpatient follow-up with PCP and endocrinology.  8.  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 -2D echo with EF of 70 to 75%, NWMA, moderate LVH.  Grade 1 diastolic dysfunction.  -Patient recommended to take Augmentin 2 g p.o. as needed dental procedures per cardiology. -Outpatient follow-up.  9.  Acute metabolic encephalopathy Likely secondary to problem #1 and pneumonia in the setting of missed HD session. -Improved and resolved by day of discharge.   10.  Hypokalemia Patient with end-stage renal disease on hemodialysis.  Patient seen by nephrology  underwent hemodialysis with management of hypokalemia.  Outpatient follow-up.     Procedures:  2D echo 03/18/2020  Chest x-ray 03/16/2020 >>>> right lower lobe opacity    Consultations:  PCCM admission  Cardiology: Dr. Margaretann Loveless 03/20/2020  Nephrology    Discharge Exam: Vitals:    03/21/20 1131 03/21/20 1433  BP: 101/72 128/81  Pulse: 72 75  Resp: 18   Temp: (!) 97.5 F (36.4 C)   SpO2: 98%     General: NAD Cardiovascular: RRR Respiratory: CTAB  Discharge Instructions   Discharge Instructions    Diet Carb Modified   Complete by: As directed    Increase activity slowly   Complete by: As directed      Allergies as of 03/21/2020      Reactions   Atorvastatin Hives   Rosuvastatin Rash      Medication List    STOP taking these medications   aspirin 81 MG EC tablet   Basaglar KwikPen 100 UNIT/ML   heparin 1000 unit/mL Soln injection   NovoLIN N FlexPen 100 UNIT/ML Kiwkpen Generic drug: Insulin NPH (Human) ([place])   NovoLOG FlexPen 100 UNIT/ML FlexPen Generic drug: insulin aspart   Pen Needles 31G X 8 MM Misc   rosuvastatin 10 MG tablet Commonly known as: Crestor   VITAMIN D PO     TAKE these medications   acetaminophen 650 MG CR tablet Commonly known as: TYLENOL Take 650 mg by mouth every 8 (eight) hours as needed for pain.   amLODipine 5 MG tablet Commonly known as: NORVASC Take 2 tablets (10 mg total) by mouth daily.   amoxicillin-clavulanate 1000-62.5 MG 12 hr tablet Commonly known as: Augmentin XR Take 2 tablets by mouth as needed for up to 3 days. Augmentin 2g prn dental procedure.   apixaban 5 MG Tabs tablet Commonly known as: ELIQUIS Take 1 tablet (5 mg total) by mouth 2 (two) times daily.   carvedilol 25 MG tablet Commonly known as: COREG Take 1 tablet (25 mg total) by mouth 2 (two) times daily with a meal. What changed:   medication strength  how much to take   ezetimibe 10 MG tablet Commonly known as: ZETIA Take 10 mg by mouth daily.   famotidine 20 MG tablet Commonly known as: PEPCID Take 1 tablet (20 mg total) by mouth daily. Start taking on: March 22, 2020   fluticasone 50 MCG/ACT nasal spray Commonly known as: FLONASE Place 2 sprays into both nostrils daily.   FreeStyle Libre 14 Day Sensor  Misc 1 each by Does not apply route every 14 (fourteen) days. For use with continuous glucose monitoring system. Change every 14 days; E11.9   hydrALAZINE 50 MG tablet Commonly known as: APRESOLINE Take 1 tablet (50 mg total) by mouth every 8 (eight) hours.   icosapent Ethyl 1 g capsule Commonly known as: Vascepa Take 2 capsules (2 g total) by mouth 2 (two) times daily.   insulin aspart protamine- aspart (70-30) 100 UNIT/ML injection Commonly known as: NOVOLOG MIX 70/30 Inject 0.6 mLs (60 Units total) into the skin 2 (two) times daily with a meal.   INSULIN SYRINGE 1CC/31GX5/16" 31G X 5/16" 1 ML Misc 60 Units by Does not apply route 2 (two) times daily.   LOPERAMIDE HCL PO Take 2 mg by mouth daily as needed (loose stools).   losartan 50 MG tablet Commonly known as: COZAAR Take 50 mg by mouth at bedtime.   metoCLOPramide 10 MG tablet Commonly known as: REGLAN Take 1 tablet (10 mg  total) by mouth 4 (four) times daily -  before meals and at bedtime. What changed:   medication strength  See the new instructions.   OneTouch Verio test strip Generic drug: glucose blood 1 each by Other route in the morning and at bedtime. And lancets 2/day   pregabalin 150 MG capsule Commonly known as: LYRICA Take 1 capsule (150 mg total) by mouth 2 (two) times daily.   Renal 1 MG Caps Take 1 capsule by mouth daily.   sevelamer carbonate 800 MG tablet Commonly known as: RENVELA TAKE 2 TABLETS BY MOUTH 3 TIMES A DAY AND ONE WITH SNACK What changed: See the new instructions.      Allergies  Allergen Reactions  . Atorvastatin Hives  . Rosuvastatin Rash    Follow-up Information    Gifford Shave, MD. Schedule an appointment as soon as possible for a visit in 2 week(s).   Specialty: Family Medicine Contact information: [id number] [place] [address] [phone]        HD Follow up on 03/23/2020.   Why: F/u on regul;ar dialysis day on Tuesday       Rudean Haskell A, MD. Schedule an appointment as soon as possible for a visit in 3 week(s).   Specialty: Cardiology Why: f/u in 2-3 weeks. Contact information: [address] [place] [address] [phone]        Renato Shin, MD. Schedule an appointment as soon as possible for a visit in 2 week(s).   Specialty: Endocrinology Contact information: [address] [place] [address] [phone]                The results of significant diagnostics from this hospitalization (including imaging, microbiology, ancillary and laboratory) are listed below for reference.    Significant Diagnostic Studies: DG Chest Port 1 View  Result Date: 03/16/2020 CLINICAL DATA:  Shortness of breath EXAM: PORTABLE CHEST 1 VIEW COMPARISON:  None FINDINGS: There is subtle ill-defined opacity in the right lower lobe region. The lungs elsewhere are clear. Heart is mildly enlarged with pulmonary vascularity normal. No adenopathy. No bone lesions. IMPRESSION: Ill-defined airspace opacity right lower lung region concerning for focus of pneumonia. Lungs elsewhere clear. Heart is slightly enlarged. No demonstrable adenopathy. Electronically Signed   By: Lowella Grip III M.D.   On: 03/16/2020 14:14   ECHOCARDIOGRAM COMPLETE  Result Date: 03/18/2020    ECHOCARDIOGRAM REPORT   Patient Name:   Darryl Kelley Date of Exam: 03/18/2020 Medical Rec #:  [id number]          Height:       69.0 in Accession #:    [id number]         Weight:       232.4 lb Date of Birth:  Feb 13, 1965           BSA:          2.202 m Patient Age:    55 years           BP:           163/68 mmHg Patient Gender: M                  HR:           73 bpm. Exam Location:  Inpatient Procedure: 2D Echo, Cardiac Doppler, Color Doppler and Intracardiac            Opacification Agent Indications:    I42.9 Cardiomyopathy (unspecified)  History:        Patient has no prior history of Echocardiogram examinations.                  Risk Factors:Hypertension and Diabetes. Kidney failure.  Sonographer:    Merrie Roof RDCS Referring Phys: [id number] [place]  1. There is an LVOT gradient of 53 mm Hg which increaed to 58 mmHg with Valsalva.     . Left ventricular ejection fraction, by estimation, is 70 to 75%. The left ventricle has hyperdynamic function. The left ventricle has no regional wall motion abnormalities. There is moderate left ventricular hypertrophy. Left ventricular diastolic parameters are consistent with Grade I diastolic dysfunction (impaired relaxation).  2. Right ventricular systolic function is normal. The right ventricular size is normal.  3. The mitral valve is normal in structure. No evidence of mitral valve regurgitation. No evidence of mitral stenosis.  4. The aortic valve is normal in structure. Aortic valve regurgitation is not visualized. No aortic stenosis is present. FINDINGS  Left Ventricle: There is an LVOT gradient of 53 mm Hg which increaed to 58 mmHg with Valsalva. Left ventricular ejection fraction, by estimation, is 70 to 75%. The left ventricle has hyperdynamic function. The left ventricle has no regional wall motion abnormalities. Definity contrast agent was given IV to delineate the left ventricular endocardial borders. The left ventricular internal cavity size was small. There is moderate left ventricular hypertrophy. Left ventricular diastolic parameters are consistent with Grade I diastolic dysfunction (impaired relaxation). Right Ventricle: The right ventricular size is normal. No increase in right ventricular wall thickness. Right ventricular systolic function is normal. Left Atrium: Left atrial size was normal in size. Right Atrium: Right atrial size was normal in size. Pericardium: There is no evidence of pericardial effusion. Mitral Valve: The mitral valve is normal in structure. No evidence of mitral valve regurgitation. No evidence of mitral valve stenosis. Tricuspid Valve: The  tricuspid valve is grossly normal. Tricuspid valve regurgitation is not demonstrated. Aortic Valve: The aortic valve is normal in structure. Aortic valve regurgitation is not visualized. No aortic stenosis is present. Aortic valve mean gradient measures 10.0 mmHg. Aortic valve peak gradient measures 16.3 mmHg. Pulmonic Valve: The pulmonic valve was normal in structure. Pulmonic valve regurgitation is not visualized. Aorta: The aortic root and ascending aorta are structurally normal, with no evidence of dilitation. IAS/Shunts: The atrial septum is grossly normal.  LEFT VENTRICLE PLAX 2D LVIDd:         3.80 cm  Diastology LVIDs:         2.60 cm  LV e' medial:    6.42 cm/s LV PW:         1.40 cm  LV E/e' medial:  11.1 LV IVS:        1.50 cm  LV e' lateral:   6.64 cm/s LVOT diam:     2.10 cm  LV E/e' lateral: 10.8 LVOT Area:     3.46 cm  LEFT ATRIUM             Index LA diam:        3.90 cm 1.77 cm/m LA Vol (A2C):   96.9 ml 44.01 ml/m LA Vol (A4C):   88.4 ml 40.15 ml/m LA Biplane Vol: 95.0 ml 43.14 ml/m  AORTIC VALVE AV Vmax:      202.00 cm/s AV Vmean:     148.000 cm/s AV VTI:       0.370 m AV Peak Grad: 16.3 mmHg AV  Mean Grad: 10.0 mmHg  AORTA Ao Root diam: 2.90 cm Ao Asc diam:  2.70 cm MITRAL VALVE MV Area (PHT): 1.72 cm     SHUNTS MV Decel Time: 440 msec     Systemic Diam: 2.10 cm MV E velocity: 71.40 cm/s MV A velocity: 131.00 cm/s MV E/A ratio:  0.55 Mertie Moores MD Electronically signed by Mertie Moores MD Signature Date/Time: 03/18/2020/11:04:29 AM    Final     Microbiology: Recent Results (from the past 240 hour(s))  Culture, blood (Routine X 2) w Reflex to ID Panel     Status: None (Preliminary result)   Collection Time: 03/16/20  1:35 PM   Specimen: BLOOD  Result Value Ref Range Status   Specimen Description BLOOD RIGHT ANTECUBITAL  Final   Special Requests   Final    BOTTLES DRAWN AEROBIC AND ANAEROBIC Blood Culture results may not be optimal due to an inadequate volume of blood received in  culture bottles   Culture   Final    NO GROWTH 4 DAYS Performed at [facility], [place] [address] [place], [place] [address]    Report Status PENDING  Incomplete  Culture, blood (Routine X 2) w Reflex to ID Panel     Status: None (Preliminary result)   Collection Time: 03/16/20  1:40 PM   Specimen: BLOOD  Result Value Ref Range Status   Specimen Description BLOOD RIGHT ANTECUBITAL  Final   Special Requests   Final    BOTTLES DRAWN AEROBIC AND ANAEROBIC Blood Culture results may not be optimal due to an inadequate volume of blood received in culture bottles   Culture   Final    NO GROWTH 4 DAYS Performed at [facility], [place] [address] [place], [place] [address]    Report Status PENDING  Incomplete  Resp Panel by RT-PCR (Flu A&B, Covid) Nasopharyngeal Swab     Status: None   Collection Time: 03/16/20  2:34 PM   Specimen: Nasopharyngeal Swab; Nasopharyngeal(NP) swabs in vial transport medium  Result Value Ref Range Status   SARS Coronavirus 2 by RT PCR NEGATIVE NEGATIVE Final    Comment: (NOTE) SARS-CoV-2 target nucleic acids are NOT DETECTED.  The SARS-CoV-2 RNA is generally detectable in upper respiratory specimens during the acute phase of infection. The lowest concentration of SARS-CoV-2 viral copies this assay can detect is 138 copies/mL. A negative result does not preclude SARS-Cov-2 infection and should not be used as the sole basis for treatment or other patient management decisions. A negative result may occur with  improper specimen collection/handling, submission of specimen other than nasopharyngeal swab, presence of viral mutation(s) within the areas targeted by this assay, and inadequate number of viral copies(<138 copies/mL). A negative result must be combined with clinical observations, patient history, and epidemiological information. The expected result is Negative.  Fact Sheet for Patients:  EntrepreneurPulse.com.au  Fact Sheet  for Healthcare Providers:  IncredibleEmployment.be  This test is no t yet approved or cleared by the [place] FDA and  has been authorized for detection and/or diagnosis of SARS-CoV-2 by FDA under an Emergency Use Authorization (EUA). This EUA will remain  in effect (meaning this test can be used) for the duration of the COVID-19 declaration under Section 564(b)(1) of the Act, 21 U.S.C.section 360bbb-3(b)(1), unless the authorization is terminated  or revoked sooner.       Influenza A by PCR NEGATIVE NEGATIVE Final   Influenza B by PCR NEGATIVE NEGATIVE Final    Comment: (  NOTE) The Xpert Xpress SARS-CoV-2/FLU/RSV plus assay is intended as an aid in the diagnosis of influenza from Nasopharyngeal swab specimens and should not be used as a sole basis for treatment. Nasal washings and aspirates are unacceptable for Xpert Xpress SARS-CoV-2/FLU/RSV testing.  Fact Sheet for Patients: EntrepreneurPulse.com.au  Fact Sheet for Healthcare Providers: IncredibleEmployment.be  This test is not yet approved or cleared by the [place] FDA and has been authorized for detection and/or diagnosis of SARS-CoV-2 by FDA under an Emergency Use Authorization (EUA). This EUA will remain in effect (meaning this test can be used) for the duration of the COVID-19 declaration under Section 564(b)(1) of the Act, 21 U.S.C. section 360bbb-3(b)(1), unless the authorization is terminated or revoked.  Performed at [facility], [place] [address], [place], [place] [address]   MRSA PCR Screening     Status: None   Collection Time: 03/16/20  6:23 PM   Specimen: Nasal Mucosa; Nasopharyngeal  Result Value Ref Range Status   MRSA by PCR NEGATIVE NEGATIVE Final    Comment:        The GeneXpert MRSA Assay (FDA approved for NASAL specimens only), is one component of a comprehensive MRSA colonization surveillance program. It is not intended to  diagnose MRSA infection nor to guide or monitor treatment for MRSA infections. Performed at [facility], [place] [address] [place], [place] [address]      Labs: Basic Metabolic Panel: Recent Labs  Lab 03/17/20 0836 03/17/20 1601 03/18/20 0509 03/19/20 0054 03/20/20 0249  NA 138 137 138 134* 137  K 3.0* 2.9* 3.6 3.2* 3.6  CL 98 97* 98 98 98  CO2 '28 26 27 25 27  '$ GLUCOSE 211* 223* 199* 171* 72  BUN 39* 37* 43* 50* 29*  CREATININE 7.35* 7.94* 9.03* 10.06* 7.70*  CALCIUM 9.0 8.9 8.6* 8.0* 8.5*  MG  --   --   --  2.3  --   PHOS  --  2.8  --   --  3.8   Liver Function Tests: Recent Labs  Lab 03/16/20 1334 03/17/20 1601 03/20/20 0249  AST 29  --   --   ALT 27  --   --   ALKPHOS 85  --   --   BILITOT 2.7*  --   --   PROT 7.0  --   --   ALBUMIN 3.3* 3.1* 2.6*   No results for input(s): LIPASE, AMYLASE in the last 168 hours. Recent Labs  Lab 03/16/20 1410  AMMONIA 30   CBC: Recent Labs  Lab 03/16/20 1334 03/16/20 1423 03/17/20 0453 03/17/20 1601 03/18/20 0509 03/19/20 0054 03/20/20 0249  WBC 12.6*  --  8.9 11.1* 8.9 5.0 5.5  NEUTROABS 10.1*  --   --   --   --   --   --   HGB 11.7*   < > 10.8* 12.8* 12.0* 11.2* 11.2*  HCT 33.7*   < > 29.0* 35.8* 33.3* 30.9* 30.6*  MCV 86.2  --  80.8 81.9 83.7 83.1 82.9  PLT 171  --  136* 159 108* 83* 87*   < > = values in this interval not displayed.   Cardiac Enzymes: No results for input(s): CKTOTAL, CKMB, CKMBINDEX, TROPONINI in the last 168 hours. BNP: BNP (last 3 results) No results for input(s): BNP in the last 8760 hours.  ProBNP (last 3 results) No results for input(s): PROBNP in the last 8760 hours.  CBG: Recent Labs  Lab 03/20/20 1149 03/20/20 1636  03/21/20 0003 03/21/20 0546 03/21/20 1131  GLUCAP 182* 130* 79 206* 194*       Signed:  Irine Seal MD.  [facility] 03/21/2020, 3:00 PM

## 2020-03-21 NOTE — Discharge Instructions (Signed)
Diabetic Ketoacidosis Diabetic ketoacidosis (DKA) is a serious complication of diabetes. This condition develops when there is not enough insulin in the body. Insulin is a hormone that regulates blood sugar (glucose) levels in the body. Normally, insulin allows glucose to enter the cells in the body. The cells break down glucose for energy. Without enough insulin, the body cannot break down glucose and breaks down fats instead. This leads to high blood glucose levels in the body. It also leads to the production of acids that are called ketones. Ketones are poisonous at high levels. If diabetic ketoacidosis is not treated, it can cause severe dehydration and can lead to a coma or death. What are the causes? This condition develops when a lack of insulin causes the body to break down fats instead of glucose. This may be triggered by:  Stress on the body. This stress can be brought on by an illness.  Infection.  Medicines that raise blood glucose levels.  Not taking or skipping doses of diabetes medicines.  New onset of type 1 diabetes mellitus.  Missing insulin on purpose or by accident.  Interruption of insulin through an insulin pump. This can happen if the cannula that connects you to the insulin pump gets dislodged or kinked. What are the signs or symptoms? Symptoms of this condition include:  Excessive thirst or dry mouth.  Excessive urination.  Abdominal pain.  Nausea or vomiting.  Vision changes.  Fruity or sweet-smelling breath.  Weight loss.  Irritability or confusion.  Rapid breathing.  High blood glucose.  High levels of ketones in the body. To know your ketone levels: ? Collect urine in a small cup. ? Dip a test strip in the urine. ? Wait for it to change color. ? Compare the test strip results to the chart on the container. How is this diagnosed? This condition is diagnosed based on your medical history, a physical exam, and blood tests. You may also  have a urine test to check for ketones. How is this treated? This condition may be treated with:  Fluid replacement. This may be done with IV fluids to correct dehydration.  Correcting high blood glucose with insulin. This may be given through the skin as injections or through an IV.  Electrolyte replacement. Electrolytes are minerals in your blood. Electrolytes such as potassium and sodium may be given in pill form or through an IV.  Antibiotic medicines. These may be prescribed if your condition was caused by an infection. Diabetic ketoacidosis is a serious medical condition. You may need emergency treatment in the hospital so that you can be monitored closely. Follow these instructions at home: Medicines  Take over-the-counter and prescription medicines only as told by your health care provider.  Continue to take insulin and other diabetes medicines as told by your health care provider.  If you were prescribed an antibiotic medicine, take it as told by your health care provider. Do not stop taking the antibiotic even if you start to feel better. Eating and drinking  Drink enough fluids to keep your urine pale yellow.  If you are able to eat, follow your usual diet and drink sugar-free liquids such as water, tea, and sugar-free soft drinks. You can also have sugar-free gelatin or ice pops.  If you are not able to eat, drink liquids that contain sugar in small amounts as you are able. Liquids include fruit juice, regular soft drinks, and sherbet.   Checking ketones and blood glucose  Check your urine for  ketones when you are ill and as told by your health care provider. ? If your blood glucose is 240 mg/dL (13.3 mmol/L) or higher, check your urine ketones every 4 hours. If you have moderate or large ketones, call your health care provider.  Check your blood glucose every day, and as often as told by your health care provider. ? If your blood glucose is high, drink plenty of fluids.  This helps to flush out ketones. ? If your blood glucose is above your target for 2 tests in a row, contact your health care provider.   General instructions  Carry a medical alert card or wear medical alert jewelry that shows that you have diabetes.  Exercise only as told by your health care provider. Do not exercise when your blood glucose is high and you have ketones in your urine.  If you get sick, call your health care provider and begin treatment quickly. Your body often needs extra insulin to fight an illness. Check your blood glucose every 4 hours when you are sick.  Keep all follow-up visits as told by your health care provider. This is important. Where to find more information  American Diabetes Association: diabetes.org Contact a health care provider if:  Your blood glucose level is higher than 240 mg/dL (13.3 mmol/L) for 2 days in a row.  You have moderate or large ketones in your urine.  You have a fever.  You cannot eat or drink without vomiting.  You have been vomiting for more than 2 hours.  You continue to have symptoms of diabetic ketoacidosis.  You develop new symptoms. Get help right away if:  Your blood glucose monitor reads high even when you are taking insulin.  You faint.  You have chest pain.  You have trouble breathing.  You have sudden trouble speaking or swallowing.  You have vomiting or diarrhea that gets worse after 3 hours.  You are unable to stay awake.  You have trouble thinking.  You are severely dehydrated. Symptoms of severe dehydration include: ? Extreme thirst. ? Dry mouth. ? Rapid breathing. These symptoms may represent a serious problem that is an emergency. Do not wait to see if the symptoms will go away. Get medical help right away. Call your local emergency services (911 in the [place] Do not drive yourself to the hospital. Summary  Diabetic ketoacidosis is a serious complication of diabetes. This condition develops when  there is not enough insulin in the body.  This condition is diagnosed based on your medical history, a physical exam, and blood tests. You may also have a urine test to check for ketones.  Diabetic ketoacidosis is a serious medical condition. You may need emergency treatment in the hospital to monitor your condition.  Contact your health care provider if your blood glucose is higher than 240 mg/dL for 2 days in a row or if you have moderate or large ketones in your urine. This information is not intended to replace advice given to you by your health care provider. Make sure you discuss any questions you have with your health care provider. Document Revised: 11/13/2018 Document Reviewed: 11/13/2018 Elsevier Patient Education  [place] Pneumonia, Adult Pneumonia is a lung infection that causes inflammation and the buildup of mucus and fluids in the lungs. This may cause coughing and difficulty breathing. Community-acquired pneumonia is pneumonia that develops in people who are not, and have not recently been, in a hospital or other health care facility.  Usually, pneumonia develops as a result of an illness that is caused by a virus, such as the common cold and the flu (influenza). It can also be caused by bacteria or fungi. While the common cold and influenza can pass from person to person (are contagious), pneumonia itself is not considered contagious. What are the causes? This condition may be caused by:  Viruses.  Bacteria.  Fungi, such as molds or mushrooms.   What increases the risk? The following factors may make you more likely to develop this condition:  Having certain medical conditions, such as: ? A long-term (chronic) disease, which may include chronic obstructive pulmonary disease (COPD), asthma, heart failure, cystic fibrosis, diabetes, kidney disease, sickle cell disease, and human immunodeficiency virus (HIV). ? A condition that increases the  risk of breathing in (aspirating) mucus and other fluids from your mouth and nose. ? A weakened body defense system (immune system).  Having had your spleen removed (splenectomy). The spleen is the organ that helps fight germs and infections.  Not cleaning your teeth and gums well (poor dental hygiene).  Using tobacco products.  Traveling to places where germs that cause pneumonia are present.  Being near certain animals, or animal habitats, that have germs that cause pneumonia.  Being older than 56 years of age. What are the signs or symptoms? Symptoms of this condition include:  A dry cough or a wet (productive) cough.  A fever.  Sweating or chills.  Chest pain, especially when breathing deeply or coughing.  Fast breathing, difficulty breathing, or shortness of breath.  Tiredness (fatigue).  Muscle aches. How is this diagnosed? This condition may be diagnosed based on your medical history or a physical exam. You may also have tests, including:  Chest X-rays.  Tests of the level of oxygen and other gases in your blood.  Tests of: ? Your blood. ? Mucus from your lungs (sputum). ? Fluid around your lungs (pleural fluid). ? Your urine. If your pneumonia is severe, other tests may be done to learn more about the cause.   How is this treated? Treatment for this condition depends on many factors, such as the cause of your pneumonia, your medicines, and other medical conditions that you have. For most adults, pneumonia may be treated at home. In some cases, treatment must happen in a hospital and may include:  Medicines that are given by mouth (orally) or through an IV, including: ? Antibiotic medicines, if bacteria caused the pneumonia. ? Medicines that kill viruses (antiviral medicines), if a virus caused the pneumonia.  Oxygen therapy. Severe pneumonia, although rare, may require the following treatments:  Mechanical ventilation.This procedure uses a machine to help  you breathe if you cannot breathe well on your own or maintain a safe level of blood oxygen.  Thoracentesis. This procedure removes any buildup of pleural fluid to help with breathing. Follow these instructions at home: Medicines  Take over-the-counter and prescription medicines only as told by your health care provider.  Take cough medicine only if you have trouble sleeping. Cough medicine can prevent your body from removing mucus from your lungs.  If you were prescribed an antibiotic medicine, take it as told by your health care provider. Do not stop taking the antibiotic even if you start to feel better. Lifestyle  Do not drink alcohol.  Do not use any products that contain nicotine or tobacco, such as cigarettes, e-cigarettes, and chewing tobacco. If you need help quitting, ask your health care provider.  Eat a  healthy diet. This includes plenty of vegetables, fruits, whole grains, low-fat dairy products, and lean protein.      General instructions  Rest a lot and get at least 8 hours of sleep each night.  Sleep in a partly upright position at night. Place a few pillows under your head or sleep in a reclining chair.  Return to your normal activities as told by your health care provider. Ask your health care provider what activities are safe for you.  Drink enough fluid to keep your urine pale yellow. This helps to thin the mucus in your lungs.  If your throat is sore, gargle with a salt-water mixture 3-4 times a day or as needed. To make a salt-water mixture, completely dissolve -1 tsp (3-6 g) of salt in 1 cup (237 mL) of warm water.  Keep all follow-up visits as told by your health care provider. This is important.   How is this prevented? You can lower your risk of developing community-acquired pneumonia by:  Getting the pneumonia vaccine. There are different types and schedules of pneumonia vaccines. Ask your health care provider which option is best for you. Consider  getting the pneumonia vaccine if: ? You are older than 55 years of age. ? You are 53-54 years of age and are receiving cancer treatment, have chronic lung disease, or have other medical conditions that affect your immune system. Ask your health care provider if this applies to you.  Getting your influenza vaccine every year. Ask your health care provider which type of vaccine is best for you.  Getting regular dental checkups.  Washing your hands often with soap and water for at least 20 seconds. If soap and water are not available, use hand sanitizer. Contact a health care provider if you have:  A fever.  Trouble sleeping because you cannot control your cough with cough medicine. Get help right away if:  Your shortness of breath becomes worse.  Your chest pain increases.  Your sickness becomes worse, especially if you are an older adult or have a weak immune system.  You cough up blood. These symptoms may represent a serious problem that is an emergency. Do not wait to see if the symptoms will go away. Get medical help right away. Call your local emergency services (911 in the [place] Do not drive yourself to the hospital. Summary  Pneumonia is an infection of the lungs.  Community-acquired pneumonia develops in people who have not been in the hospital. It can be caused by bacteria, viruses, or fungi.  This condition may be treated with antibiotics or antiviral medicines.  Severe pneumonia may require a hospital stay and treatment to help with breathing. This information is not intended to replace advice given to you by your health care provider. Make sure you discuss any questions you have with your health care provider. Document Revised: 10/01/2018 Document Reviewed: 10/01/2018 Elsevier Patient Education  [place] Pneumonia, Adult Pneumonia is an infection of the lungs. It causes irritation and swelling in the airways of the lungs. Mucus and  fluid may also build up inside the airways. This may cause coughing and trouble breathing. One type of pneumonia can happen while you are in a hospital. A different type can happen when you are not in a hospital (community-acquired pneumonia). What are the causes? This condition is caused by germs (viruses, bacteria, or fungi). Some types of germs can spread from person to person. Pneumonia is not thought to spread from  person to person.   What increases the risk? You are more likely to develop this condition if:  You have a long-term (chronic) disease, such as: ? Disease of the lungs. This may be chronic obstructive pulmonary disease (COPD) or asthma. ? Heart failure. ? Cystic fibrosis. ? Diabetes. ? Kidney disease. ? Sickle cell disease. ? HIV.  You have other health problems, such as: ? Your body's defense system (immune system) is weak. ? A condition that may cause you to breathe in fluids from your mouth and nose.  You had your spleen taken out.  You do not take good care of your teeth and mouth (poor dental hygiene).  You use or have used tobacco products.  You travel where the germs that cause this illness are common.  You are near certain animals or the places they live.  You are older than 55 years of age. What are the signs or symptoms? Symptoms of this condition include:  A cough.  A fever.  Sweating or chills.  Chest pain, often when you breathe deeply or cough.  Breathing problems, such as: ? Fast breathing. ? Trouble breathing. ? Shortness of breath.  Feeling tired (fatigued).  Muscle aches. How is this treated? Treatment for this condition depends on many things, such as:  The cause of your illness.  Your medicines.  Your other health problems. Most adults can be treated at home. Sometimes, treatment must happen in a hospital.  Treatment may include medicines to kill germs.  Medicines may depend on which germ caused your illness. Very bad  pneumonia is rare. If you get it, you may:  Have a machine to help you breathe.  Have fluid taken away from around your lungs. Follow these instructions at home: Medicines  Take over-the-counter and prescription medicines only as told by your doctor.  Take cough medicine only if you are losing sleep. Cough medicine can keep your body from taking mucus away from your lungs.  If you were prescribed an antibiotic medicine, take it as told by your doctor. Do not stop taking the antibiotic even if you start to feel better. Lifestyle  Do not drink alcohol.  Do not use any products that contain nicotine or tobacco, such as cigarettes, e-cigarettes, and chewing tobacco. If you need help quitting, ask your doctor.  Eat a healthy diet. This includes a lot of vegetables, fruits, whole grains, low-fat dairy products, and low-fat (lean) protein.      General instructions  Rest a lot. Sleep for at least 8 hours each night.  Sleep with your head and neck raised. Put a few pillows under your head or sleep in a reclining chair.  Return to your normal activities as told by your doctor. Ask your doctor what activities are safe for you.  Drink enough fluid to keep your pee (urine) pale yellow.  If your throat is sore, rinse your mouth often with salt water. To make salt water, dissolve -1 tsp (3-6 g) of salt in 1 cup (237 mL) of warm water.  Keep all follow-up visits as told by your doctor. This is important.   How is this prevented? You can lower your risk of pneumonia by:  Getting the pneumonia shot (vaccine). These shots have different types and schedules. Ask your doctor what works best for you. Think about getting this shot if: ? You are older than 55 years of age. ? You are 71-69 years of age and:  You are being treated for cancer.  You have long-term lung disease.  You have other problems that affect your body's defense system. Ask your doctor if you have one of these.  Getting  your flu shot every year. Ask your doctor which type of shot is best for you.  Going to the dentist as often as told.  Washing your hands often with soap and water for at least 20 seconds. If you cannot use soap and water, use hand sanitizer. Contact a doctor if:  You have a fever.  You lose sleep because your cough medicine does not help. Get help right away if:  You are short of breath and this gets worse.  You have more chest pain.  Your sickness gets worse. This is very serious if: ? You are an older adult. ? Your body's defense system is weak.  You cough up blood. These symptoms may be an emergency. Do not wait to see if the symptoms will go away. Get medical help right away. Call your local emergency services (911 in the [place] Do not drive yourself to the hospital. Summary  Pneumonia is an infection of the lungs.  Community-acquired pneumonia affects people who have not been in the hospital. Certain germs can cause this infection.  This condition may be treated with medicines that kill germs.  For very bad pneumonia, you may need a hospital stay and treatment to help with breathing. This information is not intended to replace advice given to you by your health care provider. Make sure you discuss any questions you have with your health care provider. Document Revised: 10/01/2018 Document Reviewed: 10/01/2018 Elsevier Patient Education  [place] on my medicine - ELIQUIS (apixaban)   Why was Eliquis prescribed for you? Eliquis was prescribed for you to reduce the risk of a blood clot forming that can cause a stroke if you have a medical condition called atrial fibrillation (a type of irregular heartbeat).  What do You need to know about Eliquis ? Take your Eliquis TWICE DAILY - one tablet in the morning and one tablet in the evening with or without food. If you have difficulty swallowing the tablet whole please discuss with your pharmacist how  to take the medication safely.  Take Eliquis exactly as prescribed by your doctor and DO NOT stop taking Eliquis without talking to the doctor who prescribed the medication.  Stopping may increase your risk of developing a stroke.  Refill your prescription before you run out.  After discharge, you should have regular check-up appointments with your healthcare provider that is prescribing your Eliquis.  In the future your dose may need to be changed if your kidney function or weight changes by a significant amount or as you get older.  What do you do if you miss a dose? If you miss a dose, take it as soon as you remember on the same day and resume taking twice daily.  Do not take more than one dose of ELIQUIS at the same time to make up a missed dose.  Important Safety Information A possible side effect of Eliquis is bleeding. You should call your healthcare provider right away if you experience any of the following: ? Bleeding from an injury or your nose that does not stop. ? Unusual colored urine (red or dark brown) or unusual colored stools (red or black). ? Unusual bruising for unknown reasons. ? A serious fall or if you hit your head (even if there is no bleeding).  Some medicines may interact with  Eliquis and might increase your risk of bleeding or clotting while on Eliquis. To help avoid this, consult your healthcare provider or pharmacist prior to using any new prescription or non-prescription medications, including herbals, vitamins, non-steroidal anti-inflammatory drugs (NSAIDs) and supplements.  This website has more information on Eliquis (apixaban): http://www.eliquis.com/eliquis/home

## 2020-03-21 NOTE — Care Management (Signed)
Provided with Eliquis card and taxi voucher.

## 2020-03-21 NOTE — Progress Notes (Signed)
IV and telemetry monitor removed. Patient provided with discharge education and materials, verbalized understanding of all information provided. Patient discharged with all belongings found at bedside. Patient states he is missing keys for his apartment that had a [employer] key chain. Patient states he was told that EMS left his keys with the ER when he was brought in. ED staff, security, charge nurse made aware of missing keys and situation. Patient has spare key to apartment in his wallet and states he could use that until his original keys are returned. Staff discussed with patient that Isabell Jarvis will be returned to him in the case they are located.  No other issues voiced at this time and patient discharged via [employer].

## 2020-03-21 NOTE — Progress Notes (Signed)
[facility] Progress Note   Assessment/ Plan:   Dialysis Orders:[facility] T,Th,S 4 hrs 15 min 200NRe 500/500 2.0K/2.0 Ca UFP 4 AVG -Heparin 4000 units IV TIW -Calcitriol 1.5 mcg PO TIW  Assessment/Plan:  1. DKA-BS 1186 AG >30. Resolved.  Getting 70/30 insulin for cost effectiveness    2. RRL PNA: blood cultures negative, on PO azithro and IV ceftriaxone 3. New onset Afib: getting Eliquis as AC on coreg as well.  4. AMS: Resolved 5. ESRD - T,Th, S via AVG.    S/p HD 3/18 with resumption of regular schedule 3/19.  Next HD planned for Tuesday 6. Hypertension/volume - Very hypertensive on admission. No evidence of volume excess by exam. On amlodipine 10 mg po q HS and Losartan 50 mg PO at home.  Have been restarted.  Will need new EDW at d/c likely  7. Anemia - HGB at goal. Last Iron panel at HD unit 03/11/20 replete. Follow HGB. 8. Metabolic bone disease - NPO at present. Resume VDRA, binders when able to eat. 9. Nutrition - diabetic diet 10. H/OHx endocarditis with IVDA 11. H/O Hep C. 12. H/O gastroparesis. Was referred by Dr. Joelyn Oms to GI 03/11/20. No notes in Epic that he has been to GI yet. 13. Dispo: OK from renal perspective to go today   Subjective:    S/p HD yesterday, no issues.  Feeling well today   Objective:   BP (!) 148/84   Pulse 70   Temp 97.9 F (36.6 C) (Oral)   Resp 18   Wt 104.6 kg   SpO2 100%   BMI 34.04 kg/m   Physical Exam: Gen: NAD, sitting in bed CVS: irregular Resp: clear Abd: soft Ext: L AVG + T/B  Labs: BMET Recent Labs  Lab 03/17/20 0017 03/17/20 0453 03/17/20 0836 03/17/20 1601 03/18/20 0509 03/19/20 0054 03/20/20 0249  NA 130* 135 138 137 138 134* 137  K 3.5 3.0* 3.0* 2.9* 3.6 3.2* 3.6  CL 88* 92* 98 97* 98 98 98  CO2 '23 26 28 26 27 25 27  '$ GLUCOSE 907* 615* 211* 223* 199* 171* 72  BUN 75* 76* 39* 37* 43* 50* 29*  CREATININE 12.09* 12.29* 7.35* 7.94* 9.03* 10.06* 7.70*  CALCIUM 8.7* 8.7* 9.0 8.9  8.6* 8.0* 8.5*  PHOS  --   --   --  2.8  --   --  3.8   CBC Recent Labs  Lab 03/16/20 1334 03/16/20 1423 03/17/20 1601 03/18/20 0509 03/19/20 0054 03/20/20 0249  WBC 12.6*   < > 11.1* 8.9 5.0 5.5  NEUTROABS 10.1*  --   --   --   --   --   HGB 11.7*   < > 12.8* 12.0* 11.2* 11.2*  HCT 33.7*   < > 35.8* 33.3* 30.9* 30.6*  MCV 86.2   < > 81.9 83.7 83.1 82.9  PLT 171   < > 159 108* 83* 87*   < > = values in this interval not displayed.      Medications:    . amLODipine  10 mg Oral Daily  . apixaban  5 mg Oral BID  . azithromycin  500 mg Oral Daily  . calcitRIOL  1.5 mcg Oral Q T,Th,Sat-1800  . carvedilol  25 mg Oral BID WC  . Chlorhexidine Gluconate Cloth  6 each Topical Q0600  . Chlorhexidine Gluconate Cloth  6 each Topical Q0600  . ezetimibe  10 mg Oral Daily  . famotidine  20 mg Oral Daily  .  hydrALAZINE  50 mg Oral Q8H  . icosapent Ethyl  2 g Oral BID  . insulin aspart  0-6 Units Subcutaneous TID WC  . insulin aspart protamine- aspart  60 Units Subcutaneous BID WC  . losartan  50 mg Oral Daily  . mouth rinse  15 mL Mouth Rinse BID  . metoCLOPramide  10 mg Oral TID AC & HS  . pregabalin  75 mg Oral Daily     Madelon Lips, MD 03/21/2020, 9:37 AM

## 2020-03-21 NOTE — Progress Notes (Incomplete)
PROGRESS NOTE    Darryl Kelley  [id number] DOB: [birth year] DOA: 03/16/2020 PCP: Gifford Shave, MD    Chief Complaint  Patient presents with  . Altered Mental Status  . Hyperglycemia    Brief Narrative:  Patient is a 55 year old gentleman history of end-stage renal disease on hemodialysis, who had missed hemodialysis session, presenting to the ED with altered mental status, noted to have blood glucose level > 1000, and noted to be in DKA.  Chest x-ray done concerning for right lower lobe opacity/pneumonia.  Patient placed on insulin drip, IV fluids, IV antibiotics, admitted to the ICU for DKA.  Patient noted to have elevated troponins.  Patient also noted to go into A. fib with RVR, placed on a Cardizem drip, received IV metoprolol x3, also placed on digoxin and subsequently converted back to sinus rhythm.  Patient also placed on anticoagulation with IV heparin.   Assessment & Plan:   Principal Problem:   DKA (diabetic ketoacidosis) ([place]) Active Problems:   Hyperosmolar hyperglycemic state (HHS) ([place])   Type 2 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 (HCC)   End stage renal disease (HCC)   Hypertension   Hypertensive heart and renal disease   Mixed hyperlipidemia   Paroxysmal atrial fibrillation (HCC)   Acute metabolic encephalopathy   Hypertensive urgency   PNA (pneumonia)   CAD (coronary artery disease)   Community acquired pneumonia   Corrected TGA/IVS/LVOTO-transpos, intact vent sept, LV outflo obstruct  1 DKA/hyperosmolar hyperglycemic state Patient presented to the ED with acute metabolic encephalopathy, noted to have a glucose level > 1000, elevated beta hydroxybutyrate level of 6.74, sodium of 127, bicarb of 16, anion gap of 30.  VBG with a pH of 7.4, PCO2 of 24, O2 of 119, PCO2 of 17, bicarb of 16.2. -Patient noted to have been out of insulin for about 2 weeks secondary to financial issues/insurance. -Chest x-ray concerning for infectious  etiology of possible pneumonia. -Troponin noted to be elevated on presentation. -EKG with normal sinus rhythm with no ischemic changes noted. -Patient has has subsequently been transitioned off the insulin drip and was on subcutaneous insulin NPH 50 units twice daily and meal coverage NovoLog as well as sliding scale insulin..  -Hemoglobin A1c > 15.5 -CBG 113 this morning. -Due to financial constraints TOC and diabetes coordinator following. -Patient transition from NPH to 70/3060 units twice daily for glucose control which patient will likely be discharged home on.  -Continue Reglan. -Supportive care.    2.  New onset A. fib with RVR/paroxysmal A. Fib CHA2DS2VASC score 2 -Patient noted to be in A. fib with RVR early on during the hospitalization, on maximum doses IV Cardizem drip, received IV metoprolol x3, placed on digoxin as well and has subsequently converted to normal sinus rhythm. -Patient did state in 2017 was admitted to the hospital in [place] with a similar presentation, at that time was placed on anticoagulation however since discharge from that hospital was not placed on anticoagulation. -2D echo with EF of 70 to 75%, NWMA, moderate LVH.  Grade 1 diastolic dysfunction. -Cardiac enzymes elevated but flattened and decreased from admission.   -In NSR.   -Continue Coreg for rate control.   -Patient has been transitioned from IV heparin to oral Eliquis which he is tolerating.   -Patient seen in consultation by cardiology who are recommending need for anticoagulation going forward given a CHA2DS2-VASc score of at least 2 for hypertension and diabetes.   -Appreciate cardiology input and recommendations.   3.  Hypertensive urgency Patient noted to have systolic blood pressures in the 200s. -Blood pressure improved on current regimen of Coreg 25 mg twice daily, Norvasc 10 mg daily, hydralazine 50 mg p.o. every 8 hours, Cozaar daily.   -Outpatient follow-up.   4.  Probable right  lower lobe CAP, sepsis ruled out -Chest x-ray on admission concerning for ill-defined right lower lobe opacity consistent with pneumonia. -SLP evaluated patient and patient with no increased risk of aspiration at this time. -SLP recommending continued current diet. -Continue empiric IV Rocephin and azithromycin. -Once nausea improves could likely transition to oral Augmentin to complete course of antibiotic treatment.    5.  Coronary artery disease/hyperlipidemia/elevated troponin Patient with history of coronary artery disease nonspecific chest pain on admission.  Patient noted to have elevated troponin of 584. -Repeat troponins elevated but trended down and flat. -2D echo with EF of 70 to 75%, NWMA, moderate LVH.  Grade 1 diastolic dysfunction.  -Continue aspirin, Coreg, Cozaar, hydralazine. -Cardiology consulted and following and feel no further invasive work-up needed at this time.  6.  End-stage renal disease on hemodialysis secondary to diabetes mellitus type 2/hypertension, Patient with history of end-stage renal disease on hemodialysis( [facility]THSat) -Patient noted to have missed HD session 03/15/2020 and subsequently with AMS noted to be hypoglycemic with DKA/HHS. -Nephrology following. -HD per nephrology.  7.  Poorly controlled diabetes mellitus type 2/gastroparesis Patient currently on NPH 50 units twice daily.  Hemoglobin A1c > 15.5. -Patient noted to have financial issues with obtaining his insulin. -CBG 113 this morning.   -Patient transition to Novolin 70/30 60 units twice daily due to financial constraints.   -Patient still with nausea.  Increase Reglan to 10 mg 3 times daily.   -IV antiemetics as needed.   8.  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 -2D echo with EF of 70 to 75%, NWMA, moderate LVH.  Grade 1 diastolic dysfunction.  -Outpatient follow-up.  9.  Acute metabolic  encephalopathy Likely secondary to problem #1 and pneumonia in the setting of missed HD session. -Improved.  Currently at baseline.   10.  Hypokalemia Patient with end-stage renal disease on hemodialysis.  Patient for HD today.  Electrolyte management per nephrology.     DVT prophylaxis: Eliquis Code Status: Full Family Communication: Updated patient.  No family at bedside. Disposition:   Status is: Inpatient    Dispo: The patient is from: Home              Anticipated d/c is to: Home              Patient currently was in A. fib with RVR, cardiology following, being transitioned from IV to oral medications.  Transition from IV heparin to Eliquis.  On subcutaneous insulin 70/30.  For hemodialysis today.  Not stable for discharge.    Difficult to place patient no       Consultants:   PCCM admission  Cardiology: Dr. Margaretann Loveless 03/20/2020  Nephrology  Procedures:   2D echo 03/18/2020  Chest x-ray 03/16/2020 >>>> right lower lobe opacity  Antimicrobials:   IV azithromycin 03/16/2020>>>>  IV Rocephin 03/16/2020>>>>   Subjective: Patient sleeping but arousable.  Denies any shortness of breath.  No chest pain.  No bleeding.  No emesis did have some nausea this morning.  Stated tolerated breakfast.    Objective: Vitals:   03/21/20 0000 03/21/20 0550 03/21/20 0552 03/21/20 0843  BP: Marland Kitchen)  146/74 140/77  (!) 148/84  Pulse: 70 73  70  Resp: '18 18  18  '$ Temp: 98.7 F (37.1 C) 97.9 F (36.6 C)  98.3 F (36.8 C)  TempSrc: Oral Oral  Oral  SpO2: 99% 100%  98%  Weight:   104.6 kg     Intake/Output Summary (Last 24 hours) at 03/21/2020 1124 Last data filed at 03/21/2020 [id number] Gross per 24 hour  Intake 1196 ml  Output 2050 ml  Net -854 ml   Filed Weights   03/19/20 1515 03/20/20 0100 03/21/20 0552  Weight: 106.6 kg 109.1 kg 104.6 kg    Examination:  General exam: : NAD Respiratory system: CTA B.  No wheezes, no rhonchi.  Speaking in full sentences.  Normal respiratory  effort. Cardiovascular system: Regular rate and rhythm no murmurs rubs or gallops.  No JVD.  No lower extremity edema.  Gastrointestinal system: Abdomen soft, nontender, nondistended, positive bowel sounds.  No rebound.  No guarding. Central nervous system: Alert and oriented. No focal neurological deficits. Extremities: Symmetric 5 x 5 power. Skin: No rashes, lesions or ulcers Psychiatry: Judgement and insight appear normal. Mood & affect appropriate.  Data Reviewed: I have personally reviewed following labs and imaging studies  CBC: Recent Labs  Lab 03/16/20 1334 03/16/20 1423 03/17/20 0453 03/17/20 1601 03/18/20 0509 03/19/20 0054 03/20/20 0249  WBC 12.6*  --  8.9 11.1* 8.9 5.0 5.5  NEUTROABS 10.1*  --   --   --   --   --   --   HGB 11.7*   < > 10.8* 12.8* 12.0* 11.2* 11.2*  HCT 33.7*   < > 29.0* 35.8* 33.3* 30.9* 30.6*  MCV 86.2  --  80.8 81.9 83.7 83.1 82.9  PLT 171  --  136* 159 108* 83* 87*   < > = values in this interval not displayed.    Basic Metabolic Panel: Recent Labs  Lab 03/17/20 0836 03/17/20 1601 03/18/20 0509 03/19/20 0054 03/20/20 0249  NA 138 137 138 134* 137  K 3.0* 2.9* 3.6 3.2* 3.6  CL 98 97* 98 98 98  CO2 '28 26 27 25 27  '$ GLUCOSE 211* 223* 199* 171* 72  BUN 39* 37* 43* 50* 29*  CREATININE 7.35* 7.94* 9.03* 10.06* 7.70*  CALCIUM 9.0 8.9 8.6* 8.0* 8.5*  MG  --   --   --  2.3  --   PHOS  --  2.8  --   --  3.8    GFR: Estimated Creatinine Clearance: 12.9 mL/min (A) (by C-G formula based on SCr of 7.7 mg/dL (H)).  Liver Function Tests: Recent Labs  Lab 03/16/20 1334 03/17/20 1601 03/20/20 0249  AST 29  --   --   ALT 27  --   --   ALKPHOS 85  --   --   BILITOT 2.7*  --   --   PROT 7.0  --   --   ALBUMIN 3.3* 3.1* 2.6*    CBG: Recent Labs  Lab 03/20/20 0614 03/20/20 1149 03/20/20 1636 03/21/20 0003 03/21/20 0546  GLUCAP 113* 182* 130* 79 206*     Recent Results (from the past 240 hour(s))  Culture, blood (Routine X 2) w  Reflex to ID Panel     Status: None (Preliminary result)   Collection Time: 03/16/20  1:35 PM   Specimen: BLOOD  Result Value Ref Range Status   Specimen Description BLOOD RIGHT ANTECUBITAL  Final   Special Requests   Final  BOTTLES DRAWN AEROBIC AND ANAEROBIC Blood Culture results may not be optimal due to an inadequate volume of blood received in culture bottles   Culture   Final    NO GROWTH 4 DAYS Performed at [facility], [place] [address], [place], [place] [address]    Report Status PENDING  Incomplete  Culture, blood (Routine X 2) w Reflex to ID Panel     Status: None (Preliminary result)   Collection Time: 03/16/20  1:40 PM   Specimen: BLOOD  Result Value Ref Range Status   Specimen Description BLOOD RIGHT ANTECUBITAL  Final   Special Requests   Final    BOTTLES DRAWN AEROBIC AND ANAEROBIC Blood Culture results may not be optimal due to an inadequate volume of blood received in culture bottles   Culture   Final    NO GROWTH 4 DAYS Performed at [facility], [place] [address], [place], [place] [address]    Report Status PENDING  Incomplete  Resp Panel by RT-PCR (Flu A&B, Covid) Nasopharyngeal Swab     Status: None   Collection Time: 03/16/20  2:34 PM   Specimen: Nasopharyngeal Swab; Nasopharyngeal(NP) swabs in vial transport medium  Result Value Ref Range Status   SARS Coronavirus 2 by RT PCR NEGATIVE NEGATIVE Final    Comment: (NOTE) SARS-CoV-2 target nucleic acids are NOT DETECTED.  The SARS-CoV-2 RNA is generally detectable in upper respiratory specimens during the acute phase of infection. The lowest concentration of SARS-CoV-2 viral copies this assay can detect is 138 copies/mL. A negative result does not preclude SARS-Cov-2 infection and should not be used as the sole basis for treatment or other patient management decisions. A negative result may occur with  improper specimen collection/handling, submission of specimen other than nasopharyngeal swab,  presence of viral mutation(s) within the areas targeted by this assay, and inadequate number of viral copies(<138 copies/mL). A negative result must be combined with clinical observations, patient history, and epidemiological information. The expected result is Negative.  Fact Sheet for Patients:  EntrepreneurPulse.com.au  Fact Sheet for Healthcare Providers:  IncredibleEmployment.be  This test is no t yet approved or cleared by the [place] FDA and  has been authorized for detection and/or diagnosis of SARS-CoV-2 by FDA under an Emergency Use Authorization (EUA). This EUA will remain  in effect (meaning this test can be used) for the duration of the COVID-19 declaration under Section 564(b)(1) of the Act, 21 U.S.C.section 360bbb-3(b)(1), unless the authorization is terminated  or revoked sooner.       Influenza A by PCR NEGATIVE NEGATIVE Final   Influenza B by PCR NEGATIVE NEGATIVE Final    Comment: (NOTE) The Xpert Xpress SARS-CoV-2/FLU/RSV plus assay is intended as an aid in the diagnosis of influenza from Nasopharyngeal swab specimens and should not be used as a sole basis for treatment. Nasal washings and aspirates are unacceptable for Xpert Xpress SARS-CoV-2/FLU/RSV testing.  Fact Sheet for Patients: EntrepreneurPulse.com.au  Fact Sheet for Healthcare Providers: IncredibleEmployment.be  This test is not yet approved or cleared by the [place] FDA and has been authorized for detection and/or diagnosis of SARS-CoV-2 by FDA under an Emergency Use Authorization (EUA). This EUA will remain in effect (meaning this test can be used) for the duration of the COVID-19 declaration under Section 564(b)(1) of the Act, 21 U.S.C. section 360bbb-3(b)(1), unless the authorization is terminated or revoked.  Performed at [facility], [place] [address], [place],  [address]   MRSA PCR Screening  Status: None   Collection Time: 03/16/20  6:23 PM   Specimen: Nasal Mucosa; Nasopharyngeal  Result Value Ref Range Status   MRSA by PCR NEGATIVE NEGATIVE Final    Comment:        The GeneXpert MRSA Assay (FDA approved for NASAL specimens only), is one component of a comprehensive MRSA colonization surveillance program. It is not intended to diagnose MRSA infection nor to guide or monitor treatment for MRSA infections. Performed at [facility], [place] [address] [place], [place] [address]          Radiology Studies: No results found.      Scheduled Meds: . amLODipine  10 mg Oral Daily  . apixaban  5 mg Oral BID  . azithromycin  500 mg Oral Daily  . calcitRIOL  1.5 mcg Oral Q T,Th,Sat-1800  . carvedilol  25 mg Oral BID WC  . Chlorhexidine Gluconate Cloth  6 each Topical Q0600  . Chlorhexidine Gluconate Cloth  6 each Topical Q0600  . ezetimibe  10 mg Oral Daily  . famotidine  20 mg Oral Daily  . hydrALAZINE  50 mg Oral Q8H  . icosapent Ethyl  2 g Oral BID  . insulin aspart  0-6 Units Subcutaneous TID WC  . insulin aspart protamine- aspart  60 Units Subcutaneous BID WC  . losartan  50 mg Oral Daily  . mouth rinse  15 mL Mouth Rinse BID  . metoCLOPramide  10 mg Oral TID AC & HS  . pregabalin  75 mg Oral Daily   Continuous Infusions: . cefTRIAXone (ROCEPHIN)  IV 2 g (03/20/20 1553)     LOS: 5 days    Time spent: 40 minutes    Irine Seal, MD [facility]   To contact the attending provider between 7A-7P or the covering provider during after hours 7P-7A, please log into the web site [email or web address] and access using universal [place] password for that web site. If you do not have the password, please call the hospital operator.  03/21/2020, 11:24 AM

## 2020-03-22 ENCOUNTER — Other Ambulatory Visit: Payer: Self-pay | Admitting: Family Medicine

## 2020-03-22 ENCOUNTER — Other Ambulatory Visit: Payer: Self-pay | Admitting: Endocrinology

## 2020-03-22 NOTE — Telephone Encounter (Signed)
1.  Please schedule f/u appt 2.  Then please refill x 2 mos, pending that appt.  

## 2020-03-22 NOTE — Telephone Encounter (Signed)
Not on current med list.

## 2020-03-23 DIAGNOSIS — N2581 Secondary hyperparathyroidism of renal origin: Secondary | ICD-10-CM | POA: Diagnosis not present

## 2020-03-23 DIAGNOSIS — Z992 Dependence on renal dialysis: Secondary | ICD-10-CM | POA: Diagnosis not present

## 2020-03-23 DIAGNOSIS — D689 Coagulation defect, unspecified: Secondary | ICD-10-CM | POA: Diagnosis not present

## 2020-03-23 DIAGNOSIS — N186 End stage renal disease: Secondary | ICD-10-CM | POA: Diagnosis not present

## 2020-03-23 LAB — CULTURE, BLOOD (ROUTINE X 2)
Culture: NO GROWTH
Culture: NO GROWTH

## 2020-03-23 NOTE — Telephone Encounter (Signed)
Please schedule pt for F/U appt and let me know so that I can refill Basaglar.  Thank you,  Leamon Arnt

## 2020-03-24 NOTE — Telephone Encounter (Signed)
Scheduled appt for this Friday 3/25

## 2020-03-25 DIAGNOSIS — D689 Coagulation defect, unspecified: Secondary | ICD-10-CM | POA: Diagnosis not present

## 2020-03-25 DIAGNOSIS — N2581 Secondary hyperparathyroidism of renal origin: Secondary | ICD-10-CM | POA: Diagnosis not present

## 2020-03-25 DIAGNOSIS — N186 End stage renal disease: Secondary | ICD-10-CM | POA: Diagnosis not present

## 2020-03-25 DIAGNOSIS — Z992 Dependence on renal dialysis: Secondary | ICD-10-CM | POA: Diagnosis not present

## 2020-03-26 ENCOUNTER — Other Ambulatory Visit: Payer: Self-pay

## 2020-03-26 ENCOUNTER — Ambulatory Visit (INDEPENDENT_AMBULATORY_CARE_PROVIDER_SITE_OTHER): Payer: Medicare HMO | Admitting: Endocrinology

## 2020-03-26 VITALS — BP 170/82 | HR 82 | Ht 69.5 in | Wt 240.2 lb

## 2020-03-26 DIAGNOSIS — E1129 Type 2 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: Secondary | ICD-10-CM

## 2020-03-26 LAB — POCT GLYCOSYLATED HEMOGLOBIN (HGB A1C): Hemoglobin A1C: 13.4 % — AB (ref 4.0–5.6)

## 2020-03-26 MED ORDER — INSULIN ASPART PROT & ASPART (70-30 MIX) 100 UNIT/ML ~~LOC~~ SUSP: 60.0000 [IU] | Freq: Two times a day (BID) | SUBCUTANEOUS | 1 refills | Status: DC

## 2020-03-26 MED ORDER — NOVOLIN 70/30 RELION (70-30) 100 UNIT/ML ~~LOC~~ SUSP: 60.0000 [IU] | Freq: Two times a day (BID) | SUBCUTANEOUS | 3 refills | Status: DC

## 2020-03-26 NOTE — Progress Notes (Signed)
Subjective:    Patient ID: Darryl Kelley, male    DOB: 1965-09-03, 55 y.o.   MRN: [id number]  HPI Pt returns for f/u of diabetes mellitus: DM type: 1 Dx'ed: [id number] Complications: ESRD (on HD), toe amputation, and PN.   Therapy: insulin since 2006 DKA: never Severe hypoglycemia: last episode of was in 2015.   Pancreatitis: never Pancreatic imaging: never.   SDOH: pt lives alone, and has transportation problems.  No nearby family or friends.   Other: He eats 2 meals per day (9 AM and 6 PM); he takes multiple daily injections; he declines to add non-insulin rx.   Interval history: Pt says he has been unable to obtain insulin recently, due to insurance.  He was in the hospital with DKA, and was rx'ed 70/30, at hosp d/c.  However, pt says he cannot afford this, also.  He says cbg's are in the 100's.  He cannot afford continuous glucose monitor.  Past Medical History:  Diagnosis Date  . Chronic kidney disease   . Diabetes ([place])   . Dyslipidemia   . ESRD (end stage renal disease) on dialysis ([place])     No past surgical history on file.  Social History   Socioeconomic History  . Marital status: Single    Spouse name: Not on file  . Number of children: Not on file  . Years of education: Not on file  . Highest education level: Not on file  Occupational History  . Not on file  Tobacco Use  . Smoking status: Former Research scientist (life sciences)  . Smokeless tobacco: Never Used  Substance and Sexual Activity  . Alcohol use: Never  . Drug use: Never  . Sexual activity: Not Currently  Other Topics Concern  . Not on file  Social History Narrative  . Not on file   Social Determinants of Health   Financial Resource Strain: Not on file  Food Insecurity: Not on file  Transportation Needs: Not on file  Physical Activity: Not on file  Stress: Not on file  Social Connections: Not on file  Intimate Partner Violence: Not on file    Current Outpatient Medications on File Prior to Visit  Medication Sig  Dispense Refill  . acetaminophen (TYLENOL) 650 MG CR tablet Take 650 mg by mouth every 8 (eight) hours as needed for pain.    Marland Kitchen amLODipine (NORVASC) 5 MG tablet TAKE 1 TABLET BY MOUTH EVERY DAY 30 tablet 8  . apixaban (ELIQUIS) 5 MG TABS tablet Take 1 tablet (5 mg total) by mouth 2 (two) times daily. 60 tablet 2  . B Complex-C-Folic Acid (RENAL) 1 MG CAPS Take 1 capsule by mouth daily.    . carvedilol (COREG) 25 MG tablet Take 1 tablet (25 mg total) by mouth 2 (two) times daily with a meal. 60 tablet 1  . Continuous Blood Gluc Sensor (FREESTYLE LIBRE 14 DAY SENSOR) MISC 1 each by Does not apply route every 14 (fourteen) days. For use with continuous glucose monitoring system. Change every 14 days; E11.9 6 each 3  . ezetimibe (ZETIA) 10 MG tablet Take 10 mg by mouth daily.     . famotidine (PEPCID) 20 MG tablet Take 1 tablet (20 mg total) by mouth daily. 30 tablet 1  . fluticasone (FLONASE) 50 MCG/ACT nasal spray Place 2 sprays into both nostrils daily. 16 g 6  . glucose blood (ONETOUCH VERIO) test strip 1 each by Other route in the morning and at bedtime. And lancets 2/day 200 each 12  .  hydrALAZINE (APRESOLINE) 50 MG tablet Take 1 tablet (50 mg total) by mouth every 8 (eight) hours. 90 tablet 1  . icosapent Ethyl (VASCEPA) 1 g capsule Take 2 capsules (2 g total) by mouth 2 (two) times daily. 180 capsule 0  . Insulin Syringe-Needle U-100 (INSULIN SYRINGE 1CC/31GX5/16") 31G X 5/16" 1 ML MISC 60 Units by Does not apply route 2 (two) times daily. 100 each 0  . losartan (COZAAR) 50 MG tablet Take 50 mg by mouth at bedtime.    . metoCLOPramide (REGLAN) 10 MG tablet Take 1 tablet (10 mg total) by mouth 4 (four) times daily -  before meals and at bedtime. 120 tablet 1  . pregabalin (LYRICA) 150 MG capsule Take 1 capsule (150 mg total) by mouth 2 (two) times daily. 180 capsule 2  . rosuvastatin (CRESTOR) 10 MG tablet TAKE 1 TABLET BY MOUTH EVERY DAY 30 tablet 11  . sevelamer carbonate (RENVELA) 800 MG  tablet TAKE 2 TABLETS BY MOUTH 3 TIMES A DAY AND ONE WITH SNACK (Patient taking differently: Take 800-1,600 mg by mouth See admin instructions. Taking 2 tabs ('1600mg'$ ) three times daily with a meal and 1 tablet ('800mg'$ ) with a snack) 210 tablet 1   No current facility-administered medications on file prior to visit.    Allergies  Allergen Reactions  . Atorvastatin Hives  . Rosuvastatin Rash    Family History  Problem Relation Age of Onset  . Diabetes Neg Hx     BP (!) 170/82 (BP Location: Right Arm, Patient Position: Sitting, Cuff Size: Large)   Pulse 82   Ht 5' 9.5" (1.765 m)   Wt 240 lb 3.2 oz (109 kg)   SpO2 97%   BMI 34.96 kg/m    Review of Systems Denies n/v/sob.      Objective:   Physical Exam VITAL SIGNS:  See vs page.   GENERAL: no distress.   Pulses: dorsalis pedis intact bilat.   MSK: no deformity of the feet CV: no leg edema.   Skin:  no ulcer on the feet.  normal color and temp on the feet. Neuro: sensation is intact to touch on the feet.   Lab Results  Component Value Date   HGBA1C >15.5 (H) 03/16/2020       Assessment & Plan:  Insulin-requiring type 2 DM, with ESRD: uncontrolled SDOH: we discussed how to obtain insulin.   Patient Instructions  check your blood sugar twice a day.  vary the time of day when you check, between before the 3 meals, and at bedtime.  also check if you have symptoms of your blood sugar being too high or too low.  please keep a record of the readings and bring it to your next appointment here (or you can bring the meter itself).  You can write it on any piece of paper.  please call us sooner if your blood sugar goes below 70, or if you have a lot of readings over 200.  I have sent a prescription to [facility], for the insulin.   Please call the customer service number on your insurance, to ask if you can get insulin through mail order. We'll send to whatever mail order pharmacy you choose.   Please come back for a follow-up  appointment in 1 month.

## 2020-03-26 NOTE — Patient Instructions (Addendum)
check your blood sugar twice a day.  vary the time of day when you check, between before the 3 meals, and at bedtime.  also check if you have symptoms of your blood sugar being too high or too low.  please keep a record of the readings and bring it to your next appointment here (or you can bring the meter itself).  You can write it on any piece of paper.  please call us sooner if your blood sugar goes below 70, or if you have a lot of readings over 200.  I have sent a prescription to [facility], for the insulin.   Please call the customer service number on your insurance, to ask if you can get insulin through mail order. We'll send to whatever mail order pharmacy you choose.   Please come back for a follow-up appointment in 1 month.

## 2020-03-27 DIAGNOSIS — N186 End stage renal disease: Secondary | ICD-10-CM | POA: Diagnosis not present

## 2020-03-27 DIAGNOSIS — N2581 Secondary hyperparathyroidism of renal origin: Secondary | ICD-10-CM | POA: Diagnosis not present

## 2020-03-27 DIAGNOSIS — Z992 Dependence on renal dialysis: Secondary | ICD-10-CM | POA: Diagnosis not present

## 2020-03-27 DIAGNOSIS — D689 Coagulation defect, unspecified: Secondary | ICD-10-CM | POA: Diagnosis not present

## 2020-03-30 DIAGNOSIS — D689 Coagulation defect, unspecified: Secondary | ICD-10-CM | POA: Diagnosis not present

## 2020-03-30 DIAGNOSIS — N2581 Secondary hyperparathyroidism of renal origin: Secondary | ICD-10-CM | POA: Diagnosis not present

## 2020-03-30 DIAGNOSIS — N186 End stage renal disease: Secondary | ICD-10-CM | POA: Diagnosis not present

## 2020-03-30 DIAGNOSIS — Z992 Dependence on renal dialysis: Secondary | ICD-10-CM | POA: Diagnosis not present

## 2020-04-01 DIAGNOSIS — D689 Coagulation defect, unspecified: Secondary | ICD-10-CM | POA: Diagnosis not present

## 2020-04-01 DIAGNOSIS — Z992 Dependence on renal dialysis: Secondary | ICD-10-CM | POA: Diagnosis not present

## 2020-04-01 DIAGNOSIS — N186 End stage renal disease: Secondary | ICD-10-CM | POA: Diagnosis not present

## 2020-04-01 DIAGNOSIS — I150 Renovascular hypertension: Secondary | ICD-10-CM | POA: Diagnosis not present

## 2020-04-01 DIAGNOSIS — N2581 Secondary hyperparathyroidism of renal origin: Secondary | ICD-10-CM | POA: Diagnosis not present

## 2020-04-03 DIAGNOSIS — N186 End stage renal disease: Secondary | ICD-10-CM | POA: Diagnosis not present

## 2020-04-03 DIAGNOSIS — Z992 Dependence on renal dialysis: Secondary | ICD-10-CM | POA: Diagnosis not present

## 2020-04-03 DIAGNOSIS — N2581 Secondary hyperparathyroidism of renal origin: Secondary | ICD-10-CM | POA: Diagnosis not present

## 2020-04-03 DIAGNOSIS — D689 Coagulation defect, unspecified: Secondary | ICD-10-CM | POA: Diagnosis not present

## 2020-04-06 DIAGNOSIS — D689 Coagulation defect, unspecified: Secondary | ICD-10-CM | POA: Diagnosis not present

## 2020-04-06 DIAGNOSIS — Z992 Dependence on renal dialysis: Secondary | ICD-10-CM | POA: Diagnosis not present

## 2020-04-06 DIAGNOSIS — N186 End stage renal disease: Secondary | ICD-10-CM | POA: Diagnosis not present

## 2020-04-06 DIAGNOSIS — N2581 Secondary hyperparathyroidism of renal origin: Secondary | ICD-10-CM | POA: Diagnosis not present

## 2020-04-06 NOTE — Progress Notes (Addendum)
Cardiology Office Note:    Date:  04/07/2020   ID:  Darryl Kelley, DOB 1965/05/09, MRN [id number]  PCP:  Gifford Shave, MD   [facility]  Cardiologist:  Werner Lean, MD   Electrophysiologist:  None       Referring MD: Gifford Shave, MD   Chief Complaint:  Hospitalization Follow-up (AFib )    Patient Profile:     Darryl Kelley is a 55 y.o. male with:   Paroxysmal atrial fibrillation   Hypertension w/ Hypertensive Heart Disease   Echocardiogram 3/22: EF 70-75, mod LVH, Gr 1 DD, LVOT gradient 53 mmHg  Hyperlipidemia   Diabetes mellitus   ESRD  Undergoing eval at [facility] for transplant  Normal PET Myocardial Perfusion (11/21)  Hx of endocarditis   LVOT gradient (on echocardiogram 03/2020; 53 mmHg); felt to be due to acute medical illness (admx with DKA)  Chronic Hep C  Prior CV studies: Echocardiogram 03/18/20 LVOT gradient 53 mmHg (increased to 58 mmHg with Valsalva), EF 70-75,no RWMA, mod LVH, Gr 1 DD, normal RVSF  Echocardiogram 11/05/19 ([facility]) Normal EF, mild LVH, trivial MR, trivial TR  PET Myocardial Perfusion 11/05/19 ([facility]) - Myocardial perfusion imaging is normal. No evidence of regadenoson-inducible ischemia.  - Left ventricular systolic function is normal.      History of Present Illness:    Darryl Kelley is followed by Cardiology at [facility].  He is being evaluated for renal transplant.  He was admitted last month with atrial fibrillation in the setting of DKA.  He converted to normal sinus rhythm on diltiazem.  He was noted to have a hyperdynamic LV with mid cavitary obstruction.  This was felt to be due to increased medical demand from critical illness.  His beta-blocker was increased.  He was not taking his Apixaban and this was resumed.  He did have elevated hs-Trops without trend and felt to be due to demand ischemia.  Given his neg workup recently at [facility], no further testing was recommended.  He was also  treated for community acquired pneumonia.  He returns for f/u.    He is here alone.  He has not been taking Apixaban since discharge.  We had a long discussion today about the risks and benefits of anticoagulation.  He has not had any chest discomfort.  He is getting short of breath with exertion.  This has been ongoing since discharge.  He has been able to meet his dry weight at dialysis.  He has not had any bleeding issues.    Past Medical History:  Diagnosis Date  . Chronic kidney disease   . Diabetes ([place])   . Dyslipidemia   . ESRD (end stage renal disease) on dialysis (H[facility])     Current Medications: Current Meds  Medication Sig  . acetaminophen (TYLENOL) 650 MG CR tablet Take 650 mg by mouth every 8 (eight) hours as needed for pain.  Marland Kitchen amLODipine (NORVASC) 5 MG tablet TAKE 1 TABLET BY MOUTH EVERY DAY  . apixaban (ELIQUIS) 5 MG TABS tablet Take 1 tablet (5 mg total) by mouth 2 (two) times daily.  . B Complex-C-Folic Acid (RENAL) 1 MG CAPS Take 1 capsule by mouth daily.  . carvedilol (COREG) 25 MG tablet Take 1 tablet (25 mg total) by mouth 2 (two) times daily with a meal.  . Continuous Blood Gluc Sensor (FREESTYLE LIBRE 14 DAY SENSOR) MISC 1 each by Does not apply route every 14 (fourteen) days. For use with continuous glucose monitoring system.  Change every 14 days; E11.9  . ezetimibe (ZETIA) 10 MG tablet Take 10 mg by mouth daily.   . famotidine (PEPCID) 20 MG tablet Take 1 tablet (20 mg total) by mouth daily.  . fluticasone (FLONASE) 50 MCG/ACT nasal spray Place 2 sprays into both nostrils daily.  Marland Kitchen glucose blood (ONETOUCH VERIO) test strip 1 each by Other route in the morning and at bedtime. And lancets 2/day  . hydrALAZINE (APRESOLINE) 50 MG tablet Take 1 tablet (50 mg total) by mouth every 8 (eight) hours.  Marland Kitchen icosapent Ethyl (VASCEPA) 1 g capsule Take 2 capsules (2 g total) by mouth 2 (two) times daily.  . insulin NPH-regular Human (NOVOLIN 70/30 RELION) (70-30) 100 UNIT/ML  injection Inject 60 Units into the skin 2 (two) times daily with a meal. And syringes 2/day  . Insulin Syringe-Needle U-100 (INSULIN SYRINGE 1CC/31GX5/16") 31G X 5/16" 1 ML MISC 60 Units by Does not apply route 2 (two) times daily.  Marland Kitchen losartan (COZAAR) 50 MG tablet Take 50 mg by mouth at bedtime.  . metoCLOPramide (REGLAN) 10 MG tablet Take 1 tablet (10 mg total) by mouth 4 (four) times daily -  before meals and at bedtime.  . pregabalin (LYRICA) 150 MG capsule Take 1 capsule (150 mg total) by mouth 2 (two) times daily.  . sevelamer carbonate (RENVELA) 800 MG tablet TAKE 2 TABLETS BY MOUTH 3 TIMES A DAY AND ONE WITH SNACK     Allergies:   Atorvastatin and Rosuvastatin   Social History   Tobacco Use  . Smoking status: Former Research scientist (life sciences)  . Smokeless tobacco: Never Used  Substance Use Topics  . Alcohol use: Never  . Drug use: Never     Family Hx: The patient's family history is negative for Diabetes.  ROS   EKGs/Labs/Other Test Reviewed:    EKG:  EKG is   ordered today.  The ekg ordered today demonstrates normal sinus rhythm, heart rate 82, normal axis, subtle T wave inversions 1, V4-V5, QTC 455  Recent Labs: 03/16/2020: ALT 27 03/19/2020: Magnesium 2.3 03/20/2020: BUN 29; Creatinine, Ser 7.70; Hemoglobin 11.2; Platelets 87; Potassium 3.6; Sodium 137   Recent Lipid Panel Lab Results  Component Value Date/Time   CHOL 81 (L) 04/16/2019 03:12 PM   TRIG 294 (H) 04/16/2019 03:12 PM   HDL 17 (L) 04/16/2019 03:12 PM   CHOLHDL 4.8 04/16/2019 03:12 PM   LDLCALC 21 04/16/2019 03:12 PM      Risk Assessment/Calculations:    CHA2DS2-VASc Score = 2  This indicates a 2.2% annual risk of stroke. The patient's score is based upon: CHF History: No HTN History: Yes Diabetes History: Yes Stroke History: No Vascular Disease History: No Age Score: 0 Gender Score: 0     Physical Exam:    VS:  BP 130/80   Pulse 79   Ht '5\' 9"'$  (1.753 m)   Wt 237 lb 6.4 oz (107.7 kg)   SpO2 98%   BMI  35.06 kg/m     Wt Readings from Last 3 Encounters:  04/07/20 237 lb 6.4 oz (107.7 kg)  03/26/20 240 lb 3.2 oz (109 kg)  03/21/20 230 lb 8 oz (104.6 kg)     Constitutional:      Appearance: Healthy appearance. Not in distress.  Neck:     Vascular: JVD normal.  Pulmonary:     Effort: Pulmonary effort is normal.     Breath sounds: No wheezing. No rales.  Cardiovascular:     Normal rate. Regular rhythm. Normal  S1. Normal S2.     Murmurs: There is no murmur.  Edema:    Peripheral edema absent.  Abdominal:     Palpations: Abdomen is soft.  Skin:    General: Skin is warm and dry.  Neurological:     General: No focal deficit present.     Mental Status: Alert and oriented to person, place and time.     Cranial Nerves: Cranial nerves are intact.         ASSESSMENT & PLAN:    1. Paroxysmal atrial fibrillation (HCC) Maintaining sinus rhythm.  CHA2DS2-VASc Score = 2 [CHF History: No, HTN History: Yes, Diabetes History: Yes, Stroke History: No, Vascular Disease History: No, Age Score: 0, Gender Score: 0].  Therefore, the patient's annual risk of stroke is 2.2 %.  We discussed the importance of him remaining on anticoagulation to prevent strokes.  He agrees to resume Apixaban today.  Continue beta-blocker therapy.  2. 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 LVH (left ventricular hypertrophy) He had a resting LVOT gradient on echocardiogram at the hospital (53 mmHg) which is all likely due to LVH from uncontrolled HTN.  I reviewed his case with Dr. Gasper Sells.  His dyspnea with exertion is likely related to deconditioning from his recent illness.  If his shortness of breath continues, we will likely set him up for stress echocardiogram to see what his gradient does during exercise.  He has not been taking hydralazine.  His blood pressure today is fairly optimal.  I will try to obtain blood pressure  results from his dialysis sessions.  If his blood pressures are running high enough, I will increase his carvedilol further to 37.5 mg twice daily (he is >80 kg).  Follow-up in 1-2 months.  4. End stage renal disease [facility]) He remains on Tuesday, Thursday, Saturday dialysis.  He has been evaluated at [facility] for transplant.  5. History of endocarditis Continue SBE prophylaxis.    6. Diabetes mellitus  Recent admission with DKA.  Continue f/u with endocrinology.   7. Elevated Troponin Levels This was thought to be due to demand ischemia.  He describes a cardiac catheterization years ago in [place].  He had a recent low risk PET myocardial perfusion study.  I suspect his shortness of breath is related to deconditioning from his recent medical illness vs related to hypertrophic obstruction.  As noted, we may need to arrange a stress echocardiogram if symptoms continue.  This will be re-evaluated at f/u.      Dispo:  Return in about 2 months (around 06/07/2020) for Routine follow up in 2 months with Dr. Blossom Hoops. .   Medication Adjustments/Labs and Tests Ordered: Current medicines are reviewed at length with the patient today.  Concerns regarding medicines are outlined above.  Tests Ordered: Orders Placed This Encounter  Procedures  . EKG 12-Lead   Medication Changes: No orders of the defined types were placed in this encounter.   Signed, Richardson Dopp, PA-C  04/07/2020 9:42 AM    [facility] [address], [place], [place]  [address] Phone: [phone]; Fax: [phone]

## 2020-04-07 ENCOUNTER — Encounter: Payer: Self-pay | Admitting: Physician Assistant

## 2020-04-07 ENCOUNTER — Other Ambulatory Visit: Payer: Self-pay

## 2020-04-07 ENCOUNTER — Ambulatory Visit (INDEPENDENT_AMBULATORY_CARE_PROVIDER_SITE_OTHER): Payer: Medicare HMO | Admitting: Physician Assistant

## 2020-04-07 ENCOUNTER — Telehealth: Payer: Self-pay | Admitting: *Deleted

## 2020-04-07 VITALS — BP 130/80 | HR 79 | Ht 69.0 in | Wt 237.4 lb

## 2020-04-07 DIAGNOSIS — R778 Other specified abnormalities of plasma proteins: Secondary | ICD-10-CM | POA: Diagnosis not present

## 2020-04-07 DIAGNOSIS — Z8679 Personal history of other diseases of the circulatory system: Secondary | ICD-10-CM | POA: Diagnosis not present

## 2020-04-07 DIAGNOSIS — I517 Cardiomegaly: Secondary | ICD-10-CM | POA: Diagnosis not present

## 2020-04-07 DIAGNOSIS — I132 Hypertensive heart and chronic kidney disease with heart failure and with stage 5 chronic kidney disease, or end stage renal disease: Secondary | ICD-10-CM

## 2020-04-07 DIAGNOSIS — I480 Paroxysmal atrial fibrillation: Secondary | ICD-10-CM

## 2020-04-07 DIAGNOSIS — N186 End stage renal disease: Secondary | ICD-10-CM | POA: Diagnosis not present

## 2020-04-07 DIAGNOSIS — E1129 Type 2 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: Secondary | ICD-10-CM

## 2020-04-07 NOTE — Telephone Encounter (Signed)
Per Richardson Dopp, PA . [facility] on [address] where pt has dialysis  @ [phone] to get pt's  recent bp readings. Will fax to office.

## 2020-04-07 NOTE — Patient Instructions (Signed)
Medication Instructions:  Your physician has recommended you make the following change in your medication:   1.  Restart Eliquis one tablet by mouth ( 5 mg) twice a day.   *If you need a refill on your cardiac medications before your next appointment, please call your pharmacy*   Lab Work: -None  If you have labs (blood work) drawn today and your tests are completely normal, you will receive your results only by: Marland Kitchen MyChart Message (if you have MyChart) OR . A paper copy in the mail If you have any lab test that is abnormal or we need to change your treatment, we will call you to review the results.   Testing/Procedures: -None    Follow-Up: At [facility], you and your health needs are our priority.  As part of our continuing mission to provide you with exceptional heart care, we have created designated Provider Care Teams.  These Care Teams include your primary Cardiologist (physician) and Advanced Practice Providers (APPs -  Physician Assistants and Nurse Practitioners) who all work together to provide you with the care you need, when you need it.  We recommend signing up for the patient portal called "MyChart".  Sign up information is provided on this After Visit Summary.  MyChart is used to connect with patients for Virtual Visits (Telemedicine).  Patients are able to view lab/test results, encounter notes, upcoming appointments, etc.  Non-urgent messages can be sent to your provider as well.   To learn more about what you can do with MyChart, go to [email or web address].    Your next appointment:   2 month(s)  The format for your next appointment:   In Person  Provider:   Rudean Haskell, MD   Other Instructions -None

## 2020-04-08 DIAGNOSIS — D689 Coagulation defect, unspecified: Secondary | ICD-10-CM | POA: Diagnosis not present

## 2020-04-08 DIAGNOSIS — N186 End stage renal disease: Secondary | ICD-10-CM | POA: Diagnosis not present

## 2020-04-08 DIAGNOSIS — Z992 Dependence on renal dialysis: Secondary | ICD-10-CM | POA: Diagnosis not present

## 2020-04-08 DIAGNOSIS — N2581 Secondary hyperparathyroidism of renal origin: Secondary | ICD-10-CM | POA: Diagnosis not present

## 2020-04-10 DIAGNOSIS — N2581 Secondary hyperparathyroidism of renal origin: Secondary | ICD-10-CM | POA: Diagnosis not present

## 2020-04-10 DIAGNOSIS — N186 End stage renal disease: Secondary | ICD-10-CM | POA: Diagnosis not present

## 2020-04-10 DIAGNOSIS — Z992 Dependence on renal dialysis: Secondary | ICD-10-CM | POA: Diagnosis not present

## 2020-04-10 DIAGNOSIS — D689 Coagulation defect, unspecified: Secondary | ICD-10-CM | POA: Diagnosis not present

## 2020-04-11 ENCOUNTER — Other Ambulatory Visit: Payer: Self-pay | Admitting: Family Medicine

## 2020-04-12 ENCOUNTER — Other Ambulatory Visit: Payer: Self-pay | Admitting: Family Medicine

## 2020-04-13 DIAGNOSIS — T7840XA Allergy, unspecified, initial encounter: Secondary | ICD-10-CM | POA: Diagnosis not present

## 2020-04-13 DIAGNOSIS — N2581 Secondary hyperparathyroidism of renal origin: Secondary | ICD-10-CM | POA: Diagnosis not present

## 2020-04-13 DIAGNOSIS — D689 Coagulation defect, unspecified: Secondary | ICD-10-CM | POA: Diagnosis not present

## 2020-04-13 DIAGNOSIS — E1129 Type 2 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: Secondary | ICD-10-CM | POA: Diagnosis not present

## 2020-04-13 DIAGNOSIS — N186 End stage renal disease: Secondary | ICD-10-CM | POA: Diagnosis not present

## 2020-04-13 DIAGNOSIS — Z992 Dependence on renal dialysis: Secondary | ICD-10-CM | POA: Diagnosis not present

## 2020-04-14 ENCOUNTER — Telehealth: Payer: Self-pay | Admitting: Physician Assistant

## 2020-04-14 ENCOUNTER — Other Ambulatory Visit: Payer: Self-pay | Admitting: *Deleted

## 2020-04-14 MED ORDER — CARVEDILOL 25 MG PO TABS: 37.5000 mg | ORAL_TABLET | Freq: Two times a day (BID) | ORAL | 3 refills | Status: DC

## 2020-04-14 MED ORDER — CARVEDILOL 25 MG PO TABS
37.5000 mg | ORAL_TABLET | Freq: Two times a day (BID) | ORAL | 3 refills | Status: DC
Start: 2020-04-14 — End: 2020-04-16

## 2020-04-14 NOTE — Telephone Encounter (Signed)
S/w pt is agreeable to plan will increase coreg one and one half tablet by mouth ( 37.5 mg) bid. Pt does not have a pill cutter and this office does not have any. Reached out to Raquel Sarna to see if she could mail pt one. Confirmed pt's address.

## 2020-04-14 NOTE — Telephone Encounter (Signed)
Received BP readings from dialysis. I think he can tolerate increasing his carvedilol.  When I saw him 4/6, he said he was not taking hydralazine.  It is still on his list. PLAN:  Increase carvedilol to 37.5 mg twice daily If he is taking hydralazine again, find out what does and let me know. Richardson Dopp, PA-C    04/14/2020 3:44 PM

## 2020-04-15 ENCOUNTER — Telehealth: Payer: Self-pay | Admitting: Licensed Clinical Social Worker

## 2020-04-15 DIAGNOSIS — N186 End stage renal disease: Secondary | ICD-10-CM | POA: Diagnosis not present

## 2020-04-15 DIAGNOSIS — D689 Coagulation defect, unspecified: Secondary | ICD-10-CM | POA: Diagnosis not present

## 2020-04-15 DIAGNOSIS — T7840XA Allergy, unspecified, initial encounter: Secondary | ICD-10-CM | POA: Diagnosis not present

## 2020-04-15 DIAGNOSIS — N2581 Secondary hyperparathyroidism of renal origin: Secondary | ICD-10-CM | POA: Diagnosis not present

## 2020-04-15 DIAGNOSIS — E1129 Type 2 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: Secondary | ICD-10-CM | POA: Diagnosis not present

## 2020-04-15 DIAGNOSIS — Z992 Dependence on renal dialysis: Secondary | ICD-10-CM | POA: Diagnosis not present

## 2020-04-15 NOTE — Telephone Encounter (Signed)
CSW referred to assist patient with obtaining a pill cutter. CSW contacted patient to inform pill cutter will be delivered to home. Patient grateful for support and assistance. CSW available as needed. Raquel Sarna, [place], [place]

## 2020-04-16 ENCOUNTER — Other Ambulatory Visit: Payer: Self-pay | Admitting: *Deleted

## 2020-04-16 MED ORDER — CARVEDILOL 25 MG PO TABS: 25.0000 mg | ORAL_TABLET | Freq: Two times a day (BID) | ORAL | 3 refills | Status: DC

## 2020-04-16 NOTE — Telephone Encounter (Signed)
S/w pt to let pt know Kennyth Lose, Education officer, museum is sending pt a pill cutter.  Pt stated cannot tolerate the increased dose of Coreg.  Pt stated will go back to one tablet by mouth ( 25 mg) bid, medication list updated.  Will send to [facility] to [place].

## 2020-04-17 DIAGNOSIS — E1129 Type 2 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: Secondary | ICD-10-CM | POA: Diagnosis not present

## 2020-04-17 DIAGNOSIS — Z992 Dependence on renal dialysis: Secondary | ICD-10-CM | POA: Diagnosis not present

## 2020-04-17 DIAGNOSIS — N186 End stage renal disease: Secondary | ICD-10-CM | POA: Diagnosis not present

## 2020-04-17 DIAGNOSIS — D689 Coagulation defect, unspecified: Secondary | ICD-10-CM | POA: Diagnosis not present

## 2020-04-17 DIAGNOSIS — T7840XA Allergy, unspecified, initial encounter: Secondary | ICD-10-CM | POA: Diagnosis not present

## 2020-04-17 DIAGNOSIS — N2581 Secondary hyperparathyroidism of renal origin: Secondary | ICD-10-CM | POA: Diagnosis not present

## 2020-04-20 DIAGNOSIS — N186 End stage renal disease: Secondary | ICD-10-CM | POA: Diagnosis not present

## 2020-04-20 DIAGNOSIS — Z992 Dependence on renal dialysis: Secondary | ICD-10-CM | POA: Diagnosis not present

## 2020-04-20 DIAGNOSIS — D689 Coagulation defect, unspecified: Secondary | ICD-10-CM | POA: Diagnosis not present

## 2020-04-20 DIAGNOSIS — N2581 Secondary hyperparathyroidism of renal origin: Secondary | ICD-10-CM | POA: Diagnosis not present

## 2020-04-22 DIAGNOSIS — N186 End stage renal disease: Secondary | ICD-10-CM | POA: Diagnosis not present

## 2020-04-22 DIAGNOSIS — Z992 Dependence on renal dialysis: Secondary | ICD-10-CM | POA: Diagnosis not present

## 2020-04-22 DIAGNOSIS — D689 Coagulation defect, unspecified: Secondary | ICD-10-CM | POA: Diagnosis not present

## 2020-04-22 DIAGNOSIS — N2581 Secondary hyperparathyroidism of renal origin: Secondary | ICD-10-CM | POA: Diagnosis not present

## 2020-04-24 DIAGNOSIS — D689 Coagulation defect, unspecified: Secondary | ICD-10-CM | POA: Diagnosis not present

## 2020-04-24 DIAGNOSIS — N2581 Secondary hyperparathyroidism of renal origin: Secondary | ICD-10-CM | POA: Diagnosis not present

## 2020-04-24 DIAGNOSIS — N186 End stage renal disease: Secondary | ICD-10-CM | POA: Diagnosis not present

## 2020-04-24 DIAGNOSIS — Z992 Dependence on renal dialysis: Secondary | ICD-10-CM | POA: Diagnosis not present

## 2020-04-27 DIAGNOSIS — N186 End stage renal disease: Secondary | ICD-10-CM | POA: Diagnosis not present

## 2020-04-27 DIAGNOSIS — Z992 Dependence on renal dialysis: Secondary | ICD-10-CM | POA: Diagnosis not present

## 2020-04-27 DIAGNOSIS — N2581 Secondary hyperparathyroidism of renal origin: Secondary | ICD-10-CM | POA: Diagnosis not present

## 2020-04-27 DIAGNOSIS — D689 Coagulation defect, unspecified: Secondary | ICD-10-CM | POA: Diagnosis not present

## 2020-04-29 DIAGNOSIS — N2581 Secondary hyperparathyroidism of renal origin: Secondary | ICD-10-CM | POA: Diagnosis not present

## 2020-04-29 DIAGNOSIS — Z992 Dependence on renal dialysis: Secondary | ICD-10-CM | POA: Diagnosis not present

## 2020-04-29 DIAGNOSIS — N186 End stage renal disease: Secondary | ICD-10-CM | POA: Diagnosis not present

## 2020-04-29 DIAGNOSIS — D689 Coagulation defect, unspecified: Secondary | ICD-10-CM | POA: Diagnosis not present

## 2020-05-01 DIAGNOSIS — Z992 Dependence on renal dialysis: Secondary | ICD-10-CM | POA: Diagnosis not present

## 2020-05-01 DIAGNOSIS — N2581 Secondary hyperparathyroidism of renal origin: Secondary | ICD-10-CM | POA: Diagnosis not present

## 2020-05-01 DIAGNOSIS — N186 End stage renal disease: Secondary | ICD-10-CM | POA: Diagnosis not present

## 2020-05-01 DIAGNOSIS — I150 Renovascular hypertension: Secondary | ICD-10-CM | POA: Diagnosis not present

## 2020-05-01 DIAGNOSIS — D689 Coagulation defect, unspecified: Secondary | ICD-10-CM | POA: Diagnosis not present

## 2020-05-04 DIAGNOSIS — N186 End stage renal disease: Secondary | ICD-10-CM | POA: Diagnosis not present

## 2020-05-04 DIAGNOSIS — Z992 Dependence on renal dialysis: Secondary | ICD-10-CM | POA: Diagnosis not present

## 2020-05-04 DIAGNOSIS — N2581 Secondary hyperparathyroidism of renal origin: Secondary | ICD-10-CM | POA: Diagnosis not present

## 2020-05-04 DIAGNOSIS — D689 Coagulation defect, unspecified: Secondary | ICD-10-CM | POA: Diagnosis not present

## 2020-05-05 ENCOUNTER — Ambulatory Visit (INDEPENDENT_AMBULATORY_CARE_PROVIDER_SITE_OTHER): Payer: Medicare HMO | Admitting: Endocrinology

## 2020-05-05 ENCOUNTER — Other Ambulatory Visit: Payer: Self-pay

## 2020-05-05 VITALS — BP 120/86 | HR 85 | Ht 69.0 in | Wt 235.4 lb

## 2020-05-05 DIAGNOSIS — E1129 Type 2 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: Secondary | ICD-10-CM | POA: Diagnosis not present

## 2020-05-05 MED ORDER — NOVOLIN 70/30 RELION (70-30) 100 UNIT/ML ~~LOC~~ SUSP: SUBCUTANEOUS | 3 refills | Status: DC

## 2020-05-05 NOTE — Patient Instructions (Addendum)
check your blood sugar twice a day.  vary the time of day when you check, between before the 3 meals, and at bedtime.  also check if you have symptoms of your blood sugar being too high or too low.  please keep a record of the readings and bring it to your next appointment here (or you can bring the meter itself).  You can write it on any piece of paper.  please call us sooner if your blood sugar goes below 70, or if you have a lot of readings over 200.  Please increase the insulin to 80 units with breakfast, and 60 units with supper.   On this type of insulin schedule, you should eat meals on a regular schedule.  If a meal is missed or significantly delayed, your blood sugar could go low.  In particular, you should eat breakfast and take your insulin before dialysis. Please come back for a follow-up appointment in 3-4 weeks.

## 2020-05-05 NOTE — Progress Notes (Signed)
Subjective:    Patient ID: Darryl Kelley, male    DOB: [birth year], 55 y.o.   MRN: [id number]  HPI Pt returns for f/u of diabetes mellitus: DM type: 1 Dx'ed: [id number] Complications: ESRD (on HD), toe amputation, and PN.   Therapy: insulin since 2006 DKA: once (2021) Severe hypoglycemia: last episode of was in 2015.   Pancreatitis: never Pancreatic imaging: never.   SDOH: pt lives alone, and has financial/transportation problems (so he is not a candidate for multiple daily injections).  No nearby family or friends.   Other: He eats 2 meals per day (9 AM and 6 PM); he takes multiple daily injections; he declines to add non-insulin rx.   Interval history: Meter is downloaded today, and the printout is scanned into the record.  cbg varies from 62-400.  It is in general higher as the day goes on.  It is lowest after a missed meal.   Past Medical History:  Diagnosis Date  . Chronic kidney disease   . Diabetes ([place])   . Dyslipidemia   . ESRD (end stage renal disease) on dialysis ([place])     No past surgical history on file.  Social History   Socioeconomic History  . Marital status: Single    Spouse name: Not on file  . Number of children: Not on file  . Years of education: Not on file  . Highest education level: Not on file  Occupational History  . Not on file  Tobacco Use  . Smoking status: Former Research scientist (life sciences)  . Smokeless tobacco: Never Used  Substance and Sexual Activity  . Alcohol use: Never  . Drug use: Never  . Sexual activity: Not Currently  Other Topics Concern  . Not on file  Social History Narrative  . Not on file   Social Determinants of Health   Financial Resource Strain: Not on file  Food Insecurity: Not on file  Transportation Needs: Not on file  Physical Activity: Not on file  Stress: Not on file  Social Connections: Not on file  Intimate Partner Violence: Not on file    Current Outpatient Medications on File Prior to Visit  Medication Sig Dispense Refill   . acetaminophen (TYLENOL) 650 MG CR tablet Take 650 mg by mouth every 8 (eight) hours as needed for pain.    Marland Kitchen amLODipine (NORVASC) 5 MG tablet TAKE 1 TABLET BY MOUTH EVERY DAY 30 tablet 8  . apixaban (ELIQUIS) 5 MG TABS tablet Take 1 tablet (5 mg total) by mouth 2 (two) times daily. 60 tablet 2  . B Complex-C-Folic Acid (RENAL) 1 MG CAPS Take 1 capsule by mouth daily.    . carvedilol (COREG) 25 MG tablet Take 1 tablet (25 mg total) by mouth 2 (two) times daily with a meal. 180 tablet 3  . Continuous Blood Gluc Sensor (FREESTYLE LIBRE 14 DAY SENSOR) MISC 1 each by Does not apply route every 14 (fourteen) days. For use with continuous glucose monitoring system. Change every 14 days; E11.9 (Patient not taking: Reported on 05/05/2020) 6 each 3  . ezetimibe (ZETIA) 10 MG tablet Take 10 mg by mouth daily.     . famotidine (PEPCID) 20 MG tablet Take 1 tablet (20 mg total) by mouth daily. 30 tablet 1  . fluticasone (FLONASE) 50 MCG/ACT nasal spray Place 2 sprays into both nostrils daily. 16 g 6  . glucose blood (ONETOUCH VERIO) test strip 1 each by Other route in the morning and at bedtime. And lancets 2/day 200 each  12  . Insulin Syringe-Needle U-100 (INSULIN SYRINGE 1CC/31GX5/16") 31G X 5/16" 1 ML MISC 60 Units by Does not apply route 2 (two) times daily. 100 each 0  . losartan (COZAAR) 50 MG tablet Take 50 mg by mouth at bedtime.    . metoCLOPramide (REGLAN) 10 MG tablet Take 1 tablet (10 mg total) by mouth 4 (four) times daily -  before meals and at bedtime. 120 tablet 1  . pregabalin (LYRICA) 150 MG capsule TAKE 1 CAPSULE BY MOUTH TWICE A DAY 180 capsule 1  . sevelamer carbonate (RENVELA) 800 MG tablet TAKE 2 TABLETS BY MOUTH 3 TIMES A DAY AND ONE WITH SNACK 210 tablet 1  . VASCEPA 1 g capsule TAKE 2 CAPSULES BY MOUTH 2 TIMES DAILY. 120 capsule 1   No current facility-administered medications on file prior to visit.    Allergies  Allergen Reactions  . Atorvastatin Hives  . Rosuvastatin Rash     Family History  Problem Relation Age of Onset  . Diabetes Neg Hx     BP 120/86 (BP Location: Right Arm, Patient Position: Sitting, Cuff Size: Large)   Pulse 85   Ht '5\' 9"'$  (1.753 m)   Wt 235 lb 6.4 oz (106.8 kg)   SpO2 97%   BMI 34.76 kg/m    Review of Systems     Objective:   Physical Exam VITAL SIGNS:  See vs page GENERAL: no distress Pulses: dorsalis pedis intact bilat.   MSK: no deformity of the feet CV: no leg edema Skin:  no ulcer on the feet.  normal color and temp on the feet. Neuro: sensation is intact to touch on the feet.   Ext: there is bilateral onychomycosis of the toenails.        Assessment & Plan:  Type 1 DM: uncontrolled.  Hypoglycemia, due to insulin, and missing meals.   Patient Instructions  check your blood sugar twice a day.  vary the time of day when you check, between before the 3 meals, and at bedtime.  also check if you have symptoms of your blood sugar being too high or too low.  please keep a record of the readings and bring it to your next appointment here (or you can bring the meter itself).  You can write it on any piece of paper.  please call us sooner if your blood sugar goes below 70, or if you have a lot of readings over 200.  Please increase the insulin to 80 units with breakfast, and 60 units with supper.   On this type of insulin schedule, you should eat meals on a regular schedule.  If a meal is missed or significantly delayed, your blood sugar could go low.  In particular, you should eat breakfast and take your insulin before dialysis. Please come back for a follow-up appointment in 3-4 weeks.

## 2020-05-06 DIAGNOSIS — N186 End stage renal disease: Secondary | ICD-10-CM | POA: Diagnosis not present

## 2020-05-06 DIAGNOSIS — Z992 Dependence on renal dialysis: Secondary | ICD-10-CM | POA: Diagnosis not present

## 2020-05-06 DIAGNOSIS — D689 Coagulation defect, unspecified: Secondary | ICD-10-CM | POA: Diagnosis not present

## 2020-05-06 DIAGNOSIS — N2581 Secondary hyperparathyroidism of renal origin: Secondary | ICD-10-CM | POA: Diagnosis not present

## 2020-05-08 DIAGNOSIS — N186 End stage renal disease: Secondary | ICD-10-CM | POA: Diagnosis not present

## 2020-05-08 DIAGNOSIS — D689 Coagulation defect, unspecified: Secondary | ICD-10-CM | POA: Diagnosis not present

## 2020-05-08 DIAGNOSIS — Z992 Dependence on renal dialysis: Secondary | ICD-10-CM | POA: Diagnosis not present

## 2020-05-08 DIAGNOSIS — N2581 Secondary hyperparathyroidism of renal origin: Secondary | ICD-10-CM | POA: Diagnosis not present

## 2020-05-11 DIAGNOSIS — N2581 Secondary hyperparathyroidism of renal origin: Secondary | ICD-10-CM | POA: Diagnosis not present

## 2020-05-11 DIAGNOSIS — N186 End stage renal disease: Secondary | ICD-10-CM | POA: Diagnosis not present

## 2020-05-11 DIAGNOSIS — D689 Coagulation defect, unspecified: Secondary | ICD-10-CM | POA: Diagnosis not present

## 2020-05-11 DIAGNOSIS — Z992 Dependence on renal dialysis: Secondary | ICD-10-CM | POA: Diagnosis not present

## 2020-05-13 DIAGNOSIS — Z992 Dependence on renal dialysis: Secondary | ICD-10-CM | POA: Diagnosis not present

## 2020-05-13 DIAGNOSIS — D689 Coagulation defect, unspecified: Secondary | ICD-10-CM | POA: Diagnosis not present

## 2020-05-13 DIAGNOSIS — N186 End stage renal disease: Secondary | ICD-10-CM | POA: Diagnosis not present

## 2020-05-13 DIAGNOSIS — N2581 Secondary hyperparathyroidism of renal origin: Secondary | ICD-10-CM | POA: Diagnosis not present

## 2020-05-15 DIAGNOSIS — N186 End stage renal disease: Secondary | ICD-10-CM | POA: Diagnosis not present

## 2020-05-15 DIAGNOSIS — Z992 Dependence on renal dialysis: Secondary | ICD-10-CM | POA: Diagnosis not present

## 2020-05-15 DIAGNOSIS — N2581 Secondary hyperparathyroidism of renal origin: Secondary | ICD-10-CM | POA: Diagnosis not present

## 2020-05-15 DIAGNOSIS — D689 Coagulation defect, unspecified: Secondary | ICD-10-CM | POA: Diagnosis not present

## 2020-05-18 ENCOUNTER — Telehealth: Payer: Self-pay

## 2020-05-18 ENCOUNTER — Other Ambulatory Visit: Payer: Self-pay | Admitting: Endocrinology

## 2020-05-18 ENCOUNTER — Other Ambulatory Visit: Payer: Self-pay | Admitting: Family Medicine

## 2020-05-18 DIAGNOSIS — N186 End stage renal disease: Secondary | ICD-10-CM | POA: Diagnosis not present

## 2020-05-18 DIAGNOSIS — Z992 Dependence on renal dialysis: Secondary | ICD-10-CM | POA: Diagnosis not present

## 2020-05-18 DIAGNOSIS — Z111 Encounter for screening for respiratory tuberculosis: Secondary | ICD-10-CM | POA: Diagnosis not present

## 2020-05-18 DIAGNOSIS — N2581 Secondary hyperparathyroidism of renal origin: Secondary | ICD-10-CM | POA: Diagnosis not present

## 2020-05-18 DIAGNOSIS — D689 Coagulation defect, unspecified: Secondary | ICD-10-CM | POA: Diagnosis not present

## 2020-05-18 NOTE — Telephone Encounter (Signed)
Pt is requesting samples of Eliquis 5 mg tablets. Pt states that his medication cost to much and he can not afford it. Jeani Hawking, LPN, can you please advise on this matter? Please address

## 2020-05-19 MED ORDER — APIXABAN 5 MG PO TABS: 5.0000 mg | ORAL_TABLET | Freq: Two times a day (BID) | ORAL | 2 refills | Status: AC

## 2020-05-19 NOTE — Telephone Encounter (Signed)
**Note De-Identified Imad Shostak Obfuscation** I have started a Eliquis tier exception through covermymeds. Key: [id number]

## 2020-05-19 NOTE — Telephone Encounter (Signed)
**Note De-Identified Darryl Kelley Obfuscation** I e-scribed the pts [facility] then called to get the pts cost and was advised that it is to soon to request a refill. I then called the pt who states that he took his printed Eliquis RX that was provided to him at the hospital to [facility] but did not purchase as he states he was advised that it would cost him $195 but he is unsure of the quantity amount.  I called [facility] and was advised that a 30 day supply will cost the pt $145 and that a PA is not required. I have sent the pt a [facility] message as we are having issues with our calls dropping.

## 2020-05-20 DIAGNOSIS — Z111 Encounter for screening for respiratory tuberculosis: Secondary | ICD-10-CM | POA: Diagnosis not present

## 2020-05-20 DIAGNOSIS — N186 End stage renal disease: Secondary | ICD-10-CM | POA: Diagnosis not present

## 2020-05-20 DIAGNOSIS — D689 Coagulation defect, unspecified: Secondary | ICD-10-CM | POA: Diagnosis not present

## 2020-05-20 DIAGNOSIS — Z992 Dependence on renal dialysis: Secondary | ICD-10-CM | POA: Diagnosis not present

## 2020-05-20 DIAGNOSIS — N2581 Secondary hyperparathyroidism of renal origin: Secondary | ICD-10-CM | POA: Diagnosis not present

## 2020-05-22 DIAGNOSIS — N186 End stage renal disease: Secondary | ICD-10-CM | POA: Diagnosis not present

## 2020-05-22 DIAGNOSIS — D689 Coagulation defect, unspecified: Secondary | ICD-10-CM | POA: Diagnosis not present

## 2020-05-22 DIAGNOSIS — Z111 Encounter for screening for respiratory tuberculosis: Secondary | ICD-10-CM | POA: Diagnosis not present

## 2020-05-22 DIAGNOSIS — N2581 Secondary hyperparathyroidism of renal origin: Secondary | ICD-10-CM | POA: Diagnosis not present

## 2020-05-22 DIAGNOSIS — Z992 Dependence on renal dialysis: Secondary | ICD-10-CM | POA: Diagnosis not present

## 2020-05-25 DIAGNOSIS — N186 End stage renal disease: Secondary | ICD-10-CM | POA: Diagnosis not present

## 2020-05-25 DIAGNOSIS — Z992 Dependence on renal dialysis: Secondary | ICD-10-CM | POA: Diagnosis not present

## 2020-05-25 DIAGNOSIS — D689 Coagulation defect, unspecified: Secondary | ICD-10-CM | POA: Diagnosis not present

## 2020-05-25 DIAGNOSIS — N2581 Secondary hyperparathyroidism of renal origin: Secondary | ICD-10-CM | POA: Diagnosis not present

## 2020-05-27 DIAGNOSIS — N2581 Secondary hyperparathyroidism of renal origin: Secondary | ICD-10-CM | POA: Diagnosis not present

## 2020-05-27 DIAGNOSIS — Z992 Dependence on renal dialysis: Secondary | ICD-10-CM | POA: Diagnosis not present

## 2020-05-27 DIAGNOSIS — D689 Coagulation defect, unspecified: Secondary | ICD-10-CM | POA: Diagnosis not present

## 2020-05-27 DIAGNOSIS — N186 End stage renal disease: Secondary | ICD-10-CM | POA: Diagnosis not present

## 2020-05-28 ENCOUNTER — Other Ambulatory Visit: Payer: Self-pay | Admitting: Nephrology

## 2020-05-28 ENCOUNTER — Ambulatory Visit
Admission: RE | Admit: 2020-05-28 | Discharge: 2020-05-28 | Disposition: A | Discharge status: Home / Self Care | Payer: Medicare HMO | Source: Ambulatory Visit | Attending: Nephrology | Admitting: Nephrology

## 2020-05-28 DIAGNOSIS — R7611 Nonspecific reaction to tuberculin skin test without active tuberculosis: Secondary | ICD-10-CM

## 2020-05-29 DIAGNOSIS — Z992 Dependence on renal dialysis: Secondary | ICD-10-CM | POA: Diagnosis not present

## 2020-05-29 DIAGNOSIS — D689 Coagulation defect, unspecified: Secondary | ICD-10-CM | POA: Diagnosis not present

## 2020-05-29 DIAGNOSIS — N186 End stage renal disease: Secondary | ICD-10-CM | POA: Diagnosis not present

## 2020-05-29 DIAGNOSIS — N2581 Secondary hyperparathyroidism of renal origin: Secondary | ICD-10-CM | POA: Diagnosis not present

## 2020-06-01 ENCOUNTER — Ambulatory Visit: Payer: Medicare HMO | Admitting: Internal Medicine

## 2020-06-01 DIAGNOSIS — I150 Renovascular hypertension: Secondary | ICD-10-CM | POA: Diagnosis not present

## 2020-06-01 DIAGNOSIS — Z992 Dependence on renal dialysis: Secondary | ICD-10-CM | POA: Diagnosis not present

## 2020-06-01 DIAGNOSIS — N2581 Secondary hyperparathyroidism of renal origin: Secondary | ICD-10-CM | POA: Diagnosis not present

## 2020-06-01 DIAGNOSIS — N186 End stage renal disease: Secondary | ICD-10-CM | POA: Diagnosis not present

## 2020-06-01 DIAGNOSIS — D689 Coagulation defect, unspecified: Secondary | ICD-10-CM | POA: Diagnosis not present

## 2020-06-02 DIAGNOSIS — Z20822 Contact with and (suspected) exposure to covid-19: Secondary | ICD-10-CM | POA: Diagnosis not present

## 2020-06-02 DIAGNOSIS — Z03818 Encounter for observation for suspected exposure to other biological agents ruled out: Secondary | ICD-10-CM | POA: Diagnosis not present

## 2020-06-02 NOTE — Telephone Encounter (Signed)
**Note De-Identified Cerena Baine Obfuscation** See pt message in the pts chart from 5/18 for update (tier exception denied and pt is aware).

## 2020-06-03 DIAGNOSIS — N2581 Secondary hyperparathyroidism of renal origin: Secondary | ICD-10-CM | POA: Diagnosis not present

## 2020-06-03 DIAGNOSIS — N186 End stage renal disease: Secondary | ICD-10-CM | POA: Diagnosis not present

## 2020-06-03 DIAGNOSIS — Z992 Dependence on renal dialysis: Secondary | ICD-10-CM | POA: Diagnosis not present

## 2020-06-03 DIAGNOSIS — D689 Coagulation defect, unspecified: Secondary | ICD-10-CM | POA: Diagnosis not present

## 2020-06-04 DIAGNOSIS — D689 Coagulation defect, unspecified: Secondary | ICD-10-CM | POA: Diagnosis not present

## 2020-06-04 DIAGNOSIS — Z992 Dependence on renal dialysis: Secondary | ICD-10-CM | POA: Diagnosis not present

## 2020-06-04 DIAGNOSIS — N186 End stage renal disease: Secondary | ICD-10-CM | POA: Diagnosis not present

## 2020-06-04 DIAGNOSIS — N2581 Secondary hyperparathyroidism of renal origin: Secondary | ICD-10-CM | POA: Diagnosis not present

## 2020-06-08 ENCOUNTER — Telehealth: Payer: Self-pay | Admitting: Pharmacist

## 2020-06-08 DIAGNOSIS — N2581 Secondary hyperparathyroidism of renal origin: Secondary | ICD-10-CM | POA: Diagnosis not present

## 2020-06-08 DIAGNOSIS — D689 Coagulation defect, unspecified: Secondary | ICD-10-CM | POA: Diagnosis not present

## 2020-06-08 DIAGNOSIS — Z992 Dependence on renal dialysis: Secondary | ICD-10-CM | POA: Diagnosis not present

## 2020-06-08 DIAGNOSIS — I480 Paroxysmal atrial fibrillation: Secondary | ICD-10-CM

## 2020-06-08 DIAGNOSIS — N186 End stage renal disease: Secondary | ICD-10-CM | POA: Diagnosis not present

## 2020-06-08 MED ORDER — ELIQUIS 5 MG PO TABS: 5.0000 mg | ORAL_TABLET | Freq: Two times a day (BID) | ORAL | 0 refills | Status: DC

## 2020-06-08 NOTE — Telephone Encounter (Signed)
Patient requests samples while waiting for patient assistance to be approved.  Will give two boxes and a free 30 day trial card.

## 2020-06-09 DIAGNOSIS — N2581 Secondary hyperparathyroidism of renal origin: Secondary | ICD-10-CM | POA: Diagnosis not present

## 2020-06-09 DIAGNOSIS — Z992 Dependence on renal dialysis: Secondary | ICD-10-CM | POA: Diagnosis not present

## 2020-06-09 DIAGNOSIS — D689 Coagulation defect, unspecified: Secondary | ICD-10-CM | POA: Diagnosis not present

## 2020-06-09 DIAGNOSIS — N186 End stage renal disease: Secondary | ICD-10-CM | POA: Diagnosis not present

## 2020-06-10 DIAGNOSIS — H52209 Unspecified astigmatism, unspecified eye: Secondary | ICD-10-CM | POA: Diagnosis not present

## 2020-06-10 DIAGNOSIS — H5213 Myopia, bilateral: Secondary | ICD-10-CM | POA: Diagnosis not present

## 2020-06-10 DIAGNOSIS — H5203 Hypermetropia, bilateral: Secondary | ICD-10-CM | POA: Diagnosis not present

## 2020-06-10 DIAGNOSIS — H524 Presbyopia: Secondary | ICD-10-CM | POA: Diagnosis not present

## 2020-06-11 ENCOUNTER — Other Ambulatory Visit: Payer: Self-pay | Admitting: Family Medicine

## 2020-06-12 DIAGNOSIS — D689 Coagulation defect, unspecified: Secondary | ICD-10-CM | POA: Diagnosis not present

## 2020-06-12 DIAGNOSIS — N2581 Secondary hyperparathyroidism of renal origin: Secondary | ICD-10-CM | POA: Diagnosis not present

## 2020-06-12 DIAGNOSIS — N186 End stage renal disease: Secondary | ICD-10-CM | POA: Diagnosis not present

## 2020-06-12 DIAGNOSIS — Z992 Dependence on renal dialysis: Secondary | ICD-10-CM | POA: Diagnosis not present

## 2020-06-14 ENCOUNTER — Encounter: Payer: Self-pay | Admitting: Family Medicine

## 2020-06-15 DIAGNOSIS — N186 End stage renal disease: Secondary | ICD-10-CM | POA: Diagnosis not present

## 2020-06-15 DIAGNOSIS — D689 Coagulation defect, unspecified: Secondary | ICD-10-CM | POA: Diagnosis not present

## 2020-06-15 DIAGNOSIS — N2581 Secondary hyperparathyroidism of renal origin: Secondary | ICD-10-CM | POA: Diagnosis not present

## 2020-06-15 DIAGNOSIS — Z992 Dependence on renal dialysis: Secondary | ICD-10-CM | POA: Diagnosis not present

## 2020-06-16 ENCOUNTER — Ambulatory Visit (INDEPENDENT_AMBULATORY_CARE_PROVIDER_SITE_OTHER): Payer: Medicare HMO | Admitting: Family Medicine

## 2020-06-16 ENCOUNTER — Encounter: Payer: Self-pay | Admitting: Family Medicine

## 2020-06-16 ENCOUNTER — Other Ambulatory Visit: Payer: Self-pay

## 2020-06-16 DIAGNOSIS — Z7184 Encounter for health counseling related to travel: Secondary | ICD-10-CM

## 2020-06-16 DIAGNOSIS — Z20822 Contact with and (suspected) exposure to covid-19: Secondary | ICD-10-CM | POA: Diagnosis not present

## 2020-06-16 DIAGNOSIS — H109 Unspecified conjunctivitis: Secondary | ICD-10-CM | POA: Insufficient documentation

## 2020-06-16 DIAGNOSIS — H1033 Unspecified acute conjunctivitis, bilateral: Secondary | ICD-10-CM | POA: Diagnosis not present

## 2020-06-16 DIAGNOSIS — U071 COVID-19: Secondary | ICD-10-CM | POA: Diagnosis not present

## 2020-06-16 MED ORDER — ERYTHROMYCIN 5 MG/GM OP OINT: 1.0000 "application " | TOPICAL_OINTMENT | Freq: Two times a day (BID) | OPHTHALMIC | 0 refills | Status: DC

## 2020-06-16 MED ORDER — DOXYCYCLINE HYCLATE 100 MG PO TBEC: 100.0000 mg | DELAYED_RELEASE_TABLET | Freq: Every day | ORAL | 0 refills | Status: DC

## 2020-06-16 NOTE — Patient Instructions (Addendum)
It was great seeing you today.  I am concerned to have some kind of viral or bacterial conjunctivitis and have sent a medication for you to use to your pharmacy.  Below is information on your recommended vaccines and treatments needed to go to [place].  If you have any questions or concerns please call the clinic or send me a MyChart message.  I hope you have a wonderful day!  For [place]  - Make sure you are up to date on your COVID boosters and Polio vaccines - Please let your nephrologist know you are going to [place] - Yellow fever vaccine is recommended but not required   - We recommend doxycycline 1-2 days before travel, during the entire trip, and for 1 month after the trip  - The dose is 100 mg per day  - We will send to your pharmacy

## 2020-06-16 NOTE — Progress Notes (Signed)
    SUBJECTIVE:   CHIEF COMPLAINT / HPI:   Patient presents today out of concern for pinkeye Had an eye exam and tried on glasses on Saturday, woke up Sunday with conjunctivitis in both eyes and a large amount of discharge.Patient reports that he was seen at his dialysis center yesterday and they requested that he be evaluated for pinkeye.  Denies any pain at this time but reports additionally he had so.  Has not tried any over-the-counter medications.  With the pharmacy and they told him he needed prescription for any medications to treat his eye concerns.  Reports that when they looked in his eyes over the weekend they did not put any droplets or he did not try any contacts.  Plan for travel Patient is planning on going to [place] for an extended period of time.  He would like the prophylaxis needed for infectious diseases in that area.  He knows that he needs something for malaria and possible yellow fever. OBJECTIVE:   BP 132/83   Pulse 80   Wt 231 lb 9.6 oz (105.1 kg)   SpO2 100%   BMI 34.20 kg/m   General: Pleasant, well-appearing 55 year old male, no acute distress HEENT: Patient has erythema of conjunctive eye and eyes bilaterally.  No excess drainage at this time. Cardiac: Regular rate and rhythm, no murmurs appreciated Respiratory: Normal for breathing, speaking in full sentences  ASSESSMENT/PLAN:   Conjunctivitis Patient has 4-day history of bilateral conjunctivitis.  Reports increased drainage from his eyes and discharge in the mornings.  Appears viral in origin but could be bacterial as well.  Will prescribe erythromycin ointment and strict follow-up precautions given.  Travel advice encounter Patient is planning to travel to [place] for an undetermined amount of time.  We recommend yellow fever vaccination as well as malaria prophylaxis.  We also encouraged him to ensure he has dialysis set up while in [place].  He reports that he has a dialysis center in mind but he has to  get the supplies for the dialysis.  He is in the process of lining that up and not planning on going until September.  Provided patient with prescription for doxycycline which she will take as a prophylaxis but will need to get further doses while there.  He will call with any further questions.     Gifford Shave, MD [place]    \

## 2020-06-16 NOTE — Assessment & Plan Note (Signed)
Patient has 4-day history of bilateral conjunctivitis.  Reports increased drainage from his eyes and discharge in the mornings.  Appears viral in origin but could be bacterial as well.  Will prescribe erythromycin ointment and strict follow-up precautions given.

## 2020-06-16 NOTE — Telephone Encounter (Signed)
Patient brought in his section of patient assistance paperwork as well as a social security benefit statement.  Filled out provider section, had cardiologist sign, and faxed to [facility] [employer].

## 2020-06-16 NOTE — Assessment & Plan Note (Signed)
Patient is planning to travel to [place] for an undetermined amount of time.  We recommend yellow fever vaccination as well as malaria prophylaxis.  We also encouraged him to ensure he has dialysis set up while in [place].  He reports that he has a dialysis center in mind but he has to get the supplies for the dialysis.  He is in the process of lining that up and not planning on going until September.  Provided patient with prescription for doxycycline which she will take as a prophylaxis but will need to get further doses while there.  He will call with any further questions.

## 2020-06-17 DIAGNOSIS — Z992 Dependence on renal dialysis: Secondary | ICD-10-CM | POA: Diagnosis not present

## 2020-06-17 DIAGNOSIS — D689 Coagulation defect, unspecified: Secondary | ICD-10-CM | POA: Diagnosis not present

## 2020-06-17 DIAGNOSIS — N2581 Secondary hyperparathyroidism of renal origin: Secondary | ICD-10-CM | POA: Diagnosis not present

## 2020-06-17 DIAGNOSIS — N186 End stage renal disease: Secondary | ICD-10-CM | POA: Diagnosis not present

## 2020-06-17 DIAGNOSIS — U071 COVID-19: Secondary | ICD-10-CM | POA: Diagnosis not present

## 2020-06-19 DIAGNOSIS — N186 End stage renal disease: Secondary | ICD-10-CM | POA: Diagnosis not present

## 2020-06-19 DIAGNOSIS — D689 Coagulation defect, unspecified: Secondary | ICD-10-CM | POA: Diagnosis not present

## 2020-06-19 DIAGNOSIS — N2581 Secondary hyperparathyroidism of renal origin: Secondary | ICD-10-CM | POA: Diagnosis not present

## 2020-06-19 DIAGNOSIS — Z992 Dependence on renal dialysis: Secondary | ICD-10-CM | POA: Diagnosis not present

## 2020-06-22 DIAGNOSIS — N2581 Secondary hyperparathyroidism of renal origin: Secondary | ICD-10-CM | POA: Diagnosis not present

## 2020-06-22 DIAGNOSIS — N186 End stage renal disease: Secondary | ICD-10-CM | POA: Diagnosis not present

## 2020-06-22 DIAGNOSIS — Z992 Dependence on renal dialysis: Secondary | ICD-10-CM | POA: Diagnosis not present

## 2020-06-22 DIAGNOSIS — U071 COVID-19: Secondary | ICD-10-CM | POA: Diagnosis not present

## 2020-06-22 DIAGNOSIS — D689 Coagulation defect, unspecified: Secondary | ICD-10-CM | POA: Diagnosis not present

## 2020-06-24 DIAGNOSIS — N2581 Secondary hyperparathyroidism of renal origin: Secondary | ICD-10-CM | POA: Diagnosis not present

## 2020-06-24 DIAGNOSIS — N186 End stage renal disease: Secondary | ICD-10-CM | POA: Diagnosis not present

## 2020-06-24 DIAGNOSIS — D689 Coagulation defect, unspecified: Secondary | ICD-10-CM | POA: Diagnosis not present

## 2020-06-24 DIAGNOSIS — Z992 Dependence on renal dialysis: Secondary | ICD-10-CM | POA: Diagnosis not present

## 2020-06-25 DIAGNOSIS — H5213 Myopia, bilateral: Secondary | ICD-10-CM | POA: Diagnosis not present

## 2020-06-25 DIAGNOSIS — H52209 Unspecified astigmatism, unspecified eye: Secondary | ICD-10-CM | POA: Diagnosis not present

## 2020-06-25 DIAGNOSIS — H5203 Hypermetropia, bilateral: Secondary | ICD-10-CM | POA: Diagnosis not present

## 2020-06-25 DIAGNOSIS — H524 Presbyopia: Secondary | ICD-10-CM | POA: Diagnosis not present

## 2020-06-26 DIAGNOSIS — N186 End stage renal disease: Secondary | ICD-10-CM | POA: Diagnosis not present

## 2020-06-26 DIAGNOSIS — D689 Coagulation defect, unspecified: Secondary | ICD-10-CM | POA: Diagnosis not present

## 2020-06-26 DIAGNOSIS — N2581 Secondary hyperparathyroidism of renal origin: Secondary | ICD-10-CM | POA: Diagnosis not present

## 2020-06-26 DIAGNOSIS — Z992 Dependence on renal dialysis: Secondary | ICD-10-CM | POA: Diagnosis not present

## 2020-06-28 ENCOUNTER — Ambulatory Visit (INDEPENDENT_AMBULATORY_CARE_PROVIDER_SITE_OTHER): Payer: Medicare HMO | Admitting: Podiatry

## 2020-06-28 ENCOUNTER — Encounter: Payer: Self-pay | Admitting: Podiatry

## 2020-06-28 ENCOUNTER — Ambulatory Visit (INDEPENDENT_AMBULATORY_CARE_PROVIDER_SITE_OTHER): Payer: Medicare HMO | Admitting: Endocrinology

## 2020-06-28 ENCOUNTER — Other Ambulatory Visit: Payer: Self-pay

## 2020-06-28 VITALS — BP 90/60 | HR 74 | Ht 69.0 in | Wt 235.8 lb

## 2020-06-28 DIAGNOSIS — B351 Tinea unguium: Secondary | ICD-10-CM

## 2020-06-28 DIAGNOSIS — N186 End stage renal disease: Secondary | ICD-10-CM | POA: Diagnosis not present

## 2020-06-28 DIAGNOSIS — E1129 Type 2 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: Secondary | ICD-10-CM

## 2020-06-28 DIAGNOSIS — M79675 Pain in left toe(s): Secondary | ICD-10-CM | POA: Diagnosis not present

## 2020-06-28 DIAGNOSIS — M79674 Pain in right toe(s): Secondary | ICD-10-CM

## 2020-06-28 LAB — POCT GLYCOSYLATED HEMOGLOBIN (HGB A1C): Hemoglobin A1C: 12 % — AB (ref 4.0–5.6)

## 2020-06-28 MED ORDER — NOVOLIN 70/30 RELION (70-30) 100 UNIT/ML ~~LOC~~ SUSP: SUBCUTANEOUS | 3 refills | Status: DC

## 2020-06-28 MED ORDER — ONETOUCH VERIO VI STRP: 1.0000 | ORAL_STRIP | Freq: Two times a day (BID) | 12 refills | Status: DC

## 2020-06-28 MED ORDER — "INSULIN SYRINGE 31G X 5/16"" 1 ML MISC": 60.0000 [IU] | Freq: Two times a day (BID) | 3 refills | Status: AC

## 2020-06-28 NOTE — Progress Notes (Signed)
This patient presents  to my office for at risk foot care.  This patient requires this care by a professional since this patient will be at risk due to having ESRD and diabetes and coagulation defect.  This patient is taking plavix.  .  Patient had amputation of second toe left foot.  This patient is unable to cut nails himself since the patient cannot reach his nails.These nails are painful walking and wearing shoes.  This patient presents for at risk foot care today.  General Appearance  Alert, conversant and in no acute stress.  Vascular  Dorsalis pedis  pulses are  Weakly  palpable  bilaterally. Posterior tibial pulses are palpable  B/L. Capillary return is within normal limits  bilaterally. Temperature is within normal limits  bilaterally.  Neurologic  Senn-Weinstein monofilament wire test diminished/absent  bilaterally. Muscle power within normal limits bilaterally.  Nails Thick disfigured discolored nails with subungual debris  from hallux to fifth toes bilaterally except second digit left foot.. No evidence of bacterial infection or drainage bilaterally.  Orthopedic  No limitations of motion  feet .  No crepitus or effusions noted.  No bony pathology or digital deformities noted.  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   Skin  normotropic skin with no porokeratosis noted bilaterally.  No signs of infections or ulcers noted.     Onychomycosis  Pain in right toes  Pain in left toes  Consent was obtained for treatment procedures.   Mechanical debridement of nails 1-5  bilaterally performed with a nail nipper.  Filed with dremel without incident.    Return office visit    3 months                 Told patient to return for periodic foot care and evaluation due to potential at risk complications.   Gardiner Barefoot DPM

## 2020-06-28 NOTE — Progress Notes (Signed)
Subjective:    Patient ID: Darryl Kelley, male    DOB: 1965-09-25, 55 y.o.   MRN: [id number]  HPI Pt returns for f/u of diabetes mellitus: DM type: 1 Dx'ed: [id number] Complications: ESRD (on HD), toe amputation, and PN.   Therapy: insulin since 2006 DKA: once (2021) Severe hypoglycemia: last episode of was in 2015.   Pancreatitis: never Pancreatic imaging: never.   SDOH: pt lives alone, and has financial/transportation problems (so he is not a candidate for multiple daily injections).  No nearby family or friends.   Other: He eats 2 meals per day (9 AM and 6 PM); he takes multiple daily injections; he declines to add non-insulin rx.   Interval history: no cbg record, but states cbg varies from 64-500.  It is in general higher as the day goes on.  It is lowest after a missed meal.  Pt says he sometimes misses the insulin.  Past Medical History:  Diagnosis Date   Chronic kidney disease    Diabetes ([place])    Dyslipidemia    ESRD (end stage renal disease) on dialysis ([place])     No past surgical history on file.  Social History   Socioeconomic History   Marital status: Single    Spouse name: Not on file   Number of children: Not on file   Years of education: Not on file   Highest education level: Not on file  Occupational History   Not on file  Tobacco Use   Smoking status: Former    Pack years: 0.00   Smokeless tobacco: Never  Substance and Sexual Activity   Alcohol use: Never   Drug use: Never   Sexual activity: Not Currently  Other Topics Concern   Not on file  Social History Narrative   Not on file   Social Determinants of Health   Financial Resource Strain: Not on file  Food Insecurity: Not on file  Transportation Needs: Not on file  Physical Activity: Not on file  Stress: Not on file  Social Connections: Not on file  Intimate Partner Violence: Not on file    Current Outpatient Medications on File Prior to Visit  Medication Sig Dispense Refill    acetaminophen (TYLENOL) 650 MG CR tablet Take 650 mg by mouth every 8 (eight) hours as needed for pain.     amLODipine (NORVASC) 5 MG tablet TAKE 1 TABLET BY MOUTH EVERY DAY 30 tablet 8   apixaban (ELIQUIS) 5 MG TABS tablet Take 1 tablet (5 mg total) by mouth 2 (two) times daily. 60 tablet 2   apixaban (ELIQUIS) 5 MG TABS tablet Take 1 tablet (5 mg total) by mouth 2 (two) times daily. 28 tablet 0   B Complex-C-Folic Acid (RENAL) 1 MG CAPS Take 1 capsule by mouth daily.     carvedilol (COREG) 25 MG tablet Take 1 tablet (25 mg total) by mouth 2 (two) times daily with a meal. 180 tablet 3   Continuous Blood Gluc Sensor (FREESTYLE LIBRE 14 DAY SENSOR) MISC 1 each by Does not apply route every 14 (fourteen) days. For use with continuous glucose monitoring system. Change every 14 days; E11.9 6 each 3   doxycycline (DORYX) 100 MG EC tablet Take 1 tablet (100 mg total) by mouth daily. Start 2 days prior to your trip to [place] and take every day after 30 tablet 0   erythromycin ophthalmic ointment Place 1 application into both eyes 2 (two) times daily. For 5 days 3.5 g 0  ezetimibe (ZETIA) 10 MG tablet Take 10 mg by mouth daily.      famotidine (PEPCID) 20 MG tablet Take 1 tablet (20 mg total) by mouth daily. 30 tablet 1   fluticasone (FLONASE) 50 MCG/ACT nasal spray Place 2 sprays into both nostrils daily. 16 g 6   losartan (COZAAR) 50 MG tablet Take 50 mg by mouth at bedtime.     metoCLOPramide (REGLAN) 5 MG tablet TAKE 1 TABLET BY MOUTH IN THE MORNING, AT NOON, IN THE EVENING, AND AT BEDTIME. 120 tablet 1   pregabalin (LYRICA) 150 MG capsule TAKE 1 CAPSULE BY MOUTH TWICE A DAY 180 capsule 1   sevelamer carbonate (RENVELA) 800 MG tablet TAKE 2 TABLETS BY MOUTH 3 TIMES A DAY AND ONE WITH SNACK 210 tablet 1   VASCEPA 1 g capsule TAKE 2 CAPSULES BY MOUTH 2 TIMES DAILY. 120 capsule 1   No current facility-administered medications on file prior to visit.    Allergies  Allergen Reactions   Atorvastatin  Hives   Rosuvastatin Rash    Family History  Problem Relation Age of Onset   Diabetes Neg Hx     BP 90/60 (BP Location: Right Arm, Patient Position: Sitting, Cuff Size: Large)   Pulse 74   Ht '5\' 9"'$  (1.753 m)   Wt 235 lb 12.8 oz (107 kg)   SpO2 98%   BMI 34.82 kg/m    Review of Systems     Objective:   Physical Exam Pulses: dorsalis pedis intact bilat.   MSK: no deformity of the feet CV: no leg edema Skin:  no ulcer on the feet.  normal color and temp on the feet. Neuro: sensation is intact to touch on the feet  Lab Results  Component Value Date   HGBA1C 12.0 (A) 06/28/2020       Assessment & Plan:  Type 1 DM: uncontrolled.  This A1c is due to noncompliance.  Hypoglycemia, due to insulin: this limits aggressiveness of glycemic control.  The pattern of his cbg's indicates he needs some adjustment in his therapy  Patient Instructions  check your blood sugar twice a day.  vary the time of day when you check, between before the 3 meals, and at bedtime.  also check if you have symptoms of your blood sugar being too high or too low.  please keep a record of the readings and bring it to your next appointment here (or you can bring the meter itself).  You can write it on any piece of paper.  please call us sooner if your blood sugar goes below 70, or if you have a lot of readings over 200.  Please change the insulin to 90 units with breakfast, and 50 units with supper.   On this type of insulin schedule, you should eat meals on a regular schedule.  If a meal is missed or significantly delayed, your blood sugar could go low.  In particular, you should eat breakfast and take your insulin before dialysis. Please come back for a follow-up appointment in 3 months.

## 2020-06-28 NOTE — Patient Instructions (Signed)
check your blood sugar twice a day.  vary the time of day when you check, between before the 3 meals, and at bedtime.  also check if you have symptoms of your blood sugar being too high or too low.  please keep a record of the readings and bring it to your next appointment here (or you can bring the meter itself).  You can write it on any piece of paper.  please call us sooner if your blood sugar goes below 70, or if you have a lot of readings over 200.  Please change the insulin to 90 units with breakfast, and 50 units with supper.   On this type of insulin schedule, you should eat meals on a regular schedule.  If a meal is missed or significantly delayed, your blood sugar could go low.  In particular, you should eat breakfast and take your insulin before dialysis. Please come back for a follow-up appointment in 3 months.

## 2020-06-29 DIAGNOSIS — Z992 Dependence on renal dialysis: Secondary | ICD-10-CM | POA: Diagnosis not present

## 2020-06-29 DIAGNOSIS — D689 Coagulation defect, unspecified: Secondary | ICD-10-CM | POA: Diagnosis not present

## 2020-06-29 DIAGNOSIS — N186 End stage renal disease: Secondary | ICD-10-CM | POA: Diagnosis not present

## 2020-06-29 DIAGNOSIS — N2581 Secondary hyperparathyroidism of renal origin: Secondary | ICD-10-CM | POA: Diagnosis not present

## 2020-06-30 ENCOUNTER — Other Ambulatory Visit: Payer: Self-pay

## 2020-06-30 ENCOUNTER — Telehealth: Payer: Self-pay

## 2020-06-30 ENCOUNTER — Encounter: Payer: Self-pay | Admitting: Internal Medicine

## 2020-06-30 ENCOUNTER — Ambulatory Visit (INDEPENDENT_AMBULATORY_CARE_PROVIDER_SITE_OTHER): Payer: Medicare HMO | Admitting: Internal Medicine

## 2020-06-30 VITALS — BP 140/70 | HR 64 | Ht 69.5 in | Wt 233.0 lb

## 2020-06-30 DIAGNOSIS — I480 Paroxysmal atrial fibrillation: Secondary | ICD-10-CM

## 2020-06-30 DIAGNOSIS — I120 Hypertensive chronic kidney disease with stage 5 chronic kidney disease or end stage renal disease: Secondary | ICD-10-CM | POA: Diagnosis not present

## 2020-06-30 DIAGNOSIS — E785 Hyperlipidemia, unspecified: Secondary | ICD-10-CM

## 2020-06-30 DIAGNOSIS — I428 Other cardiomyopathies: Secondary | ICD-10-CM | POA: Diagnosis not present

## 2020-06-30 DIAGNOSIS — Z992 Dependence on renal dialysis: Secondary | ICD-10-CM | POA: Diagnosis not present

## 2020-06-30 DIAGNOSIS — Q203 Discordant ventriculoarterial connection: Secondary | ICD-10-CM | POA: Diagnosis not present

## 2020-06-30 DIAGNOSIS — E1322 Other specified diabetes mellitus with diabetic chronic kidney disease: Secondary | ICD-10-CM

## 2020-06-30 DIAGNOSIS — N186 End stage renal disease: Secondary | ICD-10-CM

## 2020-06-30 NOTE — Telephone Encounter (Signed)
Pt was seen in the office today and needed samples of Eliquis, pt stated that the pt assistance program has not received his application yet. I gave pt a week supply of Eliquis 5 mg tablet, until you have a chance to inquire about the application. Please address

## 2020-06-30 NOTE — Patient Instructions (Signed)
Medication Instructions:  Your physician recommends that you continue on your current medications as directed. Please refer to the Current Medication list given to you today. If you can not afford Eliquis we will switch you to coumadin which is another blood thinner.   *If you need a refill on your cardiac medications before your next appointment, please call your pharmacy*   Lab Work: NONE If you have labs (blood work) drawn today and your tests are completely normal, you will receive your results only by: [place] (if you have MyChart) OR A paper copy in the mail If you have any lab test that is abnormal or we need to change your treatment, we will call you to review the results.   Testing/Procedures: Your physician has requested that you have a limited echocardiogram. Echocardiography is a painless test that uses sound waves to create images of your heart. It provides your doctor with information about the size and shape of your heart and how well your heart's chambers and valves are working. This procedure takes approximately one hour. There are no restrictions for this procedure.    Follow-Up: At [facility], you and your health needs are our priority.  As part of our continuing mission to provide you with exceptional heart care, we have created designated Provider Care Teams.  These Care Teams include your primary Cardiologist (physician) and Advanced Practice Providers (APPs -  Physician Assistants and Nurse Practitioners) who all work together to provide you with the care you need, when you need it.  We recommend signing up for the patient portal called "MyChart".  Sign up information is provided on this After Visit Summary.  MyChart is used to connect with patients for Virtual Visits (Telemedicine).  Patients are able to view lab/test results, encounter notes, upcoming appointments, etc.  Non-urgent messages can be sent to your provider as well.   To learn more about what  you can do with MyChart, go to [email or web address].    Your next appointment:   5 month(s)  The format for your next appointment:   In Person  Provider:   You may see Werner Lean, MD or one of the following Advanced Practice Providers on your designated Care Team:   Melina Copa, PA-C Ermalinda Barrios, PA-C

## 2020-06-30 NOTE — Progress Notes (Signed)
Cardiology Office Note:    Date:  06/30/2020   ID:  Darryl Kelley, DOB 1965/01/26, MRN [id number]  PCP:  Gifford Shave, MD   [place] Prov[facility] Cardiologist:  Werner Lean, MD     Referring MD: Gifford Shave, MD   CC: Establish from [place], prior [facility] Hospitalization  History of Present Illness:    Darryl Kelley is a 55 y.o. 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  Patient notes that he is doing good.  Since last visit notes .Notes that because of his issues with glucose control he has had some setback to his [facility] eval.  There are no interval hospital/ED visit.    No chest pain or pressure .  No SOB/DOE and no PND/Orthopnea.  No weight gain or leg swelling.  No palpitations or syncope.  Patient is asymptomatic when in AF.  Past Medical History:  Diagnosis Date   Chronic kidney disease    Diabetes ([place])    Dyslipidemia    ESRD (end stage renal disease) on dialysis ([place])     No past surgical history on file.  Current Medications: Current Meds  Medication Sig   acetaminophen (TYLENOL) 650 MG CR tablet Take 650 mg by mouth every 8 (eight) hours as needed for pain.   amLODipine (NORVASC) 5 MG tablet TAKE 1 TABLET BY MOUTH EVERY DAY   apixaban (ELIQUIS) 5 MG TABS tablet Take 1 tablet (5 mg total) by mouth 2 (two) times daily.   B Complex-C-Folic Acid (RENAL) 1 MG CAPS Take 1 capsule by mouth daily.   carvedilol (COREG) 25 MG tablet Take 1 tablet (25 mg total) by mouth 2 (two) times daily with a meal.   Continuous Blood Gluc Sensor (FREESTYLE LIBRE 14 DAY SENSOR) MISC 1 each by Does not apply route every 14 (fourteen) days. For use with continuous glucose monitoring system. Change every 14 days; E11.9   ezetimibe (ZETIA) 10 MG  tablet Take 10 mg by mouth daily.    fluticasone (FLONASE) 50 MCG/ACT nasal spray Place 2 sprays into both nostrils daily.   glucose blood (ONETOUCH VERIO) test strip 1 each by Other route in the morning and at bedtime. And lancets 2/day   insulin NPH-regular Human (NOVOLIN 70/30 RELION) (70-30) 100 UNIT/ML injection 90 units with breakfast, and 50 units with supper, and syringes 2/day   Insulin Syringe-Needle U-100 (INSULIN SYRINGE 1CC/31GX5/16") 31G X 5/16" 1 ML MISC 60 Units by Does not apply route 2 (two) times daily.   losartan (COZAAR) 50 MG tablet Take 50 mg by mouth at bedtime.   metoCLOPramide (REGLAN) 5 MG tablet TAKE 1 TABLET BY MOUTH IN THE MORNING, AT NOON, IN THE EVENING, AND AT BEDTIME.   pregabalin (LYRICA) 150 MG capsule TAKE 1 CAPSULE BY MOUTH TWICE A DAY   sevelamer carbonate (RENVELA) 800 MG tablet TAKE 2 TABLETS BY MOUTH 3 TIMES A DAY AND ONE WITH SNACK   VASCEPA 1 g capsule TAKE 2 CAPSULES BY MOUTH 2 TIMES DAILY.     Allergies:   Atorvastatin and Rosuvastatin   Social History   Socioeconomic History   Marital status: Single    Spouse name: Not on file   Number of children: Not on file   Years of education: Not on file  Highest education level: Not on file  Occupational History   Not on file  Tobacco Use   Smoking status: Former    Pack years: 0.00   Smokeless tobacco: Never  Substance and Sexual Activity   Alcohol use: Never   Drug use: Never   Sexual activity: Not Currently  Other Topics Concern   Not on file  Social History Narrative   Not on file   Social Determinants of Health   Financial Resource Strain: Not on file  Food Insecurity: Not on file  Transportation Needs: Not on file  Physical Activity: Not on file  Stress: Not on file  Social Connections: Not on file    Social:  From [place]; has met Runner, broadcasting/film/video and Actor.  Family History: The patient's family history is negative for Diabetes.  ROS:   Please see the history of present  illness.     All other systems reviewed and are negative.  EKGs/Labs/Other Studies Reviewed:    The following studies were reviewed today:  EKG:   04/07/20: SR 82 Non specific TWI  Transthoracic Echocardiogram: Date: 03/18/20 Results:  1. There is an LVOT gradient of 53 mm Hg which increaed to 58 mmHg with  Valsalva.      . Left ventricular ejection fraction, by estimation, is 70 to 75%. The  left ventricle has hyperdynamic function. The left ventricle has no  regional wall motion abnormalities. There is moderate left ventricular  hypertrophy. Left ventricular diastolic  parameters are consistent with Grade I diastolic dysfunction (impaired  relaxation).   2. Right ventricular systolic function is normal. The right ventricular  size is normal.   3. The mitral valve is normal in structure. No evidence of mitral valve  regurgitation. No evidence of mitral stenosis.   4. The aortic valve is normal in structure. Aortic valve regurgitation is  not visualized. No aortic stenosis is present.   OSH Studies: Echocardiogram 11/05/19 ([facility]) Normal EF, mild LVH, trivial MR, trivial TR   PET Myocardial Perfusion 11/05/19 ([facility]) - Myocardial perfusion imaging is normal. No evidence of regadenoson-inducible ischemia.  - Left ventricular systolic function is normal.  Recent Labs: 03/16/2020: ALT 27 03/19/2020: Magnesium 2.3 03/20/2020: BUN 29; Creatinine, Ser 7.70; Hemoglobin 11.2; Platelets 87; Potassium 3.6; Sodium 137  Recent Lipid Panel    Component Value Date/Time   CHOL 81 (L) 04/16/2019 1512   TRIG 294 (H) 04/16/2019 1512   HDL 17 (L) 04/16/2019 1512   CHOLHDL 4.8 04/16/2019 1512   LDLCALC 21 04/16/2019 1512     Risk Assessment/Calculations:    CHA2DS2-VASc Score = 2  This indicates a 2.2% annual risk of stroke. The patient's score is based upon: CHF History: No HTN History: Yes Diabetes History: Yes Stroke History: No Vascular Disease History: No Age Score: 0 Gender  Score: 0          Physical Exam:    VS:  BP 140/70   Pulse 64   Ht 5' 9.5" (1.765 m)   Wt 105.7 kg   SpO2 96%   BMI 33.91 kg/m     Wt Readings from Last 3 Encounters:  06/30/20 105.7 kg  06/28/20 107 kg  06/16/20 105.1 kg     GEN:  Well nourished, well developed in no acute distress HEENT: Normal NECK: No JVD LYMPHATICS: No lymphadenopathy CARDIAC: RRR, no murmurs, rubs, gallops; good thrill and bruit from left arm AV fistuala RESPIRATORY:  Clear to auscultation without rales, wheezing or rhonchi  ABDOMEN: Soft, non-tender, non-distended  MUSCULOSKELETAL:  No edema; No deformity  SKIN: Warm and dry NEUROLOGIC:  Alert and oriented x 3 PSYCHIATRIC:  Normal affect   ASSESSMENT:    1. Obstructive cardiomyopathy ([place])   2. PAF (paroxysmal atrial fibrillation) ([place])   3. 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 HTN w DM HLD ESRD - CHADSVASC 2 (HTN and DM); patient is working through patient assistance program for Eliquis; we will do our best to assist in this regard - if unable to do DOAC; coumadin would be our next option - We discussed ~ $50 USD cost if he runs out when traveling - presently in [place]; on Eliquis and Coreg and norvasc - nephrology has patient on losartan 50 mg PO Daily which is reasonable - continue Zetia and Vascepa (this medication may be harder to get in some counties) - will get limited echo to evaluate LVOTO out of his hyperdynamic state   Hx of endocarditis -  Augmentin 2 g PO PRN Dental work or equivalent   Patient is unsure if he will stay in [place] long term; planning 11/22 f/u  Time Spent Directly with Patient:   I have spent a total of 40 minutes with the patient reviewing notes, imaging, EKGs, labs and examining the patient as well as establishing an assessment and plan that was discussed personally with the patient.  >  50% of time was spent in direct patient care and planning around patients potential travel.  Medication Adjustments/Labs and Tests Ordered: Current medicines are reviewed at length with the patient today.  Concerns regarding medicines are outlined above.  Orders Placed This Encounter  Procedures   ECHOCARDIOGRAM LIMITED    No orders of the defined types were placed in this encounter.   Patient Instructions  Medication Instructions:  Your physician recommends that you continue on your current medications as directed. Please refer to the Current Medication list given to you today. If you can not afford Eliquis we will switch you to coumadin which is another blood thinner.   *If you need a refill on your cardiac medications before your next appointment, please call your pharmacy*   Lab Work: NONE If you have labs (blood work) drawn today and your tests are completely normal, you will receive your results only by: [place] (if you have MyChart) OR A paper copy in the mail If you have any lab test that is abnormal or we need to change your treatment, we will call you to review the results.   Testing/Procedures: Your physician has requested that you have a limited echocardiogram. Echocardiography is a painless test that uses sound waves to create images of your heart. It provides your doctor with information about the size and shape of your heart and how well your heart's chambers and valves are working. This procedure takes approximately one hour. There are no restrictions for this procedure.    Follow-Up: At [facility], you and your health needs are our priority.  As part of our continuing mission to provide you with exceptional heart care, we have created designated Provider Care Teams.  These Care Teams include your primary Cardiologist (physician) and Advanced Practice Providers (APPs -  Physician Assistants and Nurse Practitioners) who all work together to provide you with  the care you need, when you need it.  We recommend signing up for the patient portal called "MyChart".  Sign up  information is provided on this After Visit Summary.  MyChart is used to connect with patients for Virtual Visits (Telemedicine).  Patients are able to view lab/test results, encounter notes, upcoming appointments, etc.  Non-urgent messages can be sent to your provider as well.   To learn more about what you can do with MyChart, go to [email or web address].    Your next appointment:   5 month(s)  The format for your next appointment:   In Person  Provider:   You may see Werner Lean, MD or one of the following Advanced Practice Providers on your designated Care Team:   Melina Copa, PA-C Ermalinda Barrios, PA-C      Signed, Werner Lean, MD  06/30/2020 9:44 AM    [place]

## 2020-07-01 DIAGNOSIS — I150 Renovascular hypertension: Secondary | ICD-10-CM | POA: Diagnosis not present

## 2020-07-01 DIAGNOSIS — N186 End stage renal disease: Secondary | ICD-10-CM | POA: Diagnosis not present

## 2020-07-01 DIAGNOSIS — N2581 Secondary hyperparathyroidism of renal origin: Secondary | ICD-10-CM | POA: Diagnosis not present

## 2020-07-01 DIAGNOSIS — D689 Coagulation defect, unspecified: Secondary | ICD-10-CM | POA: Diagnosis not present

## 2020-07-01 DIAGNOSIS — Z992 Dependence on renal dialysis: Secondary | ICD-10-CM | POA: Diagnosis not present

## 2020-07-01 NOTE — Telephone Encounter (Signed)
Patient assistance forms faxed to [facility] again.

## 2020-07-03 DIAGNOSIS — D689 Coagulation defect, unspecified: Secondary | ICD-10-CM | POA: Diagnosis not present

## 2020-07-03 DIAGNOSIS — N2581 Secondary hyperparathyroidism of renal origin: Secondary | ICD-10-CM | POA: Diagnosis not present

## 2020-07-03 DIAGNOSIS — Z992 Dependence on renal dialysis: Secondary | ICD-10-CM | POA: Diagnosis not present

## 2020-07-03 DIAGNOSIS — N186 End stage renal disease: Secondary | ICD-10-CM | POA: Diagnosis not present

## 2020-07-06 DIAGNOSIS — Z992 Dependence on renal dialysis: Secondary | ICD-10-CM | POA: Diagnosis not present

## 2020-07-06 DIAGNOSIS — N186 End stage renal disease: Secondary | ICD-10-CM | POA: Diagnosis not present

## 2020-07-06 DIAGNOSIS — N2581 Secondary hyperparathyroidism of renal origin: Secondary | ICD-10-CM | POA: Diagnosis not present

## 2020-07-06 DIAGNOSIS — D689 Coagulation defect, unspecified: Secondary | ICD-10-CM | POA: Diagnosis not present

## 2020-07-06 NOTE — Telephone Encounter (Signed)
Received fax from [place] stating that pt has been approved for Eliquis free of charge from 07/06/2020 through 01/01/2021.   Application Case #: [id number]

## 2020-07-06 NOTE — Telephone Encounter (Signed)
Faxed failed. Refaxed today 7/5

## 2020-07-06 NOTE — Telephone Encounter (Signed)
Called pt and made him aware. He was very grateful for the help. Gave him BMS phone # incase he hasnt heard from them about shipment. Also gave him the case #

## 2020-07-08 DIAGNOSIS — N2581 Secondary hyperparathyroidism of renal origin: Secondary | ICD-10-CM | POA: Diagnosis not present

## 2020-07-08 DIAGNOSIS — Z992 Dependence on renal dialysis: Secondary | ICD-10-CM | POA: Diagnosis not present

## 2020-07-08 DIAGNOSIS — N186 End stage renal disease: Secondary | ICD-10-CM | POA: Diagnosis not present

## 2020-07-08 DIAGNOSIS — D689 Coagulation defect, unspecified: Secondary | ICD-10-CM | POA: Diagnosis not present

## 2020-07-11 ENCOUNTER — Other Ambulatory Visit: Payer: Self-pay | Admitting: Physician Assistant

## 2020-07-12 DIAGNOSIS — R519 Headache, unspecified: Secondary | ICD-10-CM | POA: Diagnosis not present

## 2020-07-12 DIAGNOSIS — E1129 Type 2 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: Secondary | ICD-10-CM | POA: Diagnosis not present

## 2020-07-12 DIAGNOSIS — D689 Coagulation defect, unspecified: Secondary | ICD-10-CM | POA: Diagnosis not present

## 2020-07-12 DIAGNOSIS — N186 End stage renal disease: Secondary | ICD-10-CM | POA: Diagnosis not present

## 2020-07-12 DIAGNOSIS — Z992 Dependence on renal dialysis: Secondary | ICD-10-CM | POA: Diagnosis not present

## 2020-07-12 DIAGNOSIS — N2581 Secondary hyperparathyroidism of renal origin: Secondary | ICD-10-CM | POA: Diagnosis not present

## 2020-07-13 DIAGNOSIS — Z992 Dependence on renal dialysis: Secondary | ICD-10-CM | POA: Diagnosis not present

## 2020-07-13 DIAGNOSIS — N2581 Secondary hyperparathyroidism of renal origin: Secondary | ICD-10-CM | POA: Diagnosis not present

## 2020-07-13 DIAGNOSIS — N186 End stage renal disease: Secondary | ICD-10-CM | POA: Diagnosis not present

## 2020-07-13 DIAGNOSIS — R519 Headache, unspecified: Secondary | ICD-10-CM | POA: Diagnosis not present

## 2020-07-13 DIAGNOSIS — E1129 Type 2 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: Secondary | ICD-10-CM | POA: Diagnosis not present

## 2020-07-13 DIAGNOSIS — D689 Coagulation defect, unspecified: Secondary | ICD-10-CM | POA: Diagnosis not present

## 2020-07-15 DIAGNOSIS — Z992 Dependence on renal dialysis: Secondary | ICD-10-CM | POA: Diagnosis not present

## 2020-07-15 DIAGNOSIS — N186 End stage renal disease: Secondary | ICD-10-CM | POA: Diagnosis not present

## 2020-07-15 DIAGNOSIS — N2581 Secondary hyperparathyroidism of renal origin: Secondary | ICD-10-CM | POA: Diagnosis not present

## 2020-07-15 DIAGNOSIS — R519 Headache, unspecified: Secondary | ICD-10-CM | POA: Diagnosis not present

## 2020-07-15 DIAGNOSIS — D689 Coagulation defect, unspecified: Secondary | ICD-10-CM | POA: Diagnosis not present

## 2020-07-15 DIAGNOSIS — E1129 Type 2 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: Secondary | ICD-10-CM | POA: Diagnosis not present

## 2020-07-16 ENCOUNTER — Ambulatory Visit: Payer: Medicare HMO

## 2020-07-16 DIAGNOSIS — Z992 Dependence on renal dialysis: Secondary | ICD-10-CM | POA: Diagnosis not present

## 2020-07-16 DIAGNOSIS — I871 Compression of vein: Secondary | ICD-10-CM | POA: Diagnosis not present

## 2020-07-16 DIAGNOSIS — N186 End stage renal disease: Secondary | ICD-10-CM | POA: Diagnosis not present

## 2020-07-16 DIAGNOSIS — T82858A Stenosis of vascular prosthetic devices, implants and grafts, initial encounter: Secondary | ICD-10-CM | POA: Diagnosis not present

## 2020-07-17 DIAGNOSIS — Z992 Dependence on renal dialysis: Secondary | ICD-10-CM | POA: Diagnosis not present

## 2020-07-17 DIAGNOSIS — N186 End stage renal disease: Secondary | ICD-10-CM | POA: Diagnosis not present

## 2020-07-17 DIAGNOSIS — E1129 Type 2 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: Secondary | ICD-10-CM | POA: Diagnosis not present

## 2020-07-17 DIAGNOSIS — D689 Coagulation defect, unspecified: Secondary | ICD-10-CM | POA: Diagnosis not present

## 2020-07-17 DIAGNOSIS — R519 Headache, unspecified: Secondary | ICD-10-CM | POA: Diagnosis not present

## 2020-07-17 DIAGNOSIS — N2581 Secondary hyperparathyroidism of renal origin: Secondary | ICD-10-CM | POA: Diagnosis not present

## 2020-07-19 ENCOUNTER — Other Ambulatory Visit: Payer: Self-pay

## 2020-07-19 ENCOUNTER — Ambulatory Visit (INDEPENDENT_AMBULATORY_CARE_PROVIDER_SITE_OTHER): Payer: Medicare HMO

## 2020-07-19 DIAGNOSIS — Z23 Encounter for immunization: Secondary | ICD-10-CM

## 2020-07-20 DIAGNOSIS — D689 Coagulation defect, unspecified: Secondary | ICD-10-CM | POA: Diagnosis not present

## 2020-07-20 DIAGNOSIS — N2581 Secondary hyperparathyroidism of renal origin: Secondary | ICD-10-CM | POA: Diagnosis not present

## 2020-07-20 DIAGNOSIS — N186 End stage renal disease: Secondary | ICD-10-CM | POA: Diagnosis not present

## 2020-07-20 DIAGNOSIS — R52 Pain, unspecified: Secondary | ICD-10-CM | POA: Diagnosis not present

## 2020-07-20 DIAGNOSIS — Z992 Dependence on renal dialysis: Secondary | ICD-10-CM | POA: Diagnosis not present

## 2020-07-22 ENCOUNTER — Telehealth: Payer: Self-pay | Admitting: *Deleted

## 2020-07-22 DIAGNOSIS — R52 Pain, unspecified: Secondary | ICD-10-CM | POA: Diagnosis not present

## 2020-07-22 DIAGNOSIS — Z992 Dependence on renal dialysis: Secondary | ICD-10-CM | POA: Diagnosis not present

## 2020-07-22 DIAGNOSIS — N2581 Secondary hyperparathyroidism of renal origin: Secondary | ICD-10-CM | POA: Diagnosis not present

## 2020-07-22 DIAGNOSIS — D689 Coagulation defect, unspecified: Secondary | ICD-10-CM | POA: Diagnosis not present

## 2020-07-22 DIAGNOSIS — N186 End stage renal disease: Secondary | ICD-10-CM | POA: Diagnosis not present

## 2020-07-22 NOTE — Telephone Encounter (Signed)
Our office received a clearance request form for pt. I was needing to reach the DDS office to clarify actual procedure to be done at this time , as we do not provide a blanket clearance. There was no phone # on the fax to reach the DDS office. I goggled Dr. Darel Hong, DDS and called [phone]. I s/w Claiborne Billings in the [employer]. She states the pt is seen in their [employer] office. I told her that I did not see a number on the fax that came to our office. She gave me ph# [phone]. Claiborne Billings stated to me to please let their office know there are no numbers on the clearance request as she received 3 calls yesterday of the same nature. I then called and left message for the [facility] office for Dr. Darel Hong, DDS. Left message need a new clearance request faxed to our office [phone] ATTN: PRE OP TEAM, with clarification as to the procedure being done at this time for the pt as we cannot provide a blanket clearance, if multiple Tx plan; our office will need a clearance request sent over for each Tx plan at that time. Also, I see that extractions are checked off on the request as well. We will need to know how many teeth are planned for extraction. I left message if a new fax could please be sent to our office with clarified information as well as ph and fax #. Our office will await until we receive a new clearance request.

## 2020-07-23 MED ORDER — AMOXICILLIN 500 MG PO TABS: ORAL_TABLET | ORAL | 1 refills | Status: AC

## 2020-07-23 NOTE — Telephone Encounter (Signed)
   [facility] Pre-operative Risk Assessment    Patient Name: Deshawn Trinh Sanjose  DOB: 12/14/1965 MRN: [id number]  HEARTCARE STAFF:  - IMPORTANT!!!!!! Under Visit Info/Reason for Call, type in Other and utilize the format Clearance MM/DD/YY or Clearance TBD. Do not use dashes or single digits. - Please review there is not already an duplicate clearance open for this procedure. - If request is for dental extraction, please clarify the # of teeth to be extracted. - If the patient is currently at the dentist's office, call Pre-Op Callback Staff (MA/nurse) to input urgent request.  - If the patient is not currently in the dentist office, please route to the Pre-Op pool.  Request for surgical clearance:  What type of surgery is being performed?  2 EXTRACTIONS  When is this surgery scheduled?  07/28/2020  What type of clearance is required (medical clearance vs. Pharmacy clearance to hold med vs. Both)?  BOTH  Are there any medications that need to be held prior to surgery and how long?  [place] name and name of physician performing surgery?  [facility]  What is the office phone number?  [phone]   7.   What is the office fax number?  [phone]   8.   Anesthesia type (None, local, MAC, general) ?  LOCAL   Jeanann Lewandowsky 07/23/2020, 12:13 PM  _________________________________________________________________   (provider comments below)

## 2020-07-23 NOTE — Telephone Encounter (Signed)
Patient with diagnosis of afib on Eliquis for anticoagulation.    Procedure: 2 teeth extractions Date of procedure: 07/28/20  CHA2DS2-VASc Score = 2  This indicates a 2.2% annual risk of stroke. The patient's score is based upon: CHF History: No HTN History: Yes Diabetes History: Yes Stroke History: No Vascular Disease History: No Age Score: 0 Gender Score: 0      Pt is ESRD on HD  Patient DOES require pre-op antibiotics for dental procedure. I have sent an Rx for amoxicillin 2g prior to dental procedure to pharmacy.  Per protocol, there is no need to hold anticoagulation for extractions of 2 or less teeth. However is dentist wants patient off anticoagulation, then patient may hold Eliquis 1 day prior to procedure.

## 2020-07-23 NOTE — Telephone Encounter (Signed)
   Patient Name: Darryl Kelley  DOB: Jun 06, 1965 MRN: [id number]  Primary Cardiologist: Werner Lean, MD  Chart reviewed as part of pre-operative protocol coverage.   Will route to pharmacy regarding Eliquis hold for 2 teeth extractions on 7/27.    Arvil Chaco, PA-C 07/23/2020, 4:32 PM

## 2020-07-24 DIAGNOSIS — Z992 Dependence on renal dialysis: Secondary | ICD-10-CM | POA: Diagnosis not present

## 2020-07-24 DIAGNOSIS — N186 End stage renal disease: Secondary | ICD-10-CM | POA: Diagnosis not present

## 2020-07-24 DIAGNOSIS — D689 Coagulation defect, unspecified: Secondary | ICD-10-CM | POA: Diagnosis not present

## 2020-07-24 DIAGNOSIS — R52 Pain, unspecified: Secondary | ICD-10-CM | POA: Diagnosis not present

## 2020-07-24 DIAGNOSIS — N2581 Secondary hyperparathyroidism of renal origin: Secondary | ICD-10-CM | POA: Diagnosis not present

## 2020-07-26 ENCOUNTER — Ambulatory Visit (HOSPITAL_COMMUNITY): Payer: Medicare HMO | Attending: Internal Medicine

## 2020-07-26 ENCOUNTER — Other Ambulatory Visit: Payer: Self-pay

## 2020-07-26 DIAGNOSIS — I428 Other cardiomyopathies: Secondary | ICD-10-CM | POA: Diagnosis not present

## 2020-07-26 LAB — ECHOCARDIOGRAM LIMITED
Area-P 1/2: 3.08 cm2
S' Lateral: 2 cm

## 2020-07-26 MED ORDER — PERFLUTREN LIPID MICROSPHERE
1.0000 mL | INTRAVENOUS | Status: AC | PRN
Start: 2020-07-26 — End: 2020-07-26
  Administered 2020-07-26: 3 mL via INTRAVENOUS

## 2020-07-26 NOTE — Telephone Encounter (Signed)
   Primary Cardiologist: Werner Lean, MD  Chart reviewed as part of pre-operative protocol coverage. Given past medical history and time since last visit, based on ACC/AHA guidelines, Tai Abdur-Raheem would be at acceptable risk for the planned procedure without further cardiovascular testing.   Patient with diagnosis of afib on Eliquis for anticoagulation.     Procedure: 2 teeth extractions Date of procedure: 07/28/20   CHA2DS2-VASc Score = 2  This indicates a 2.2% annual risk of stroke. The patient's score is based upon: CHF History: No HTN History: Yes Diabetes History: Yes Stroke History: No Vascular Disease History: No Age Score: 0 Gender Score: 0       Pt is ESRD on HD   Patient DOES require pre-op antibiotics for dental procedure. I have sent an Rx for amoxicillin 2g prior to dental procedure to pharmacy.   Per protocol, there is no need to hold anticoagulation for extractions of 2 or less teeth. However is dentist wants patient off anticoagulation, then patient may hold Eliquis 1 day prior to procedure.  I will route this recommendation to the requesting party via Epic fax function and remove from pre-op pool.  Please call with questions.  Jossie Ng. Avis Mcmahill NP-C    07/26/2020, 10:41 AM [address] Office [phone] Fax [phone]

## 2020-07-27 DIAGNOSIS — R52 Pain, unspecified: Secondary | ICD-10-CM | POA: Diagnosis not present

## 2020-07-27 DIAGNOSIS — D689 Coagulation defect, unspecified: Secondary | ICD-10-CM | POA: Diagnosis not present

## 2020-07-27 DIAGNOSIS — N186 End stage renal disease: Secondary | ICD-10-CM | POA: Diagnosis not present

## 2020-07-27 DIAGNOSIS — Z992 Dependence on renal dialysis: Secondary | ICD-10-CM | POA: Diagnosis not present

## 2020-07-27 DIAGNOSIS — N2581 Secondary hyperparathyroidism of renal origin: Secondary | ICD-10-CM | POA: Diagnosis not present

## 2020-07-29 DIAGNOSIS — N2581 Secondary hyperparathyroidism of renal origin: Secondary | ICD-10-CM | POA: Diagnosis not present

## 2020-07-29 DIAGNOSIS — N186 End stage renal disease: Secondary | ICD-10-CM | POA: Diagnosis not present

## 2020-07-29 DIAGNOSIS — D689 Coagulation defect, unspecified: Secondary | ICD-10-CM | POA: Diagnosis not present

## 2020-07-29 DIAGNOSIS — R52 Pain, unspecified: Secondary | ICD-10-CM | POA: Diagnosis not present

## 2020-07-29 DIAGNOSIS — Z992 Dependence on renal dialysis: Secondary | ICD-10-CM | POA: Diagnosis not present

## 2020-07-31 DIAGNOSIS — D689 Coagulation defect, unspecified: Secondary | ICD-10-CM | POA: Diagnosis not present

## 2020-07-31 DIAGNOSIS — N186 End stage renal disease: Secondary | ICD-10-CM | POA: Diagnosis not present

## 2020-07-31 DIAGNOSIS — R52 Pain, unspecified: Secondary | ICD-10-CM | POA: Diagnosis not present

## 2020-07-31 DIAGNOSIS — Z992 Dependence on renal dialysis: Secondary | ICD-10-CM | POA: Diagnosis not present

## 2020-07-31 DIAGNOSIS — N2581 Secondary hyperparathyroidism of renal origin: Secondary | ICD-10-CM | POA: Diagnosis not present

## 2020-08-01 DIAGNOSIS — N186 End stage renal disease: Secondary | ICD-10-CM | POA: Diagnosis not present

## 2020-08-01 DIAGNOSIS — Z992 Dependence on renal dialysis: Secondary | ICD-10-CM | POA: Diagnosis not present

## 2020-08-01 DIAGNOSIS — I150 Renovascular hypertension: Secondary | ICD-10-CM | POA: Diagnosis not present

## 2020-08-03 DIAGNOSIS — Z992 Dependence on renal dialysis: Secondary | ICD-10-CM | POA: Diagnosis not present

## 2020-08-03 DIAGNOSIS — D689 Coagulation defect, unspecified: Secondary | ICD-10-CM | POA: Diagnosis not present

## 2020-08-03 DIAGNOSIS — N2581 Secondary hyperparathyroidism of renal origin: Secondary | ICD-10-CM | POA: Diagnosis not present

## 2020-08-03 DIAGNOSIS — N186 End stage renal disease: Secondary | ICD-10-CM | POA: Diagnosis not present

## 2020-08-03 DIAGNOSIS — R52 Pain, unspecified: Secondary | ICD-10-CM | POA: Diagnosis not present

## 2020-08-05 DIAGNOSIS — N2581 Secondary hyperparathyroidism of renal origin: Secondary | ICD-10-CM | POA: Diagnosis not present

## 2020-08-05 DIAGNOSIS — Z992 Dependence on renal dialysis: Secondary | ICD-10-CM | POA: Diagnosis not present

## 2020-08-05 DIAGNOSIS — R52 Pain, unspecified: Secondary | ICD-10-CM | POA: Diagnosis not present

## 2020-08-05 DIAGNOSIS — D689 Coagulation defect, unspecified: Secondary | ICD-10-CM | POA: Diagnosis not present

## 2020-08-05 DIAGNOSIS — N186 End stage renal disease: Secondary | ICD-10-CM | POA: Diagnosis not present

## 2020-08-07 DIAGNOSIS — D689 Coagulation defect, unspecified: Secondary | ICD-10-CM | POA: Diagnosis not present

## 2020-08-07 DIAGNOSIS — N186 End stage renal disease: Secondary | ICD-10-CM | POA: Diagnosis not present

## 2020-08-07 DIAGNOSIS — Z992 Dependence on renal dialysis: Secondary | ICD-10-CM | POA: Diagnosis not present

## 2020-08-07 DIAGNOSIS — R52 Pain, unspecified: Secondary | ICD-10-CM | POA: Diagnosis not present

## 2020-08-07 DIAGNOSIS — N2581 Secondary hyperparathyroidism of renal origin: Secondary | ICD-10-CM | POA: Diagnosis not present

## 2020-08-10 DIAGNOSIS — N2581 Secondary hyperparathyroidism of renal origin: Secondary | ICD-10-CM | POA: Diagnosis not present

## 2020-08-10 DIAGNOSIS — N186 End stage renal disease: Secondary | ICD-10-CM | POA: Diagnosis not present

## 2020-08-10 DIAGNOSIS — R52 Pain, unspecified: Secondary | ICD-10-CM | POA: Diagnosis not present

## 2020-08-10 DIAGNOSIS — Z992 Dependence on renal dialysis: Secondary | ICD-10-CM | POA: Diagnosis not present

## 2020-08-10 DIAGNOSIS — R519 Headache, unspecified: Secondary | ICD-10-CM | POA: Diagnosis not present

## 2020-08-10 DIAGNOSIS — D689 Coagulation defect, unspecified: Secondary | ICD-10-CM | POA: Diagnosis not present

## 2020-08-12 DIAGNOSIS — Z992 Dependence on renal dialysis: Secondary | ICD-10-CM | POA: Diagnosis not present

## 2020-08-12 DIAGNOSIS — N186 End stage renal disease: Secondary | ICD-10-CM | POA: Diagnosis not present

## 2020-08-12 DIAGNOSIS — D689 Coagulation defect, unspecified: Secondary | ICD-10-CM | POA: Diagnosis not present

## 2020-08-12 DIAGNOSIS — R519 Headache, unspecified: Secondary | ICD-10-CM | POA: Diagnosis not present

## 2020-08-12 DIAGNOSIS — R52 Pain, unspecified: Secondary | ICD-10-CM | POA: Diagnosis not present

## 2020-08-12 DIAGNOSIS — N2581 Secondary hyperparathyroidism of renal origin: Secondary | ICD-10-CM | POA: Diagnosis not present

## 2020-08-14 DIAGNOSIS — Z992 Dependence on renal dialysis: Secondary | ICD-10-CM | POA: Diagnosis not present

## 2020-08-14 DIAGNOSIS — R52 Pain, unspecified: Secondary | ICD-10-CM | POA: Diagnosis not present

## 2020-08-14 DIAGNOSIS — R519 Headache, unspecified: Secondary | ICD-10-CM | POA: Diagnosis not present

## 2020-08-14 DIAGNOSIS — N2581 Secondary hyperparathyroidism of renal origin: Secondary | ICD-10-CM | POA: Diagnosis not present

## 2020-08-14 DIAGNOSIS — D689 Coagulation defect, unspecified: Secondary | ICD-10-CM | POA: Diagnosis not present

## 2020-08-14 DIAGNOSIS — N186 End stage renal disease: Secondary | ICD-10-CM | POA: Diagnosis not present

## 2020-08-17 DIAGNOSIS — N2581 Secondary hyperparathyroidism of renal origin: Secondary | ICD-10-CM | POA: Diagnosis not present

## 2020-08-17 DIAGNOSIS — N186 End stage renal disease: Secondary | ICD-10-CM | POA: Diagnosis not present

## 2020-08-17 DIAGNOSIS — D689 Coagulation defect, unspecified: Secondary | ICD-10-CM | POA: Diagnosis not present

## 2020-08-17 DIAGNOSIS — Z992 Dependence on renal dialysis: Secondary | ICD-10-CM | POA: Diagnosis not present

## 2020-08-19 ENCOUNTER — Emergency Department (HOSPITAL_COMMUNITY)
Admission: EM | Admit: 2020-08-19 | Discharge: 2020-08-19 | Disposition: A | Discharge status: Home / Self Care | Payer: Medicare HMO | Attending: Emergency Medicine | Admitting: Emergency Medicine

## 2020-08-19 ENCOUNTER — Other Ambulatory Visit: Payer: Self-pay

## 2020-08-19 ENCOUNTER — Emergency Department (HOSPITAL_COMMUNITY): Payer: Medicare HMO

## 2020-08-19 ENCOUNTER — Encounter (HOSPITAL_COMMUNITY): Payer: Self-pay | Admitting: Emergency Medicine

## 2020-08-19 DIAGNOSIS — E1169 Type 2 diabetes mellitus with other specified complication: Secondary | ICD-10-CM | POA: Insufficient documentation

## 2020-08-19 DIAGNOSIS — E113553 Type 2 diabetes mellitus with stable proliferative diabetic retinopathy, bilateral: Secondary | ICD-10-CM | POA: Insufficient documentation

## 2020-08-19 DIAGNOSIS — Z992 Dependence on renal dialysis: Secondary | ICD-10-CM | POA: Insufficient documentation

## 2020-08-19 DIAGNOSIS — Z7901 Long term (current) use of anticoagulants: Secondary | ICD-10-CM | POA: Diagnosis not present

## 2020-08-19 DIAGNOSIS — Z794 Long term (current) use of insulin: Secondary | ICD-10-CM | POA: Insufficient documentation

## 2020-08-19 DIAGNOSIS — I493 Ventricular premature depolarization: Secondary | ICD-10-CM

## 2020-08-19 DIAGNOSIS — N2581 Secondary hyperparathyroidism of renal origin: Secondary | ICD-10-CM | POA: Diagnosis not present

## 2020-08-19 DIAGNOSIS — E1136 Type 2 diabetes mellitus with diabetic cataract: Secondary | ICD-10-CM | POA: Diagnosis not present

## 2020-08-19 DIAGNOSIS — E782 Mixed hyperlipidemia: Secondary | ICD-10-CM | POA: Insufficient documentation

## 2020-08-19 DIAGNOSIS — N186 End stage renal disease: Secondary | ICD-10-CM | POA: Insufficient documentation

## 2020-08-19 DIAGNOSIS — Z87891 Personal history of nicotine dependence: Secondary | ICD-10-CM | POA: Insufficient documentation

## 2020-08-19 DIAGNOSIS — I132 Hypertensive heart and chronic kidney disease with heart failure and with stage 5 chronic kidney disease, or end stage renal disease: Secondary | ICD-10-CM | POA: Insufficient documentation

## 2020-08-19 DIAGNOSIS — R079 Chest pain, unspecified: Secondary | ICD-10-CM

## 2020-08-19 DIAGNOSIS — Z79899 Other long term (current) drug therapy: Secondary | ICD-10-CM | POA: Diagnosis not present

## 2020-08-19 DIAGNOSIS — I5032 Chronic diastolic (congestive) heart failure: Secondary | ICD-10-CM | POA: Diagnosis not present

## 2020-08-19 DIAGNOSIS — Z743 Need for continuous supervision: Secondary | ICD-10-CM | POA: Diagnosis not present

## 2020-08-19 DIAGNOSIS — R0789 Other chest pain: Secondary | ICD-10-CM | POA: Diagnosis not present

## 2020-08-19 DIAGNOSIS — I517 Cardiomegaly: Secondary | ICD-10-CM | POA: Diagnosis not present

## 2020-08-19 DIAGNOSIS — D689 Coagulation defect, unspecified: Secondary | ICD-10-CM | POA: Diagnosis not present

## 2020-08-19 DIAGNOSIS — E1122 Type 2 diabetes mellitus with diabetic chronic kidney disease: Secondary | ICD-10-CM | POA: Diagnosis not present

## 2020-08-19 LAB — COMPREHENSIVE METABOLIC PANEL
ALT: 25 U/L (ref 0–44)
AST: 37 U/L (ref 15–41)
Albumin: 3.5 g/dL (ref 3.5–5.0)
Alkaline Phosphatase: 68 U/L (ref 38–126)
Anion gap: 11 (ref 5–15)
BUN: 19 mg/dL (ref 6–20)
CO2: 33 mmol/L — ABNORMAL HIGH (ref 22–32)
Calcium: 8.7 mg/dL — ABNORMAL LOW (ref 8.9–10.3)
Chloride: 92 mmol/L — ABNORMAL LOW (ref 98–111)
Creatinine, Ser: 6.65 mg/dL — ABNORMAL HIGH (ref 0.61–1.24)
GFR, Estimated: 9 mL/min — ABNORMAL LOW (ref >60)
Glucose, Bld: 159 mg/dL — ABNORMAL HIGH (ref 70–99)
Potassium: 3.8 mmol/L (ref 3.5–5.1)
Sodium: 136 mmol/L (ref 135–145)
Total Bilirubin: 0.9 mg/dL (ref 0.3–1.2)
Total Protein: 7.9 g/dL (ref 6.5–8.1)

## 2020-08-19 LAB — TROPONIN I (HIGH SENSITIVITY)
Troponin I (High Sensitivity): 10 ng/L (ref <18)
Troponin I (High Sensitivity): 10 ng/L (ref <18)

## 2020-08-19 LAB — CBC
HCT: 40.4 % (ref 39.0–52.0)
Hemoglobin: 14.1 g/dL (ref 13.0–17.0)
MCH: 31 pg (ref 26.0–34.0)
MCHC: 34.9 g/dL (ref 30.0–36.0)
MCV: 88.8 fL (ref 80.0–100.0)
Platelets: 123 10*3/uL — ABNORMAL LOW (ref 150–400)
RBC: 4.55 MIL/uL (ref 4.22–5.81)
RDW: 13 % (ref 11.5–15.5)
WBC: 4.9 10*3/uL (ref 4.0–10.5)
nRBC: 0 % (ref 0.0–0.2)

## 2020-08-19 LAB — CBG MONITORING, ED: Glucose-Capillary: 79 mg/dL (ref 70–99)

## 2020-08-19 LAB — MAGNESIUM: Magnesium: 2.5 mg/dL — ABNORMAL HIGH (ref 1.7–2.4)

## 2020-08-19 LAB — TSH: TSH: 0.889 u[IU]/mL (ref 0.350–4.500)

## 2020-08-19 NOTE — Discharge Instructions (Addendum)
You were seen at [facility] Emergency department on 8/18. We checked your blood and looked at an EKG of your heart as well as a chest XRAY. We were able to determine that you were not having a heart attack and believe that your chest pain is related to extra beats that your heart is occasionally having that we were able to observe on the monitor. Your heart was not in atrial fibrillation while you were here and your heart rate was normal. If you start to experience an increase in your chest pain that does not get better after a few seconds as it has or the pain changes and starts to feel like there is a pressure on your chest, or you have trouble breathing we recommend that you come back to the emergency department.

## 2020-08-19 NOTE — ED Provider Notes (Signed)
[facility] EMERGENCY DEPARTMENT Provider Note   CSN: [id number] Arrival date & time: 08/19/20  1607     History Chief Complaint  Patient presents with   Chest Pain    Darryl Kelley is a 55 y.o. male.   Chest Pain Associated symptoms: palpitations   Associated symptoms: no cough, no dizziness, no fever, no nausea, no shortness of breath and no vomiting    Patient is complaining of sharp stabbing chest pain that started first yesterday evening. The pain comes on suddenly at rest and lasts a quick second or two and then goes completely away. Between bouts he says he has no pain. He said it happened for ten minutes yesterday and then went away, but when it started again today after he sat up after finishing dialysis around 1:30 he became concerned and decided that he needed to come in. The pain in his arm was around his elbow area and also comes and goes. He wasn't sure if this was related to dialysis or had to do with his heart. He denies any shortness of breath, cough, fever, nausea, vomiting, or other signs of COVID. Denies any sweating, dizziness, or loss of consciousness. He is most concerned that he could be having a heart attack due to his own personal history of afib, ventricular hypertrophy, and a family history of heart attacks that lead to his brother passing away.     Past Medical History:  Diagnosis Date   Chronic kidney disease    Diabetes ([place])    Dyslipidemia    ESRD (end stage renal disease) on dialysis [facility])     Patient Active Problem List   Diagnosis Date Noted   Obstructive cardiomyopathy ([place]) 06/30/2020   Secondary diabetes mellitus with hypertension and end stage renal disease on dialysis ([place]) 06/30/2020   Conjunctivitis 06/16/2020   Travel advice encounter 06/16/2020   Corrected TGA/IVS/LVOTO-transpos, intact vent sept, LV outflo obstruct    PNA (pneumonia) 03/18/2020   Community acquired pneumonia    Hyperosmolar hyperglycemic  state (HHS) ([place]) 03/16/2020   Anesthesia of skin 08/25/2019   Moderate protein-calorie malnutrition ([place]) 06/27/2019   Pain due to onychomycosis of toenails of both feet 06/25/2019   Gout, unspecified 06/24/2019   Hypoglycemia, unspecified 06/04/2019   Proliferative diabetic retinopathy of right eye ([place]) 05/05/2019   Proliferative diabetic retinopathy of left eye ([place]) 05/05/2019   Nuclear sclerotic cataract of both eyes 04/21/2019   Encounter to establish care with new doctor 04/16/2019   Night sweats 04/16/2019   Dyslipidemia    Diarrhea, unspecified 03/25/2019   Pain, unspecified 02/15/2019   Type 2 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 ([place]) [id number]   End stage renal disease ([place]) 02/03/2019   Allergy, unspecified, initial encounter 02/03/2019   Coagulation defect, unspecified ([place]) 02/03/2019   Secondary hyperparathyroidism of renal origin ([place]) 02/03/2019   Chronic hepatitis C without hepatic coma ([place]) 09/11/2018   Hepatitis B core antibody positive 09/11/2018   Endocarditis 07/30/2018   Hypertension 07/30/2018   LVH (left ventricular hypertrophy) 07/30/2018   Nephrotic range proteinuria 07/30/2018   Worsening renal function 07/30/2018   Chronic diastolic heart failure ([place]) 07/26/2018   Hypertensive heart and renal disease 07/26/2018   Mixed hyperlipidemia 07/26/2018   PAF (paroxysmal atrial fibrillation) ([place]) 07/26/2018    History reviewed. No pertinent surgical history.     Family History  Problem Relation Age of Onset   Diabetes Neg Hx     Social History   Tobacco Use  Smoking status: Former   Smokeless tobacco: Never  Substance Use Topics   Alcohol use: Never   Drug use: Never    Home Medications Prior to Admission medications   Medication Sig Start Date End Date Taking? Authorizing Provider  acetaminophen (TYLENOL) 650 MG CR tablet Take 650 mg by mouth every 8 (eight) hours as needed for pain.    [provider]   amLODipine (NORVASC) 5 MG tablet TAKE 1 TABLET BY MOUTH EVERY DAY 03/22/20   Gifford Shave, MD  amoxicillin (AMOXIL) 500 MG tablet Take 4 tablets by mouth 1 hour prior to dental procedure. 07/23/20   Werner Lean, MD  apixaban (ELIQUIS) 5 MG TABS tablet Take 1 tablet (5 mg total) by mouth 2 (two) times daily. 05/19/20   Werner Lean, MD  B Complex-C-Folic Acid (RENAL) 1 MG CAPS Take 1 capsule by mouth daily. 09/18/19   [provider]  carvedilol (COREG) 25 MG tablet TAKE 1.5 TABLETS (37.5 MG TOTAL) BY MOUTH 2 (TWO) TIMES DAILY WITH A MEAL. 07/12/20   Chandrasekhar, Mahesh A, MD  Continuous Blood Gluc Sensor (FREESTYLE LIBRE 14 DAY SENSOR) MISC 1 each by Does not apply route every 14 (fourteen) days. For use with continuous glucose monitoring system. Change every 14 days; E11.9 11/12/19   Renato Shin, MD  ezetimibe (ZETIA) 10 MG tablet Take 10 mg by mouth daily.  02/04/19   [provider]  fluticasone (FLONASE) 50 MCG/ACT nasal spray Place 2 sprays into both nostrils daily. 04/16/19   Gifford Shave, MD  glucose blood (ONETOUCH VERIO) test strip 1 each by Other route in the morning and at bedtime. And lancets 2/day 06/28/20   Renato Shin, MD  insulin NPH-regular Human (NOVOLIN 70/30 RELION) (70-30) 100 UNIT/ML injection 90 units with breakfast, and 50 units with supper, and syringes 2/day 06/28/20   Renato Shin, MD  Insulin Syringe-Needle U-100 (INSULIN SYRINGE 1CC/31GX5/16") 31G X 5/16" 1 ML MISC 60 Units by Does not apply route 2 (two) times daily. 06/28/20   Renato Shin, MD  losartan (COZAAR) 50 MG tablet Take 50 mg by mouth at bedtime. 03/11/20   [provider]  metoCLOPramide (REGLAN) 5 MG tablet TAKE 1 TABLET BY MOUTH IN THE MORNING, AT NOON, IN THE EVENING, AND AT BEDTIME. 06/11/20   Gifford Shave, MD  pregabalin (LYRICA) 150 MG capsule TAKE 1 CAPSULE BY MOUTH TWICE A DAY 04/15/20   Gifford Shave, MD  sevelamer carbonate (RENVELA) 800 MG  tablet TAKE 2 TABLETS BY MOUTH 3 TIMES A DAY AND ONE WITH SNACK 11/14/19   Gifford Shave, MD  VASCEPA 1 g capsule TAKE 2 CAPSULES BY MOUTH 2 TIMES DAILY. 04/12/20   Gifford Shave, MD    Allergies    Atorvastatin and Rosuvastatin  Review of Systems   Review of Systems  Constitutional:  Negative for chills and fever.  HENT:  Negative for congestion and sore throat.   Respiratory:  Negative for cough and shortness of breath.   Cardiovascular:  Positive for chest pain and palpitations. Negative for leg swelling.  Gastrointestinal:  Negative for nausea and vomiting.  Neurological:  Negative for dizziness and syncope.   Physical Exam Updated Vital Signs BP (!) 151/98   Pulse 72   Temp 98.7 F (37.1 C) (Oral)   Resp 17   Ht 5' 9.5" (1.765 m)   Wt 108 kg   SpO2 96%   BMI 34.66 kg/m   Physical Exam Constitutional:      Appearance: He  is well-developed. He is obese.  HENT:     Head: Normocephalic and atraumatic.  Cardiovascular:     Rate and Rhythm: Normal rate and regular rhythm.     Heart sounds: Murmur heard.  Pulmonary:     Effort: Pulmonary effort is normal.     Breath sounds: Normal breath sounds.  Chest:     Chest wall: No tenderness.  Abdominal:     General: Bowel sounds are normal.     Palpations: Abdomen is soft.  Musculoskeletal:     Right lower leg: No edema.     Left lower leg: No edema.  Neurological:     Mental Status: He is alert.    ED Results / Procedures / Treatments   Labs (all labs ordered are listed, but only abnormal results are displayed) Labs Reviewed  MAGNESIUM - Abnormal; Notable for the following components:      Result Value   Magnesium 2.5 (*)    All other components within normal limits  CBC - Abnormal; Notable for the following components:   Platelets 123 (*)    All other components within normal limits  COMPREHENSIVE METABOLIC PANEL - Abnormal; Notable for the following components:   Chloride 92 (*)    CO2 33 (*)     Glucose, Bld 159 (*)    Creatinine, Ser 6.65 (*)    Calcium 8.7 (*)    GFR, Estimated 9 (*)    All other components within normal limits  TSH  CBG MONITORING, ED  TROPONIN I (HIGH SENSITIVITY)  TROPONIN I (HIGH SENSITIVITY)    EKG EKG Interpretation  Date/Time:  Thursday August 19 2020 16:26:42 EDT Ventricular Rate:  70 PR Interval:  150 QRS Duration: 85 QT Interval:  419 QTC Calculation: 453 R Axis:   81 Text Interpretation: Sinus rhythm When compared to prior, slower rate. No STEMI Confirmed by Antony Blackbird [phone]) on 08/19/2020 4:47:17 PM  Radiology DG Chest 2 View  Result Date: 08/19/2020 CLINICAL DATA:  Intermittent sharp chest pain. Patient comes from dialysis. Chest pain onset last night. EXAM: CHEST - 2 VIEW COMPARISON:  Radiograph 05/28/2020 FINDINGS: Upper normal heart size. Stable mediastinal contours. Vascular congestion with minimal interstitial thickening compared to prior. No confluent consolidation. No pleural fluid or pneumothorax. IMPRESSION: Vascular congestion with minimal interstitial thickening, suspicious for pulmonary edema. Borderline cardiomegaly. Electronically Signed   By: Keith Rake M.D.   On: 08/19/2020 19:34    Procedures Procedures   Medications Ordered in ED Medications - No data to display  ED Course  I have reviewed the triage vital signs and the nursing notes.  Pertinent labs & imaging results that were available during my care of the patient were reviewed by me and considered in my medical decision making (see chart for details).    MDM Rules/Calculators/A&P                           Patient coming in with intermittent episodes of sharp stabbing pain in his chest. EKG shows sinus rhythm with intermittent PVCs noted on telemetry. Patient did not have any respiratory distress or trouble breathing. Labs largely unremarkable. Suspect patient may be feeling PVCs based on the quick onset and resolution of pain. Troponin x2 were  negative, do not suspect ACS at this time.   Final Clinical Impression(s) / ED Diagnoses Final diagnoses:  Chest pain, unspecified type  PVC (premature ventricular contraction)    Rx / DC Orders ED  Discharge Orders     None        Scarlett Presto, MD 08/19/20 2340    Tegeler, Gwenyth Allegra, MD 08/20/20 Laureen Abrahams

## 2020-08-19 NOTE — Progress Notes (Signed)
Deaccessed arterial and venous fistula, held pressure 72mns per site, pt educated regarding bleeding, hemostasis achieved, gauze dressing in place.

## 2020-08-19 NOTE — ED Triage Notes (Signed)
Pt BIB [facility], coming from dialysis, received full treatment today, CP started last night while at rest. Pt describes pain as starting initially as fluttering, then went to sharp stabbing pain radiating down L arm. Pt states pain is intermittent. Hx afib, pericarditis. 12 lead unremarkable. Given 324 asa.   EMS VS- 138/92, HR 78, RR 22, SpO2 99%, CBG 132

## 2020-08-21 DIAGNOSIS — N2581 Secondary hyperparathyroidism of renal origin: Secondary | ICD-10-CM | POA: Diagnosis not present

## 2020-08-21 DIAGNOSIS — N186 End stage renal disease: Secondary | ICD-10-CM | POA: Diagnosis not present

## 2020-08-21 DIAGNOSIS — D689 Coagulation defect, unspecified: Secondary | ICD-10-CM | POA: Diagnosis not present

## 2020-08-21 DIAGNOSIS — Z992 Dependence on renal dialysis: Secondary | ICD-10-CM | POA: Diagnosis not present

## 2020-08-23 ENCOUNTER — Other Ambulatory Visit: Payer: Self-pay

## 2020-08-23 ENCOUNTER — Telehealth: Payer: Self-pay | Admitting: Endocrinology

## 2020-08-23 ENCOUNTER — Other Ambulatory Visit: Payer: Self-pay | Admitting: Family Medicine

## 2020-08-23 DIAGNOSIS — E1129 Type 2 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: Secondary | ICD-10-CM

## 2020-08-23 MED ORDER — NOVOLIN 70/30 RELION (70-30) 100 UNIT/ML ~~LOC~~ SUSP: SUBCUTANEOUS | 3 refills | Status: AC

## 2020-08-23 NOTE — Telephone Encounter (Signed)
Pt is going out of town September 3rd and is wanting to know if he can get his refill early for insulin NPH-regular Human (NOVOLIN 70/30 RELION) (70-30) 100 UNIT/ML injection  [id number]- [place], [place] - [address] AT [address]

## 2020-08-24 ENCOUNTER — Other Ambulatory Visit: Payer: Self-pay | Admitting: Family Medicine

## 2020-08-24 DIAGNOSIS — D689 Coagulation defect, unspecified: Secondary | ICD-10-CM | POA: Diagnosis not present

## 2020-08-24 DIAGNOSIS — N2581 Secondary hyperparathyroidism of renal origin: Secondary | ICD-10-CM | POA: Diagnosis not present

## 2020-08-24 DIAGNOSIS — Z992 Dependence on renal dialysis: Secondary | ICD-10-CM | POA: Diagnosis not present

## 2020-08-24 DIAGNOSIS — N186 End stage renal disease: Secondary | ICD-10-CM | POA: Diagnosis not present

## 2020-08-26 DIAGNOSIS — N186 End stage renal disease: Secondary | ICD-10-CM | POA: Diagnosis not present

## 2020-08-26 DIAGNOSIS — Z992 Dependence on renal dialysis: Secondary | ICD-10-CM | POA: Diagnosis not present

## 2020-08-26 DIAGNOSIS — N2581 Secondary hyperparathyroidism of renal origin: Secondary | ICD-10-CM | POA: Diagnosis not present

## 2020-08-26 DIAGNOSIS — D689 Coagulation defect, unspecified: Secondary | ICD-10-CM | POA: Diagnosis not present

## 2020-08-28 DIAGNOSIS — N2581 Secondary hyperparathyroidism of renal origin: Secondary | ICD-10-CM | POA: Diagnosis not present

## 2020-08-28 DIAGNOSIS — N186 End stage renal disease: Secondary | ICD-10-CM | POA: Diagnosis not present

## 2020-08-28 DIAGNOSIS — D689 Coagulation defect, unspecified: Secondary | ICD-10-CM | POA: Diagnosis not present

## 2020-08-28 DIAGNOSIS — Z992 Dependence on renal dialysis: Secondary | ICD-10-CM | POA: Diagnosis not present

## 2020-08-31 DIAGNOSIS — Z992 Dependence on renal dialysis: Secondary | ICD-10-CM | POA: Diagnosis not present

## 2020-08-31 DIAGNOSIS — D689 Coagulation defect, unspecified: Secondary | ICD-10-CM | POA: Diagnosis not present

## 2020-08-31 DIAGNOSIS — N186 End stage renal disease: Secondary | ICD-10-CM | POA: Diagnosis not present

## 2020-08-31 DIAGNOSIS — N2581 Secondary hyperparathyroidism of renal origin: Secondary | ICD-10-CM | POA: Diagnosis not present

## 2020-08-31 DIAGNOSIS — R519 Headache, unspecified: Secondary | ICD-10-CM | POA: Diagnosis not present

## 2020-09-01 DIAGNOSIS — N186 End stage renal disease: Secondary | ICD-10-CM | POA: Diagnosis not present

## 2020-09-01 DIAGNOSIS — I150 Renovascular hypertension: Secondary | ICD-10-CM | POA: Diagnosis not present

## 2020-09-01 DIAGNOSIS — Z992 Dependence on renal dialysis: Secondary | ICD-10-CM | POA: Diagnosis not present

## 2020-09-02 DIAGNOSIS — N186 End stage renal disease: Secondary | ICD-10-CM | POA: Diagnosis not present

## 2020-09-02 DIAGNOSIS — D689 Coagulation defect, unspecified: Secondary | ICD-10-CM | POA: Diagnosis not present

## 2020-09-02 DIAGNOSIS — Z992 Dependence on renal dialysis: Secondary | ICD-10-CM | POA: Diagnosis not present

## 2020-09-02 DIAGNOSIS — N2581 Secondary hyperparathyroidism of renal origin: Secondary | ICD-10-CM | POA: Diagnosis not present

## 2020-09-03 DIAGNOSIS — Z992 Dependence on renal dialysis: Secondary | ICD-10-CM | POA: Diagnosis not present

## 2020-09-03 DIAGNOSIS — N2581 Secondary hyperparathyroidism of renal origin: Secondary | ICD-10-CM | POA: Diagnosis not present

## 2020-09-03 DIAGNOSIS — N186 End stage renal disease: Secondary | ICD-10-CM | POA: Diagnosis not present

## 2020-09-03 DIAGNOSIS — D689 Coagulation defect, unspecified: Secondary | ICD-10-CM | POA: Diagnosis not present

## 2020-09-13 ENCOUNTER — Ambulatory Visit: Payer: Self-pay | Admitting: Endocrinology

## 2020-09-13 ENCOUNTER — Ambulatory Visit: Payer: Medicare HMO | Admitting: Endocrinology

## 2020-09-13 ENCOUNTER — Ambulatory Visit: Payer: Medicare HMO | Admitting: Podiatry

## 2020-10-01 DIAGNOSIS — N186 End stage renal disease: Secondary | ICD-10-CM | POA: Diagnosis not present

## 2020-10-01 DIAGNOSIS — Z992 Dependence on renal dialysis: Secondary | ICD-10-CM | POA: Diagnosis not present

## 2020-10-01 DIAGNOSIS — I150 Renovascular hypertension: Secondary | ICD-10-CM | POA: Diagnosis not present

## 2020-11-21 ENCOUNTER — Other Ambulatory Visit: Payer: Self-pay | Admitting: Endocrinology

## 2020-12-05 NOTE — Progress Notes (Deleted)
Cardiology Office Note:    Date:  12/05/2020   ID:  Darryl Kelley, DOB 03-Nov-1965, MRN [id number]  PCP:  Gifford Shave, MD   [facility] Cardiologist:  Werner Lean, MD     Referring MD: Gifford Shave, MD   CC: follow up prior to move  History of Present Illness:    Darryl Kelley is a 55 y.o. 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  In interim of this visit, patient had some dental work done.  LVOTO resolved outside of sepsis. Seen 12/06/20.  Patient notes that he is doing ***.   Since day prior/last visit notes *** . There are no*** interval hospital/ED visit.    No chest pain or pressure ***.  No SOB/DOE*** and no PND/Orthopnea***.  No weight gain or leg swelling***.  No palpitations or syncope ***.  Ambulatory blood pressure ***.   Past Medical History:  Diagnosis Date   Chronic kidney disease    Diabetes ([place])    Dyslipidemia    ESRD (end stage renal disease) on dialysis ([place])     No past surgical history on file.  Current Medications: No outpatient medications have been marked as taking for the 12/06/20 encounter (Appointment) with Werner Lean, MD.     Allergies:   Atorvastatin and Rosuvastatin   Social History   Socioeconomic History   Marital status: Single    Spouse name: Not on file   Number of children: Not on file   Years of education: Not on file   Highest education level: Not on file  Occupational History   Not on file  Tobacco Use   Smoking status: Former   Smokeless tobacco: Never  Substance and Sexual Activity   Alcohol use: Never   Drug use: Never   Sexual activity: Not Currently  Other Topics Concern   Not on file  Social History Narrative   Not on file   Social  Determinants of Health   Financial Resource Strain: Not on file  Food Insecurity: Not on file  Transportation Needs: Not on file  Physical Activity: Not on file  Stress: Not on file  Social Connections: Not on file    Social:  From [place]; has met Runner, broadcasting/film/video and Actor.  Family History: The patient's family history is negative for Diabetes.  ROS:   Please see the history of present illness.     All other systems reviewed and are negative.  EKGs/Labs/Other Studies Reviewed:    The following studies were reviewed today:  EKG:   04/07/20: SR 82 Non specific TWI  Transthoracic Echocardiogram: Date: 07/26/20 Results:  1. Left ventricular ejection fraction, by estimation, is 65 to 70%. The  left ventricle has normal function. There is mild left ventricular  hypertrophy. Left ventricular diastolic parameters are consistent with  Grade I diastolic dysfunction (impaired  relaxation). No demonstrated dynamic LVOT or mid cavitary gradient on this  exam.   2. Right ventricular systolic function is normal. The right ventricular  size is normal.   3. The mitral valve is degenerative. Trivial mitral valve regurgitation.   4. The aortic valve is tricuspid. There is mild calcification of the  aortic valve. Aortic valve regurgitation is not visualized. No aortic  stenosis is present.   OSH  Studies: Echocardiogram 11/05/19 ([facility]) Normal EF, mild LVH, trivial MR, trivial TR   PET Myocardial Perfusion 11/05/19 ([facility]) - Myocardial perfusion imaging is normal. No evidence of regadenoson-inducible ischemia.  - Left ventricular systolic function is normal.  Recent Labs: 08/19/2020: ALT 25; BUN 19; Creatinine, Ser 6.65; Hemoglobin 14.1; Magnesium 2.5; Platelets 123; Potassium 3.8; Sodium 136; TSH 0.889  Recent Lipid Panel    Component Value Date/Time   CHOL 81 (L) 04/16/2019 1512   TRIG 294 (H) 04/16/2019 1512   HDL 17 (L) 04/16/2019 1512   CHOLHDL 4.8 04/16/2019 1512   LDLCALC 21  04/16/2019 1512     Risk Assessment/Calculations:    CHA2DS2-VASc Score = 2  This indicates a 2.2% annual risk of stroke. The patient's score is based upon: CHF History: 0 HTN History: 1 Diabetes History: 1 Stroke History: 0 Vascular Disease History: 0 Age Score: 0 Gender Score: 0   {This patient has a significant risk of stroke if diagnosed with atrial fibrillation.  Please consider VKA or DOAC agent for anticoagulation if the bleeding risk is acceptable.   You can also use the SmartPhrase .[place] for documentation.   :[id number]}       Physical Exam:    VS:  There were no vitals taken for this visit.    Wt Readings from Last 3 Encounters:  08/19/20 238 lb 1.6 oz (108 kg)  06/30/20 233 lb (105.7 kg)  06/28/20 235 lb 12.8 oz (107 kg)     Gen: *** distress, *** obese/well nourished/malnourished   Neck: No JVD, *** carotid bruit Ears: *** Frank Sign Cardiac: No Rubs or Gallops, *** Murmur, ***cardia, *** radial pulses Respiratory: Clear to auscultation bilaterally, *** effort, ***  respiratory rate GI: Soft, nontender, non-distended *** MS: No *** edema; *** moves all extremities Integument: Skin feels *** Neuro:  At time of evaluation, alert and oriented to person/place/time/situation *** Psych: Normal affect, patient feels ***   ASSESSMENT:    No diagnosis found.  PLAN:    PAF LVOT Obstruction- phenotypically related to prolonged HTN w DM HLD ESRD - CHADSVASC 2 (HTN and DM); patient is working through patient assistance program for Eliquis; we will do our best to assist in this regard - if unable to do DOAC; coumadin would be our next option - We discussed ~ $50 USD cost if he runs out when traveling - presently in [place]; on Eliquis and Coreg and norvasc - nephrology has patient on losartan 50 mg PO Daily  - continue Zetia and Vascepa (this medication may be harder to get in some counties) - if [place] eval ***    Hx  of endocarditis -  Augmentin 2 g PO PRN Dental work or equivalent   Patient is unsure if he will stay in [place] long term; planning 11/22 f/u   Medication Adjustments/Labs and Tests Ordered: Current medicines are reviewed at length with the patient today.  Concerns regarding medicines are outlined above.  No orders of the defined types were placed in this encounter.   No orders of the defined types were placed in this encounter.   There are no Patient Instructions on file for this visit.   Signed, Werner Lean, MD  12/05/2020 2:51 PM    [facility]

## 2020-12-06 ENCOUNTER — Ambulatory Visit: Payer: Medicare HMO | Admitting: Internal Medicine

## 2020-12-29 DIAGNOSIS — R000 Tachycardia, unspecified: Secondary | ICD-10-CM | POA: Diagnosis not present

## 2020-12-30 DIAGNOSIS — R933 Abnormal findings on diagnostic imaging of other parts of digestive tract: Secondary | ICD-10-CM | POA: Diagnosis not present

## 2020-12-30 DIAGNOSIS — E1122 Type 2 diabetes mellitus with diabetic chronic kidney disease: Secondary | ICD-10-CM | POA: Diagnosis not present

## 2020-12-30 DIAGNOSIS — U071 COVID-19: Secondary | ICD-10-CM | POA: Diagnosis not present

## 2020-12-30 DIAGNOSIS — I10 Essential (primary) hypertension: Secondary | ICD-10-CM | POA: Diagnosis not present

## 2020-12-30 DIAGNOSIS — I953 Hypotension of hemodialysis: Secondary | ICD-10-CM | POA: Diagnosis not present

## 2020-12-30 DIAGNOSIS — I129 Hypertensive chronic kidney disease with stage 1 through stage 4 chronic kidney disease, or unspecified chronic kidney disease: Secondary | ICD-10-CM | POA: Diagnosis not present

## 2020-12-30 DIAGNOSIS — K250 Acute gastric ulcer with hemorrhage: Secondary | ICD-10-CM | POA: Diagnosis not present

## 2020-12-30 DIAGNOSIS — K259 Gastric ulcer, unspecified as acute or chronic, without hemorrhage or perforation: Secondary | ICD-10-CM | POA: Diagnosis not present

## 2020-12-30 DIAGNOSIS — D696 Thrombocytopenia, unspecified: Secondary | ICD-10-CM | POA: Diagnosis not present

## 2020-12-30 DIAGNOSIS — B1920 Unspecified viral hepatitis C without hepatic coma: Secondary | ICD-10-CM | POA: Diagnosis not present

## 2020-12-30 DIAGNOSIS — T82510A Breakdown (mechanical) of surgically created arteriovenous fistula, initial encounter: Secondary | ICD-10-CM | POA: Diagnosis not present

## 2020-12-30 DIAGNOSIS — R571 Hypovolemic shock: Secondary | ICD-10-CM | POA: Diagnosis not present

## 2020-12-30 DIAGNOSIS — E1165 Type 2 diabetes mellitus with hyperglycemia: Secondary | ICD-10-CM | POA: Diagnosis not present

## 2020-12-30 DIAGNOSIS — Z794 Long term (current) use of insulin: Secondary | ICD-10-CM | POA: Diagnosis not present

## 2020-12-30 DIAGNOSIS — T82898A Other specified complication of vascular prosthetic devices, implants and grafts, initial encounter: Secondary | ICD-10-CM | POA: Diagnosis not present

## 2020-12-30 DIAGNOSIS — R0602 Shortness of breath: Secondary | ICD-10-CM | POA: Diagnosis not present

## 2020-12-30 DIAGNOSIS — R079 Chest pain, unspecified: Secondary | ICD-10-CM | POA: Diagnosis not present

## 2020-12-30 DIAGNOSIS — I739 Peripheral vascular disease, unspecified: Secondary | ICD-10-CM | POA: Diagnosis not present

## 2020-12-30 DIAGNOSIS — K2970 Gastritis, unspecified, without bleeding: Secondary | ICD-10-CM | POA: Diagnosis not present

## 2020-12-30 DIAGNOSIS — E871 Hypo-osmolality and hyponatremia: Secondary | ICD-10-CM | POA: Diagnosis not present

## 2020-12-30 DIAGNOSIS — J9601 Acute respiratory failure with hypoxia: Secondary | ICD-10-CM | POA: Diagnosis not present

## 2020-12-30 DIAGNOSIS — N250 Renal osteodystrophy: Secondary | ICD-10-CM | POA: Diagnosis not present

## 2020-12-30 DIAGNOSIS — D631 Anemia in chronic kidney disease: Secondary | ICD-10-CM | POA: Diagnosis not present

## 2020-12-30 DIAGNOSIS — D62 Acute posthemorrhagic anemia: Secondary | ICD-10-CM | POA: Diagnosis not present

## 2020-12-30 DIAGNOSIS — Z4901 Encounter for fitting and adjustment of extracorporeal dialysis catheter: Secondary | ICD-10-CM | POA: Diagnosis not present

## 2020-12-30 DIAGNOSIS — N186 End stage renal disease: Secondary | ICD-10-CM | POA: Diagnosis not present

## 2020-12-30 DIAGNOSIS — T82868A Thrombosis of vascular prosthetic devices, implants and grafts, initial encounter: Secondary | ICD-10-CM | POA: Diagnosis not present

## 2020-12-30 DIAGNOSIS — K254 Chronic or unspecified gastric ulcer with hemorrhage: Secondary | ICD-10-CM | POA: Diagnosis not present

## 2020-12-30 DIAGNOSIS — Z992 Dependence on renal dialysis: Secondary | ICD-10-CM | POA: Diagnosis not present

## 2020-12-30 DIAGNOSIS — E875 Hyperkalemia: Secondary | ICD-10-CM | POA: Diagnosis not present

## 2020-12-30 DIAGNOSIS — I120 Hypertensive chronic kidney disease with stage 5 chronic kidney disease or end stage renal disease: Secondary | ICD-10-CM | POA: Diagnosis not present

## 2020-12-30 DIAGNOSIS — Z452 Encounter for adjustment and management of vascular access device: Secondary | ICD-10-CM | POA: Diagnosis not present

## 2020-12-30 DIAGNOSIS — E785 Hyperlipidemia, unspecified: Secondary | ICD-10-CM | POA: Diagnosis not present

## 2020-12-30 DIAGNOSIS — K920 Hematemesis: Secondary | ICD-10-CM | POA: Diagnosis not present

## 2020-12-30 DIAGNOSIS — E1152 Type 2 diabetes mellitus with diabetic peripheral angiopathy with gangrene: Secondary | ICD-10-CM | POA: Diagnosis not present

## 2020-12-30 DIAGNOSIS — E872 Acidosis, unspecified: Secondary | ICD-10-CM | POA: Diagnosis not present

## 2020-12-30 DIAGNOSIS — Z5902 Unsheltered homelessness: Secondary | ICD-10-CM | POA: Diagnosis not present

## 2020-12-30 DIAGNOSIS — T461X5A Adverse effect of calcium-channel blockers, initial encounter: Secondary | ICD-10-CM | POA: Diagnosis not present

## 2020-12-30 DIAGNOSIS — Z89422 Acquired absence of other left toe(s): Secondary | ICD-10-CM | POA: Diagnosis not present

## 2020-12-31 DIAGNOSIS — E1165 Type 2 diabetes mellitus with hyperglycemia: Secondary | ICD-10-CM | POA: Diagnosis not present

## 2020-12-31 DIAGNOSIS — Z794 Long term (current) use of insulin: Secondary | ICD-10-CM | POA: Diagnosis not present

## 2020-12-31 DIAGNOSIS — N186 End stage renal disease: Secondary | ICD-10-CM | POA: Diagnosis not present

## 2020-12-31 DIAGNOSIS — T82898A Other specified complication of vascular prosthetic devices, implants and grafts, initial encounter: Secondary | ICD-10-CM | POA: Diagnosis not present

## 2020-12-31 DIAGNOSIS — E872 Acidosis, unspecified: Secondary | ICD-10-CM | POA: Diagnosis not present

## 2020-12-31 DIAGNOSIS — T82868A Thrombosis of vascular prosthetic devices, implants and grafts, initial encounter: Secondary | ICD-10-CM | POA: Diagnosis not present

## 2020-12-31 DIAGNOSIS — I10 Essential (primary) hypertension: Secondary | ICD-10-CM | POA: Diagnosis not present

## 2020-12-31 DIAGNOSIS — I739 Peripheral vascular disease, unspecified: Secondary | ICD-10-CM | POA: Diagnosis not present

## 2020-12-31 DIAGNOSIS — E875 Hyperkalemia: Secondary | ICD-10-CM | POA: Diagnosis not present

## 2021-01-01 DIAGNOSIS — E872 Acidosis, unspecified: Secondary | ICD-10-CM | POA: Diagnosis not present

## 2021-01-01 DIAGNOSIS — I739 Peripheral vascular disease, unspecified: Secondary | ICD-10-CM | POA: Diagnosis not present

## 2021-01-01 DIAGNOSIS — D62 Acute posthemorrhagic anemia: Secondary | ICD-10-CM | POA: Diagnosis not present

## 2021-01-01 DIAGNOSIS — N186 End stage renal disease: Secondary | ICD-10-CM | POA: Diagnosis not present

## 2021-01-01 DIAGNOSIS — K920 Hematemesis: Secondary | ICD-10-CM | POA: Diagnosis not present

## 2021-01-01 DIAGNOSIS — R079 Chest pain, unspecified: Secondary | ICD-10-CM | POA: Diagnosis not present

## 2021-01-01 DIAGNOSIS — N250 Renal osteodystrophy: Secondary | ICD-10-CM | POA: Diagnosis not present

## 2021-01-01 DIAGNOSIS — E1165 Type 2 diabetes mellitus with hyperglycemia: Secondary | ICD-10-CM | POA: Diagnosis not present

## 2021-01-01 DIAGNOSIS — R571 Hypovolemic shock: Secondary | ICD-10-CM | POA: Diagnosis not present

## 2021-01-01 DIAGNOSIS — E875 Hyperkalemia: Secondary | ICD-10-CM | POA: Diagnosis not present

## 2021-01-01 DIAGNOSIS — Z794 Long term (current) use of insulin: Secondary | ICD-10-CM | POA: Diagnosis not present

## 2021-01-01 DIAGNOSIS — T82898A Other specified complication of vascular prosthetic devices, implants and grafts, initial encounter: Secondary | ICD-10-CM | POA: Diagnosis not present

## 2021-01-01 DIAGNOSIS — I10 Essential (primary) hypertension: Secondary | ICD-10-CM | POA: Diagnosis not present

## 2021-01-02 DIAGNOSIS — K920 Hematemesis: Secondary | ICD-10-CM | POA: Diagnosis not present

## 2021-01-02 DIAGNOSIS — N186 End stage renal disease: Secondary | ICD-10-CM | POA: Diagnosis not present

## 2021-01-02 DIAGNOSIS — I10 Essential (primary) hypertension: Secondary | ICD-10-CM | POA: Diagnosis not present

## 2021-01-02 DIAGNOSIS — E872 Acidosis, unspecified: Secondary | ICD-10-CM | POA: Diagnosis not present

## 2021-01-02 DIAGNOSIS — E875 Hyperkalemia: Secondary | ICD-10-CM | POA: Diagnosis not present

## 2021-01-02 DIAGNOSIS — E1165 Type 2 diabetes mellitus with hyperglycemia: Secondary | ICD-10-CM | POA: Diagnosis not present

## 2021-01-02 DIAGNOSIS — K250 Acute gastric ulcer with hemorrhage: Secondary | ICD-10-CM | POA: Diagnosis not present

## 2021-01-02 DIAGNOSIS — I739 Peripheral vascular disease, unspecified: Secondary | ICD-10-CM | POA: Diagnosis not present

## 2021-01-02 DIAGNOSIS — D62 Acute posthemorrhagic anemia: Secondary | ICD-10-CM | POA: Diagnosis not present

## 2021-01-02 DIAGNOSIS — T82898A Other specified complication of vascular prosthetic devices, implants and grafts, initial encounter: Secondary | ICD-10-CM | POA: Diagnosis not present

## 2021-01-02 DIAGNOSIS — K2970 Gastritis, unspecified, without bleeding: Secondary | ICD-10-CM | POA: Diagnosis not present

## 2021-01-02 DIAGNOSIS — R571 Hypovolemic shock: Secondary | ICD-10-CM | POA: Diagnosis not present

## 2021-01-02 DIAGNOSIS — Z794 Long term (current) use of insulin: Secondary | ICD-10-CM | POA: Diagnosis not present

## 2021-01-03 DIAGNOSIS — D62 Acute posthemorrhagic anemia: Secondary | ICD-10-CM | POA: Diagnosis not present

## 2021-01-03 DIAGNOSIS — N250 Renal osteodystrophy: Secondary | ICD-10-CM | POA: Diagnosis not present

## 2021-01-03 DIAGNOSIS — T82898A Other specified complication of vascular prosthetic devices, implants and grafts, initial encounter: Secondary | ICD-10-CM | POA: Diagnosis not present

## 2021-01-03 DIAGNOSIS — I10 Essential (primary) hypertension: Secondary | ICD-10-CM | POA: Diagnosis not present

## 2021-01-03 DIAGNOSIS — Z794 Long term (current) use of insulin: Secondary | ICD-10-CM | POA: Diagnosis not present

## 2021-01-03 DIAGNOSIS — E875 Hyperkalemia: Secondary | ICD-10-CM | POA: Diagnosis not present

## 2021-01-03 DIAGNOSIS — N186 End stage renal disease: Secondary | ICD-10-CM | POA: Diagnosis not present

## 2021-01-03 DIAGNOSIS — I739 Peripheral vascular disease, unspecified: Secondary | ICD-10-CM | POA: Diagnosis not present

## 2021-01-03 DIAGNOSIS — K920 Hematemesis: Secondary | ICD-10-CM | POA: Diagnosis not present

## 2021-01-03 DIAGNOSIS — E872 Acidosis, unspecified: Secondary | ICD-10-CM | POA: Diagnosis not present

## 2021-01-03 DIAGNOSIS — E1165 Type 2 diabetes mellitus with hyperglycemia: Secondary | ICD-10-CM | POA: Diagnosis not present

## 2021-01-03 DIAGNOSIS — R571 Hypovolemic shock: Secondary | ICD-10-CM | POA: Diagnosis not present

## 2021-01-03 DIAGNOSIS — I129 Hypertensive chronic kidney disease with stage 1 through stage 4 chronic kidney disease, or unspecified chronic kidney disease: Secondary | ICD-10-CM | POA: Diagnosis not present

## 2021-01-04 DIAGNOSIS — I10 Essential (primary) hypertension: Secondary | ICD-10-CM | POA: Diagnosis not present

## 2021-01-04 DIAGNOSIS — E875 Hyperkalemia: Secondary | ICD-10-CM | POA: Diagnosis not present

## 2021-01-04 DIAGNOSIS — I739 Peripheral vascular disease, unspecified: Secondary | ICD-10-CM | POA: Diagnosis not present

## 2021-01-04 DIAGNOSIS — T82898A Other specified complication of vascular prosthetic devices, implants and grafts, initial encounter: Secondary | ICD-10-CM | POA: Diagnosis not present

## 2021-01-04 DIAGNOSIS — Z794 Long term (current) use of insulin: Secondary | ICD-10-CM | POA: Diagnosis not present

## 2021-01-04 DIAGNOSIS — R571 Hypovolemic shock: Secondary | ICD-10-CM | POA: Diagnosis not present

## 2021-01-04 DIAGNOSIS — E1165 Type 2 diabetes mellitus with hyperglycemia: Secondary | ICD-10-CM | POA: Diagnosis not present

## 2021-01-04 DIAGNOSIS — N250 Renal osteodystrophy: Secondary | ICD-10-CM | POA: Diagnosis not present

## 2021-01-04 DIAGNOSIS — K920 Hematemesis: Secondary | ICD-10-CM | POA: Diagnosis not present

## 2021-01-04 DIAGNOSIS — E872 Acidosis, unspecified: Secondary | ICD-10-CM | POA: Diagnosis not present

## 2021-01-04 DIAGNOSIS — N186 End stage renal disease: Secondary | ICD-10-CM | POA: Diagnosis not present

## 2021-01-05 DIAGNOSIS — N186 End stage renal disease: Secondary | ICD-10-CM | POA: Diagnosis not present

## 2021-01-05 DIAGNOSIS — K920 Hematemesis: Secondary | ICD-10-CM | POA: Diagnosis not present

## 2021-01-05 DIAGNOSIS — I129 Hypertensive chronic kidney disease with stage 1 through stage 4 chronic kidney disease, or unspecified chronic kidney disease: Secondary | ICD-10-CM | POA: Diagnosis not present

## 2021-01-05 DIAGNOSIS — T82898A Other specified complication of vascular prosthetic devices, implants and grafts, initial encounter: Secondary | ICD-10-CM | POA: Diagnosis not present

## 2021-01-05 DIAGNOSIS — I739 Peripheral vascular disease, unspecified: Secondary | ICD-10-CM | POA: Diagnosis not present

## 2021-01-05 DIAGNOSIS — I10 Essential (primary) hypertension: Secondary | ICD-10-CM | POA: Diagnosis not present

## 2021-01-05 DIAGNOSIS — E1165 Type 2 diabetes mellitus with hyperglycemia: Secondary | ICD-10-CM | POA: Diagnosis not present

## 2021-01-05 DIAGNOSIS — Z794 Long term (current) use of insulin: Secondary | ICD-10-CM | POA: Diagnosis not present

## 2021-01-06 DIAGNOSIS — K259 Gastric ulcer, unspecified as acute or chronic, without hemorrhage or perforation: Secondary | ICD-10-CM | POA: Diagnosis not present

## 2021-01-06 DIAGNOSIS — I10 Essential (primary) hypertension: Secondary | ICD-10-CM | POA: Diagnosis not present

## 2021-01-06 DIAGNOSIS — N186 End stage renal disease: Secondary | ICD-10-CM | POA: Diagnosis not present

## 2021-01-06 DIAGNOSIS — N250 Renal osteodystrophy: Secondary | ICD-10-CM | POA: Diagnosis not present

## 2021-01-06 DIAGNOSIS — U071 COVID-19: Secondary | ICD-10-CM | POA: Diagnosis not present

## 2021-01-06 DIAGNOSIS — E1165 Type 2 diabetes mellitus with hyperglycemia: Secondary | ICD-10-CM | POA: Diagnosis not present

## 2021-01-06 DIAGNOSIS — E872 Acidosis, unspecified: Secondary | ICD-10-CM | POA: Diagnosis not present

## 2021-01-06 DIAGNOSIS — D62 Acute posthemorrhagic anemia: Secondary | ICD-10-CM | POA: Diagnosis not present

## 2021-01-06 DIAGNOSIS — I739 Peripheral vascular disease, unspecified: Secondary | ICD-10-CM | POA: Diagnosis not present

## 2021-01-06 DIAGNOSIS — T82898A Other specified complication of vascular prosthetic devices, implants and grafts, initial encounter: Secondary | ICD-10-CM | POA: Diagnosis not present

## 2021-01-07 DIAGNOSIS — R933 Abnormal findings on diagnostic imaging of other parts of digestive tract: Secondary | ICD-10-CM | POA: Diagnosis not present

## 2021-01-07 DIAGNOSIS — T82898A Other specified complication of vascular prosthetic devices, implants and grafts, initial encounter: Secondary | ICD-10-CM | POA: Diagnosis not present

## 2021-01-07 DIAGNOSIS — I739 Peripheral vascular disease, unspecified: Secondary | ICD-10-CM | POA: Diagnosis not present

## 2021-01-07 DIAGNOSIS — U071 COVID-19: Secondary | ICD-10-CM | POA: Diagnosis not present

## 2021-01-07 DIAGNOSIS — D62 Acute posthemorrhagic anemia: Secondary | ICD-10-CM | POA: Diagnosis not present

## 2021-01-07 DIAGNOSIS — N186 End stage renal disease: Secondary | ICD-10-CM | POA: Diagnosis not present

## 2021-01-07 DIAGNOSIS — K259 Gastric ulcer, unspecified as acute or chronic, without hemorrhage or perforation: Secondary | ICD-10-CM | POA: Diagnosis not present

## 2021-01-07 DIAGNOSIS — E1165 Type 2 diabetes mellitus with hyperglycemia: Secondary | ICD-10-CM | POA: Diagnosis not present

## 2021-01-07 DIAGNOSIS — I10 Essential (primary) hypertension: Secondary | ICD-10-CM | POA: Diagnosis not present

## 2021-01-08 DIAGNOSIS — E1165 Type 2 diabetes mellitus with hyperglycemia: Secondary | ICD-10-CM | POA: Diagnosis not present

## 2021-01-08 DIAGNOSIS — I10 Essential (primary) hypertension: Secondary | ICD-10-CM | POA: Diagnosis not present

## 2021-01-08 DIAGNOSIS — K259 Gastric ulcer, unspecified as acute or chronic, without hemorrhage or perforation: Secondary | ICD-10-CM | POA: Diagnosis not present

## 2021-01-08 DIAGNOSIS — I739 Peripheral vascular disease, unspecified: Secondary | ICD-10-CM | POA: Diagnosis not present

## 2021-01-08 DIAGNOSIS — U071 COVID-19: Secondary | ICD-10-CM | POA: Diagnosis not present

## 2021-01-08 DIAGNOSIS — N186 End stage renal disease: Secondary | ICD-10-CM | POA: Diagnosis not present

## 2021-01-08 DIAGNOSIS — D62 Acute posthemorrhagic anemia: Secondary | ICD-10-CM | POA: Diagnosis not present

## 2021-01-08 DIAGNOSIS — R933 Abnormal findings on diagnostic imaging of other parts of digestive tract: Secondary | ICD-10-CM | POA: Diagnosis not present

## 2021-01-09 DIAGNOSIS — I10 Essential (primary) hypertension: Secondary | ICD-10-CM | POA: Diagnosis not present

## 2021-01-09 DIAGNOSIS — D696 Thrombocytopenia, unspecified: Secondary | ICD-10-CM | POA: Diagnosis not present

## 2021-01-09 DIAGNOSIS — N186 End stage renal disease: Secondary | ICD-10-CM | POA: Diagnosis not present

## 2021-01-09 DIAGNOSIS — D62 Acute posthemorrhagic anemia: Secondary | ICD-10-CM | POA: Diagnosis not present

## 2021-01-09 DIAGNOSIS — R933 Abnormal findings on diagnostic imaging of other parts of digestive tract: Secondary | ICD-10-CM | POA: Diagnosis not present

## 2021-01-09 DIAGNOSIS — U071 COVID-19: Secondary | ICD-10-CM | POA: Diagnosis not present

## 2021-01-09 DIAGNOSIS — I739 Peripheral vascular disease, unspecified: Secondary | ICD-10-CM | POA: Diagnosis not present

## 2021-01-09 DIAGNOSIS — K259 Gastric ulcer, unspecified as acute or chronic, without hemorrhage or perforation: Secondary | ICD-10-CM | POA: Diagnosis not present

## 2021-01-09 DIAGNOSIS — E1165 Type 2 diabetes mellitus with hyperglycemia: Secondary | ICD-10-CM | POA: Diagnosis not present

## 2021-01-10 DIAGNOSIS — N250 Renal osteodystrophy: Secondary | ICD-10-CM | POA: Diagnosis not present

## 2021-01-10 DIAGNOSIS — K259 Gastric ulcer, unspecified as acute or chronic, without hemorrhage or perforation: Secondary | ICD-10-CM | POA: Diagnosis not present

## 2021-01-10 DIAGNOSIS — N186 End stage renal disease: Secondary | ICD-10-CM | POA: Diagnosis not present

## 2021-01-10 DIAGNOSIS — I739 Peripheral vascular disease, unspecified: Secondary | ICD-10-CM | POA: Diagnosis not present

## 2021-01-10 DIAGNOSIS — U071 COVID-19: Secondary | ICD-10-CM | POA: Diagnosis not present

## 2021-01-10 DIAGNOSIS — D696 Thrombocytopenia, unspecified: Secondary | ICD-10-CM | POA: Diagnosis not present

## 2021-01-10 DIAGNOSIS — R933 Abnormal findings on diagnostic imaging of other parts of digestive tract: Secondary | ICD-10-CM | POA: Diagnosis not present

## 2021-01-10 DIAGNOSIS — T82898A Other specified complication of vascular prosthetic devices, implants and grafts, initial encounter: Secondary | ICD-10-CM | POA: Diagnosis not present

## 2021-01-10 DIAGNOSIS — E1165 Type 2 diabetes mellitus with hyperglycemia: Secondary | ICD-10-CM | POA: Diagnosis not present

## 2021-01-10 DIAGNOSIS — D62 Acute posthemorrhagic anemia: Secondary | ICD-10-CM | POA: Diagnosis not present

## 2021-01-10 DIAGNOSIS — I10 Essential (primary) hypertension: Secondary | ICD-10-CM | POA: Diagnosis not present

## 2021-01-11 DIAGNOSIS — D62 Acute posthemorrhagic anemia: Secondary | ICD-10-CM | POA: Diagnosis not present

## 2021-01-11 DIAGNOSIS — R933 Abnormal findings on diagnostic imaging of other parts of digestive tract: Secondary | ICD-10-CM | POA: Diagnosis not present

## 2021-01-11 DIAGNOSIS — N250 Renal osteodystrophy: Secondary | ICD-10-CM | POA: Diagnosis not present

## 2021-01-11 DIAGNOSIS — I10 Essential (primary) hypertension: Secondary | ICD-10-CM | POA: Diagnosis not present

## 2021-01-11 DIAGNOSIS — E1165 Type 2 diabetes mellitus with hyperglycemia: Secondary | ICD-10-CM | POA: Diagnosis not present

## 2021-01-11 DIAGNOSIS — I739 Peripheral vascular disease, unspecified: Secondary | ICD-10-CM | POA: Diagnosis not present

## 2021-01-11 DIAGNOSIS — D696 Thrombocytopenia, unspecified: Secondary | ICD-10-CM | POA: Diagnosis not present

## 2021-01-11 DIAGNOSIS — N186 End stage renal disease: Secondary | ICD-10-CM | POA: Diagnosis not present

## 2021-01-11 DIAGNOSIS — K259 Gastric ulcer, unspecified as acute or chronic, without hemorrhage or perforation: Secondary | ICD-10-CM | POA: Diagnosis not present

## 2021-01-11 DIAGNOSIS — U071 COVID-19: Secondary | ICD-10-CM | POA: Diagnosis not present

## 2021-01-11 DIAGNOSIS — Z4901 Encounter for fitting and adjustment of extracorporeal dialysis catheter: Secondary | ICD-10-CM | POA: Diagnosis not present

## 2021-01-12 DIAGNOSIS — U071 COVID-19: Secondary | ICD-10-CM | POA: Diagnosis not present

## 2021-01-12 DIAGNOSIS — D62 Acute posthemorrhagic anemia: Secondary | ICD-10-CM | POA: Diagnosis not present

## 2021-01-12 DIAGNOSIS — I10 Essential (primary) hypertension: Secondary | ICD-10-CM | POA: Diagnosis not present

## 2021-01-12 DIAGNOSIS — K259 Gastric ulcer, unspecified as acute or chronic, without hemorrhage or perforation: Secondary | ICD-10-CM | POA: Diagnosis not present

## 2021-01-12 DIAGNOSIS — E1165 Type 2 diabetes mellitus with hyperglycemia: Secondary | ICD-10-CM | POA: Diagnosis not present

## 2021-01-12 DIAGNOSIS — R933 Abnormal findings on diagnostic imaging of other parts of digestive tract: Secondary | ICD-10-CM | POA: Diagnosis not present

## 2021-01-12 DIAGNOSIS — I739 Peripheral vascular disease, unspecified: Secondary | ICD-10-CM | POA: Diagnosis not present

## 2021-01-12 DIAGNOSIS — N186 End stage renal disease: Secondary | ICD-10-CM | POA: Diagnosis not present

## 2021-01-12 DIAGNOSIS — N250 Renal osteodystrophy: Secondary | ICD-10-CM | POA: Diagnosis not present

## 2021-01-13 DIAGNOSIS — N250 Renal osteodystrophy: Secondary | ICD-10-CM | POA: Diagnosis not present

## 2021-01-13 DIAGNOSIS — E872 Acidosis, unspecified: Secondary | ICD-10-CM | POA: Diagnosis not present

## 2021-01-13 DIAGNOSIS — N186 End stage renal disease: Secondary | ICD-10-CM | POA: Diagnosis not present

## 2021-01-13 DIAGNOSIS — I120 Hypertensive chronic kidney disease with stage 5 chronic kidney disease or end stage renal disease: Secondary | ICD-10-CM | POA: Diagnosis not present

## 2021-01-14 DIAGNOSIS — N250 Renal osteodystrophy: Secondary | ICD-10-CM | POA: Diagnosis not present

## 2021-01-14 DIAGNOSIS — K920 Hematemesis: Secondary | ICD-10-CM | POA: Diagnosis not present

## 2021-01-14 DIAGNOSIS — E1165 Type 2 diabetes mellitus with hyperglycemia: Secondary | ICD-10-CM | POA: Diagnosis not present

## 2021-01-14 DIAGNOSIS — R571 Hypovolemic shock: Secondary | ICD-10-CM | POA: Diagnosis not present

## 2021-01-14 DIAGNOSIS — I10 Essential (primary) hypertension: Secondary | ICD-10-CM | POA: Diagnosis not present

## 2021-01-14 DIAGNOSIS — N186 End stage renal disease: Secondary | ICD-10-CM | POA: Diagnosis not present

## 2021-01-16 DIAGNOSIS — M85871 Other specified disorders of bone density and structure, right ankle and foot: Secondary | ICD-10-CM | POA: Diagnosis not present

## 2021-01-17 DIAGNOSIS — E669 Obesity, unspecified: Secondary | ICD-10-CM | POA: Diagnosis not present

## 2021-01-17 DIAGNOSIS — E871 Hypo-osmolality and hyponatremia: Secondary | ICD-10-CM | POA: Diagnosis not present

## 2021-01-17 DIAGNOSIS — B999 Unspecified infectious disease: Secondary | ICD-10-CM | POA: Diagnosis not present

## 2021-01-17 DIAGNOSIS — I1311 Hypertensive heart and chronic kidney disease without heart failure, with stage 5 chronic kidney disease, or end stage renal disease: Secondary | ICD-10-CM | POA: Diagnosis not present

## 2021-01-17 DIAGNOSIS — E1152 Type 2 diabetes mellitus with diabetic peripheral angiopathy with gangrene: Secondary | ICD-10-CM | POA: Diagnosis not present

## 2021-01-17 DIAGNOSIS — E1165 Type 2 diabetes mellitus with hyperglycemia: Secondary | ICD-10-CM | POA: Diagnosis not present

## 2021-01-17 DIAGNOSIS — N186 End stage renal disease: Secondary | ICD-10-CM | POA: Diagnosis not present

## 2021-01-17 DIAGNOSIS — M85871 Other specified disorders of bone density and structure, right ankle and foot: Secondary | ICD-10-CM | POA: Diagnosis not present

## 2021-01-17 DIAGNOSIS — M86171 Other acute osteomyelitis, right ankle and foot: Secondary | ICD-10-CM | POA: Diagnosis not present

## 2021-01-17 DIAGNOSIS — Z992 Dependence on renal dialysis: Secondary | ICD-10-CM | POA: Diagnosis not present

## 2021-01-17 DIAGNOSIS — I96 Gangrene, not elsewhere classified: Secondary | ICD-10-CM | POA: Diagnosis not present

## 2021-01-17 DIAGNOSIS — K219 Gastro-esophageal reflux disease without esophagitis: Secondary | ICD-10-CM | POA: Diagnosis not present

## 2021-01-17 DIAGNOSIS — I120 Hypertensive chronic kidney disease with stage 5 chronic kidney disease or end stage renal disease: Secondary | ICD-10-CM | POA: Diagnosis not present

## 2021-01-17 DIAGNOSIS — D649 Anemia, unspecified: Secondary | ICD-10-CM | POA: Diagnosis not present

## 2021-01-17 DIAGNOSIS — E11621 Type 2 diabetes mellitus with foot ulcer: Secondary | ICD-10-CM | POA: Diagnosis not present

## 2021-01-17 DIAGNOSIS — Z4781 Encounter for orthopedic aftercare following surgical amputation: Secondary | ICD-10-CM | POA: Diagnosis not present

## 2021-01-17 DIAGNOSIS — M868X7 Other osteomyelitis, ankle and foot: Secondary | ICD-10-CM | POA: Diagnosis not present

## 2021-01-17 DIAGNOSIS — L97514 Non-pressure chronic ulcer of other part of right foot with necrosis of bone: Secondary | ICD-10-CM | POA: Diagnosis not present

## 2021-01-17 DIAGNOSIS — M79671 Pain in right foot: Secondary | ICD-10-CM | POA: Diagnosis not present

## 2021-01-17 DIAGNOSIS — L988 Other specified disorders of the skin and subcutaneous tissue: Secondary | ICD-10-CM | POA: Diagnosis not present

## 2021-01-17 DIAGNOSIS — R7982 Elevated C-reactive protein (CRP): Secondary | ICD-10-CM | POA: Diagnosis not present

## 2021-01-17 DIAGNOSIS — R7989 Other specified abnormal findings of blood chemistry: Secondary | ICD-10-CM | POA: Diagnosis not present

## 2021-01-17 DIAGNOSIS — Z9889 Other specified postprocedural states: Secondary | ICD-10-CM | POA: Diagnosis not present

## 2021-01-17 DIAGNOSIS — G8929 Other chronic pain: Secondary | ICD-10-CM | POA: Diagnosis not present

## 2021-01-17 DIAGNOSIS — Z720 Tobacco use: Secondary | ICD-10-CM | POA: Diagnosis not present

## 2021-01-17 DIAGNOSIS — E1122 Type 2 diabetes mellitus with diabetic chronic kidney disease: Secondary | ICD-10-CM | POA: Diagnosis not present

## 2021-01-17 DIAGNOSIS — D631 Anemia in chronic kidney disease: Secondary | ICD-10-CM | POA: Diagnosis not present

## 2021-01-17 DIAGNOSIS — L97518 Non-pressure chronic ulcer of other part of right foot with other specified severity: Secondary | ICD-10-CM | POA: Diagnosis not present

## 2021-01-17 DIAGNOSIS — M86371 Chronic multifocal osteomyelitis, right ankle and foot: Secondary | ICD-10-CM | POA: Diagnosis not present

## 2021-01-17 DIAGNOSIS — E875 Hyperkalemia: Secondary | ICD-10-CM | POA: Diagnosis not present

## 2021-01-17 DIAGNOSIS — M7989 Other specified soft tissue disorders: Secondary | ICD-10-CM | POA: Diagnosis not present

## 2021-01-17 DIAGNOSIS — Z794 Long term (current) use of insulin: Secondary | ICD-10-CM | POA: Diagnosis not present

## 2021-01-17 DIAGNOSIS — Z6837 Body mass index (BMI) 37.0-37.9, adult: Secondary | ICD-10-CM | POA: Diagnosis not present

## 2021-01-17 DIAGNOSIS — E1169 Type 2 diabetes mellitus with other specified complication: Secondary | ICD-10-CM | POA: Diagnosis not present

## 2021-01-17 DIAGNOSIS — R600 Localized edema: Secondary | ICD-10-CM | POA: Diagnosis not present

## 2021-01-17 DIAGNOSIS — D696 Thrombocytopenia, unspecified: Secondary | ICD-10-CM | POA: Diagnosis not present

## 2021-01-17 DIAGNOSIS — M869 Osteomyelitis, unspecified: Secondary | ICD-10-CM | POA: Diagnosis not present

## 2021-01-17 DIAGNOSIS — Z89422 Acquired absence of other left toe(s): Secondary | ICD-10-CM | POA: Diagnosis not present

## 2021-01-17 DIAGNOSIS — I70201 Unspecified atherosclerosis of native arteries of extremities, right leg: Secondary | ICD-10-CM | POA: Diagnosis not present

## 2021-01-17 DIAGNOSIS — E1142 Type 2 diabetes mellitus with diabetic polyneuropathy: Secondary | ICD-10-CM | POA: Diagnosis not present

## 2021-01-18 DIAGNOSIS — E1152 Type 2 diabetes mellitus with diabetic peripheral angiopathy with gangrene: Secondary | ICD-10-CM | POA: Diagnosis not present

## 2021-01-18 DIAGNOSIS — E1122 Type 2 diabetes mellitus with diabetic chronic kidney disease: Secondary | ICD-10-CM | POA: Diagnosis not present

## 2021-01-18 DIAGNOSIS — M7989 Other specified soft tissue disorders: Secondary | ICD-10-CM | POA: Diagnosis not present

## 2021-01-18 DIAGNOSIS — I96 Gangrene, not elsewhere classified: Secondary | ICD-10-CM | POA: Diagnosis not present

## 2021-01-18 DIAGNOSIS — R600 Localized edema: Secondary | ICD-10-CM | POA: Diagnosis not present

## 2021-01-18 DIAGNOSIS — I120 Hypertensive chronic kidney disease with stage 5 chronic kidney disease or end stage renal disease: Secondary | ICD-10-CM | POA: Diagnosis not present

## 2021-01-18 DIAGNOSIS — Z992 Dependence on renal dialysis: Secondary | ICD-10-CM | POA: Diagnosis not present

## 2021-01-18 DIAGNOSIS — E1169 Type 2 diabetes mellitus with other specified complication: Secondary | ICD-10-CM | POA: Diagnosis not present

## 2021-01-18 DIAGNOSIS — M86371 Chronic multifocal osteomyelitis, right ankle and foot: Secondary | ICD-10-CM | POA: Diagnosis not present

## 2021-01-18 DIAGNOSIS — N186 End stage renal disease: Secondary | ICD-10-CM | POA: Diagnosis not present

## 2021-01-18 DIAGNOSIS — E1142 Type 2 diabetes mellitus with diabetic polyneuropathy: Secondary | ICD-10-CM | POA: Diagnosis not present

## 2021-01-20 DIAGNOSIS — Z4781 Encounter for orthopedic aftercare following surgical amputation: Secondary | ICD-10-CM | POA: Diagnosis not present

## 2021-01-20 DIAGNOSIS — M868X7 Other osteomyelitis, ankle and foot: Secondary | ICD-10-CM | POA: Diagnosis not present

## 2021-01-20 DIAGNOSIS — Z9889 Other specified postprocedural states: Secondary | ICD-10-CM | POA: Diagnosis not present

## 2021-01-22 DIAGNOSIS — Z992 Dependence on renal dialysis: Secondary | ICD-10-CM | POA: Diagnosis not present

## 2021-01-22 DIAGNOSIS — E1152 Type 2 diabetes mellitus with diabetic peripheral angiopathy with gangrene: Secondary | ICD-10-CM | POA: Diagnosis not present

## 2021-01-22 DIAGNOSIS — E1142 Type 2 diabetes mellitus with diabetic polyneuropathy: Secondary | ICD-10-CM | POA: Diagnosis not present

## 2021-01-22 DIAGNOSIS — M86371 Chronic multifocal osteomyelitis, right ankle and foot: Secondary | ICD-10-CM | POA: Diagnosis not present

## 2021-01-22 DIAGNOSIS — E1122 Type 2 diabetes mellitus with diabetic chronic kidney disease: Secondary | ICD-10-CM | POA: Diagnosis not present

## 2021-01-22 DIAGNOSIS — I120 Hypertensive chronic kidney disease with stage 5 chronic kidney disease or end stage renal disease: Secondary | ICD-10-CM | POA: Diagnosis not present

## 2021-01-22 DIAGNOSIS — K219 Gastro-esophageal reflux disease without esophagitis: Secondary | ICD-10-CM | POA: Diagnosis not present

## 2021-01-22 DIAGNOSIS — N186 End stage renal disease: Secondary | ICD-10-CM | POA: Diagnosis not present

## 2021-01-22 DIAGNOSIS — I96 Gangrene, not elsewhere classified: Secondary | ICD-10-CM | POA: Diagnosis not present

## 2021-01-22 DIAGNOSIS — E1169 Type 2 diabetes mellitus with other specified complication: Secondary | ICD-10-CM | POA: Diagnosis not present

## 2021-01-23 DIAGNOSIS — E1122 Type 2 diabetes mellitus with diabetic chronic kidney disease: Secondary | ICD-10-CM | POA: Diagnosis not present

## 2021-01-23 DIAGNOSIS — K219 Gastro-esophageal reflux disease without esophagitis: Secondary | ICD-10-CM | POA: Diagnosis not present

## 2021-01-23 DIAGNOSIS — E1152 Type 2 diabetes mellitus with diabetic peripheral angiopathy with gangrene: Secondary | ICD-10-CM | POA: Diagnosis not present

## 2021-01-23 DIAGNOSIS — Z992 Dependence on renal dialysis: Secondary | ICD-10-CM | POA: Diagnosis not present

## 2021-01-23 DIAGNOSIS — E1142 Type 2 diabetes mellitus with diabetic polyneuropathy: Secondary | ICD-10-CM | POA: Diagnosis not present

## 2021-01-23 DIAGNOSIS — I96 Gangrene, not elsewhere classified: Secondary | ICD-10-CM | POA: Diagnosis not present

## 2021-01-23 DIAGNOSIS — E1169 Type 2 diabetes mellitus with other specified complication: Secondary | ICD-10-CM | POA: Diagnosis not present

## 2021-01-23 DIAGNOSIS — I120 Hypertensive chronic kidney disease with stage 5 chronic kidney disease or end stage renal disease: Secondary | ICD-10-CM | POA: Diagnosis not present

## 2021-01-23 DIAGNOSIS — M86371 Chronic multifocal osteomyelitis, right ankle and foot: Secondary | ICD-10-CM | POA: Diagnosis not present

## 2021-01-23 DIAGNOSIS — N186 End stage renal disease: Secondary | ICD-10-CM | POA: Diagnosis not present

## 2021-01-24 DIAGNOSIS — I96 Gangrene, not elsewhere classified: Secondary | ICD-10-CM | POA: Diagnosis not present

## 2021-01-24 DIAGNOSIS — D509 Iron deficiency anemia, unspecified: Secondary | ICD-10-CM | POA: Diagnosis not present

## 2021-01-24 DIAGNOSIS — D689 Coagulation defect, unspecified: Secondary | ICD-10-CM | POA: Diagnosis not present

## 2021-01-24 DIAGNOSIS — E1165 Type 2 diabetes mellitus with hyperglycemia: Secondary | ICD-10-CM | POA: Diagnosis not present

## 2021-01-24 DIAGNOSIS — E875 Hyperkalemia: Secondary | ICD-10-CM | POA: Diagnosis not present

## 2021-01-24 DIAGNOSIS — S91301D Unspecified open wound, right foot, subsequent encounter: Secondary | ICD-10-CM | POA: Diagnosis not present

## 2021-01-24 DIAGNOSIS — N2581 Secondary hyperparathyroidism of renal origin: Secondary | ICD-10-CM | POA: Diagnosis not present

## 2021-01-24 DIAGNOSIS — M869 Osteomyelitis, unspecified: Secondary | ICD-10-CM | POA: Diagnosis not present

## 2021-01-24 DIAGNOSIS — R062 Wheezing: Secondary | ICD-10-CM | POA: Diagnosis not present

## 2021-01-24 DIAGNOSIS — Z794 Long term (current) use of insulin: Secondary | ICD-10-CM | POA: Diagnosis not present

## 2021-01-24 DIAGNOSIS — Z111 Encounter for screening for respiratory tuberculosis: Secondary | ICD-10-CM | POA: Diagnosis not present

## 2021-01-24 DIAGNOSIS — Z741 Need for assistance with personal care: Secondary | ICD-10-CM | POA: Diagnosis not present

## 2021-01-24 DIAGNOSIS — E1122 Type 2 diabetes mellitus with diabetic chronic kidney disease: Secondary | ICD-10-CM | POA: Diagnosis not present

## 2021-01-24 DIAGNOSIS — R197 Diarrhea, unspecified: Secondary | ICD-10-CM | POA: Diagnosis not present

## 2021-01-24 DIAGNOSIS — M79671 Pain in right foot: Secondary | ICD-10-CM | POA: Diagnosis not present

## 2021-01-24 DIAGNOSIS — E11621 Type 2 diabetes mellitus with foot ulcer: Secondary | ICD-10-CM | POA: Diagnosis not present

## 2021-01-24 DIAGNOSIS — M86679 Other chronic osteomyelitis, unspecified ankle and foot: Secondary | ICD-10-CM | POA: Diagnosis not present

## 2021-01-24 DIAGNOSIS — H35033 Hypertensive retinopathy, bilateral: Secondary | ICD-10-CM | POA: Diagnosis not present

## 2021-01-24 DIAGNOSIS — E1152 Type 2 diabetes mellitus with diabetic peripheral angiopathy with gangrene: Secondary | ICD-10-CM | POA: Diagnosis not present

## 2021-01-24 DIAGNOSIS — L89619 Pressure ulcer of right heel, unspecified stage: Secondary | ICD-10-CM | POA: Diagnosis not present

## 2021-01-24 DIAGNOSIS — I120 Hypertensive chronic kidney disease with stage 5 chronic kidney disease or end stage renal disease: Secondary | ICD-10-CM | POA: Diagnosis not present

## 2021-01-24 DIAGNOSIS — N184 Chronic kidney disease, stage 4 (severe): Secondary | ICD-10-CM | POA: Diagnosis not present

## 2021-01-24 DIAGNOSIS — N186 End stage renal disease: Secondary | ICD-10-CM | POA: Diagnosis not present

## 2021-01-24 DIAGNOSIS — I10 Essential (primary) hypertension: Secondary | ICD-10-CM | POA: Diagnosis not present

## 2021-01-24 DIAGNOSIS — E1142 Type 2 diabetes mellitus with diabetic polyneuropathy: Secondary | ICD-10-CM | POA: Diagnosis not present

## 2021-01-24 DIAGNOSIS — Z4781 Encounter for orthopedic aftercare following surgical amputation: Secondary | ICD-10-CM | POA: Diagnosis not present

## 2021-01-24 DIAGNOSIS — R2689 Other abnormalities of gait and mobility: Secondary | ICD-10-CM | POA: Diagnosis not present

## 2021-01-24 DIAGNOSIS — R2241 Localized swelling, mass and lump, right lower limb: Secondary | ICD-10-CM | POA: Diagnosis not present

## 2021-01-24 DIAGNOSIS — E871 Hypo-osmolality and hyponatremia: Secondary | ICD-10-CM | POA: Diagnosis not present

## 2021-01-24 DIAGNOSIS — L97519 Non-pressure chronic ulcer of other part of right foot with unspecified severity: Secondary | ICD-10-CM | POA: Diagnosis not present

## 2021-01-24 DIAGNOSIS — Z8679 Personal history of other diseases of the circulatory system: Secondary | ICD-10-CM | POA: Diagnosis not present

## 2021-01-24 DIAGNOSIS — Z89421 Acquired absence of other right toe(s): Secondary | ICD-10-CM | POA: Diagnosis not present

## 2021-01-24 DIAGNOSIS — R609 Edema, unspecified: Secondary | ICD-10-CM | POA: Diagnosis not present

## 2021-01-24 DIAGNOSIS — B952 Enterococcus as the cause of diseases classified elsewhere: Secondary | ICD-10-CM | POA: Diagnosis not present

## 2021-01-24 DIAGNOSIS — R079 Chest pain, unspecified: Secondary | ICD-10-CM | POA: Diagnosis not present

## 2021-01-24 DIAGNOSIS — M6281 Muscle weakness (generalized): Secondary | ICD-10-CM | POA: Diagnosis not present

## 2021-01-24 DIAGNOSIS — T82858A Stenosis of vascular prosthetic devices, implants and grafts, initial encounter: Secondary | ICD-10-CM | POA: Diagnosis not present

## 2021-01-24 DIAGNOSIS — M792 Neuralgia and neuritis, unspecified: Secondary | ICD-10-CM | POA: Diagnosis not present

## 2021-01-24 DIAGNOSIS — E1322 Other specified diabetes mellitus with diabetic chronic kidney disease: Secondary | ICD-10-CM | POA: Diagnosis not present

## 2021-01-24 DIAGNOSIS — Z992 Dependence on renal dialysis: Secondary | ICD-10-CM | POA: Diagnosis not present

## 2021-01-24 DIAGNOSIS — L89612 Pressure ulcer of right heel, stage 2: Secondary | ICD-10-CM | POA: Diagnosis not present

## 2021-01-24 DIAGNOSIS — B999 Unspecified infectious disease: Secondary | ICD-10-CM | POA: Diagnosis not present

## 2021-01-24 DIAGNOSIS — Z91199 Patient's noncompliance with other medical treatment and regimen due to unspecified reason: Secondary | ICD-10-CM | POA: Diagnosis not present

## 2021-01-24 DIAGNOSIS — E1169 Type 2 diabetes mellitus with other specified complication: Secondary | ICD-10-CM | POA: Diagnosis not present

## 2021-01-24 DIAGNOSIS — S91109D Unspecified open wound of unspecified toe(s) without damage to nail, subsequent encounter: Secondary | ICD-10-CM | POA: Diagnosis not present

## 2021-01-24 DIAGNOSIS — H2513 Age-related nuclear cataract, bilateral: Secondary | ICD-10-CM | POA: Diagnosis not present

## 2021-01-24 DIAGNOSIS — B961 Klebsiella pneumoniae [K. pneumoniae] as the cause of diseases classified elsewhere: Secondary | ICD-10-CM | POA: Diagnosis not present

## 2021-01-24 DIAGNOSIS — S91101D Unspecified open wound of right great toe without damage to nail, subsequent encounter: Secondary | ICD-10-CM | POA: Diagnosis not present

## 2021-01-24 DIAGNOSIS — R29898 Other symptoms and signs involving the musculoskeletal system: Secondary | ICD-10-CM | POA: Diagnosis not present

## 2021-01-24 DIAGNOSIS — D631 Anemia in chronic kidney disease: Secondary | ICD-10-CM | POA: Diagnosis not present

## 2021-01-24 DIAGNOSIS — M86371 Chronic multifocal osteomyelitis, right ankle and foot: Secondary | ICD-10-CM | POA: Diagnosis not present

## 2021-01-24 DIAGNOSIS — E119 Type 2 diabetes mellitus without complications: Secondary | ICD-10-CM | POA: Diagnosis not present

## 2021-01-24 DIAGNOSIS — B958 Unspecified staphylococcus as the cause of diseases classified elsewhere: Secondary | ICD-10-CM | POA: Diagnosis not present

## 2021-01-24 DIAGNOSIS — L97508 Non-pressure chronic ulcer of other part of unspecified foot with other specified severity: Secondary | ICD-10-CM | POA: Diagnosis not present

## 2021-01-25 DIAGNOSIS — M6281 Muscle weakness (generalized): Secondary | ICD-10-CM | POA: Diagnosis not present

## 2021-01-25 DIAGNOSIS — S91109D Unspecified open wound of unspecified toe(s) without damage to nail, subsequent encounter: Secondary | ICD-10-CM | POA: Diagnosis not present

## 2021-01-25 DIAGNOSIS — E119 Type 2 diabetes mellitus without complications: Secondary | ICD-10-CM | POA: Diagnosis not present

## 2021-01-25 DIAGNOSIS — Z992 Dependence on renal dialysis: Secondary | ICD-10-CM | POA: Diagnosis not present

## 2021-01-25 DIAGNOSIS — E1122 Type 2 diabetes mellitus with diabetic chronic kidney disease: Secondary | ICD-10-CM | POA: Diagnosis not present

## 2021-01-25 DIAGNOSIS — D509 Iron deficiency anemia, unspecified: Secondary | ICD-10-CM | POA: Diagnosis not present

## 2021-01-25 DIAGNOSIS — D631 Anemia in chronic kidney disease: Secondary | ICD-10-CM | POA: Diagnosis not present

## 2021-01-25 DIAGNOSIS — D689 Coagulation defect, unspecified: Secondary | ICD-10-CM | POA: Diagnosis not present

## 2021-01-25 DIAGNOSIS — N186 End stage renal disease: Secondary | ICD-10-CM | POA: Diagnosis not present

## 2021-01-25 DIAGNOSIS — S91301D Unspecified open wound, right foot, subsequent encounter: Secondary | ICD-10-CM | POA: Diagnosis not present

## 2021-01-25 DIAGNOSIS — N2581 Secondary hyperparathyroidism of renal origin: Secondary | ICD-10-CM | POA: Diagnosis not present

## 2021-01-25 DIAGNOSIS — Z111 Encounter for screening for respiratory tuberculosis: Secondary | ICD-10-CM | POA: Diagnosis not present

## 2021-01-26 DIAGNOSIS — E1142 Type 2 diabetes mellitus with diabetic polyneuropathy: Secondary | ICD-10-CM | POA: Diagnosis not present

## 2021-01-26 DIAGNOSIS — R609 Edema, unspecified: Secondary | ICD-10-CM | POA: Diagnosis not present

## 2021-01-26 DIAGNOSIS — Z992 Dependence on renal dialysis: Secondary | ICD-10-CM | POA: Diagnosis not present

## 2021-01-26 DIAGNOSIS — M792 Neuralgia and neuritis, unspecified: Secondary | ICD-10-CM | POA: Diagnosis not present

## 2021-01-26 DIAGNOSIS — R29898 Other symptoms and signs involving the musculoskeletal system: Secondary | ICD-10-CM | POA: Diagnosis not present

## 2021-01-26 DIAGNOSIS — N186 End stage renal disease: Secondary | ICD-10-CM | POA: Diagnosis not present

## 2021-01-26 DIAGNOSIS — Z741 Need for assistance with personal care: Secondary | ICD-10-CM | POA: Diagnosis not present

## 2021-01-26 DIAGNOSIS — E1165 Type 2 diabetes mellitus with hyperglycemia: Secondary | ICD-10-CM | POA: Diagnosis not present

## 2021-01-26 DIAGNOSIS — Z794 Long term (current) use of insulin: Secondary | ICD-10-CM | POA: Diagnosis not present

## 2021-01-26 DIAGNOSIS — R062 Wheezing: Secondary | ICD-10-CM | POA: Diagnosis not present

## 2021-01-26 DIAGNOSIS — E1322 Other specified diabetes mellitus with diabetic chronic kidney disease: Secondary | ICD-10-CM | POA: Diagnosis not present

## 2021-01-26 DIAGNOSIS — I10 Essential (primary) hypertension: Secondary | ICD-10-CM | POA: Diagnosis not present

## 2021-01-26 DIAGNOSIS — M869 Osteomyelitis, unspecified: Secondary | ICD-10-CM | POA: Diagnosis not present

## 2021-01-27 DIAGNOSIS — Z992 Dependence on renal dialysis: Secondary | ICD-10-CM | POA: Diagnosis not present

## 2021-01-27 DIAGNOSIS — N186 End stage renal disease: Secondary | ICD-10-CM | POA: Diagnosis not present

## 2021-01-27 DIAGNOSIS — N2581 Secondary hyperparathyroidism of renal origin: Secondary | ICD-10-CM | POA: Diagnosis not present

## 2021-01-27 DIAGNOSIS — D509 Iron deficiency anemia, unspecified: Secondary | ICD-10-CM | POA: Diagnosis not present

## 2021-01-27 DIAGNOSIS — Z111 Encounter for screening for respiratory tuberculosis: Secondary | ICD-10-CM | POA: Diagnosis not present

## 2021-01-27 DIAGNOSIS — E1122 Type 2 diabetes mellitus with diabetic chronic kidney disease: Secondary | ICD-10-CM | POA: Diagnosis not present

## 2021-01-27 DIAGNOSIS — D631 Anemia in chronic kidney disease: Secondary | ICD-10-CM | POA: Diagnosis not present

## 2021-01-27 DIAGNOSIS — D689 Coagulation defect, unspecified: Secondary | ICD-10-CM | POA: Diagnosis not present

## 2021-01-29 DIAGNOSIS — D689 Coagulation defect, unspecified: Secondary | ICD-10-CM | POA: Diagnosis not present

## 2021-01-29 DIAGNOSIS — D631 Anemia in chronic kidney disease: Secondary | ICD-10-CM | POA: Diagnosis not present

## 2021-01-29 DIAGNOSIS — E1122 Type 2 diabetes mellitus with diabetic chronic kidney disease: Secondary | ICD-10-CM | POA: Diagnosis not present

## 2021-01-29 DIAGNOSIS — N186 End stage renal disease: Secondary | ICD-10-CM | POA: Diagnosis not present

## 2021-01-29 DIAGNOSIS — Z992 Dependence on renal dialysis: Secondary | ICD-10-CM | POA: Diagnosis not present

## 2021-01-29 DIAGNOSIS — N2581 Secondary hyperparathyroidism of renal origin: Secondary | ICD-10-CM | POA: Diagnosis not present

## 2021-01-29 DIAGNOSIS — D509 Iron deficiency anemia, unspecified: Secondary | ICD-10-CM | POA: Diagnosis not present

## 2021-01-29 DIAGNOSIS — Z111 Encounter for screening for respiratory tuberculosis: Secondary | ICD-10-CM | POA: Diagnosis not present

## 2021-02-01 DIAGNOSIS — S91109D Unspecified open wound of unspecified toe(s) without damage to nail, subsequent encounter: Secondary | ICD-10-CM | POA: Diagnosis not present

## 2021-02-01 DIAGNOSIS — S91301D Unspecified open wound, right foot, subsequent encounter: Secondary | ICD-10-CM | POA: Diagnosis not present

## 2021-02-01 DIAGNOSIS — E119 Type 2 diabetes mellitus without complications: Secondary | ICD-10-CM | POA: Diagnosis not present

## 2021-02-01 DIAGNOSIS — M6281 Muscle weakness (generalized): Secondary | ICD-10-CM | POA: Diagnosis not present

## 2021-02-02 DIAGNOSIS — I10 Essential (primary) hypertension: Secondary | ICD-10-CM | POA: Diagnosis not present

## 2021-02-02 DIAGNOSIS — Z794 Long term (current) use of insulin: Secondary | ICD-10-CM | POA: Diagnosis not present

## 2021-02-02 DIAGNOSIS — N186 End stage renal disease: Secondary | ICD-10-CM | POA: Diagnosis not present

## 2021-02-02 DIAGNOSIS — E1322 Other specified diabetes mellitus with diabetic chronic kidney disease: Secondary | ICD-10-CM | POA: Diagnosis not present

## 2021-02-02 DIAGNOSIS — E1165 Type 2 diabetes mellitus with hyperglycemia: Secondary | ICD-10-CM | POA: Diagnosis not present

## 2021-02-02 DIAGNOSIS — E1142 Type 2 diabetes mellitus with diabetic polyneuropathy: Secondary | ICD-10-CM | POA: Diagnosis not present

## 2021-02-02 DIAGNOSIS — M792 Neuralgia and neuritis, unspecified: Secondary | ICD-10-CM | POA: Diagnosis not present

## 2021-02-02 DIAGNOSIS — Z8679 Personal history of other diseases of the circulatory system: Secondary | ICD-10-CM | POA: Diagnosis not present

## 2021-02-02 DIAGNOSIS — M869 Osteomyelitis, unspecified: Secondary | ICD-10-CM | POA: Diagnosis not present

## 2021-02-02 DIAGNOSIS — Z741 Need for assistance with personal care: Secondary | ICD-10-CM | POA: Diagnosis not present

## 2021-02-02 DIAGNOSIS — R29898 Other symptoms and signs involving the musculoskeletal system: Secondary | ICD-10-CM | POA: Diagnosis not present

## 2021-02-03 DIAGNOSIS — Z992 Dependence on renal dialysis: Secondary | ICD-10-CM | POA: Diagnosis not present

## 2021-02-03 DIAGNOSIS — N186 End stage renal disease: Secondary | ICD-10-CM | POA: Diagnosis not present

## 2021-02-03 DIAGNOSIS — D689 Coagulation defect, unspecified: Secondary | ICD-10-CM | POA: Diagnosis not present

## 2021-02-03 DIAGNOSIS — D509 Iron deficiency anemia, unspecified: Secondary | ICD-10-CM | POA: Diagnosis not present

## 2021-02-03 DIAGNOSIS — N2581 Secondary hyperparathyroidism of renal origin: Secondary | ICD-10-CM | POA: Diagnosis not present

## 2021-02-05 DIAGNOSIS — D509 Iron deficiency anemia, unspecified: Secondary | ICD-10-CM | POA: Diagnosis not present

## 2021-02-05 DIAGNOSIS — D689 Coagulation defect, unspecified: Secondary | ICD-10-CM | POA: Diagnosis not present

## 2021-02-05 DIAGNOSIS — N2581 Secondary hyperparathyroidism of renal origin: Secondary | ICD-10-CM | POA: Diagnosis not present

## 2021-02-05 DIAGNOSIS — Z992 Dependence on renal dialysis: Secondary | ICD-10-CM | POA: Diagnosis not present

## 2021-02-05 DIAGNOSIS — N186 End stage renal disease: Secondary | ICD-10-CM | POA: Diagnosis not present

## 2021-02-08 DIAGNOSIS — N2581 Secondary hyperparathyroidism of renal origin: Secondary | ICD-10-CM | POA: Diagnosis not present

## 2021-02-08 DIAGNOSIS — S91101D Unspecified open wound of right great toe without damage to nail, subsequent encounter: Secondary | ICD-10-CM | POA: Diagnosis not present

## 2021-02-08 DIAGNOSIS — N186 End stage renal disease: Secondary | ICD-10-CM | POA: Diagnosis not present

## 2021-02-08 DIAGNOSIS — D631 Anemia in chronic kidney disease: Secondary | ICD-10-CM | POA: Diagnosis not present

## 2021-02-08 DIAGNOSIS — S91109D Unspecified open wound of unspecified toe(s) without damage to nail, subsequent encounter: Secondary | ICD-10-CM | POA: Diagnosis not present

## 2021-02-08 DIAGNOSIS — E119 Type 2 diabetes mellitus without complications: Secondary | ICD-10-CM | POA: Diagnosis not present

## 2021-02-08 DIAGNOSIS — D509 Iron deficiency anemia, unspecified: Secondary | ICD-10-CM | POA: Diagnosis not present

## 2021-02-08 DIAGNOSIS — Z992 Dependence on renal dialysis: Secondary | ICD-10-CM | POA: Diagnosis not present

## 2021-02-08 DIAGNOSIS — M6281 Muscle weakness (generalized): Secondary | ICD-10-CM | POA: Diagnosis not present

## 2021-02-08 DIAGNOSIS — S91301D Unspecified open wound, right foot, subsequent encounter: Secondary | ICD-10-CM | POA: Diagnosis not present

## 2021-02-08 DIAGNOSIS — D689 Coagulation defect, unspecified: Secondary | ICD-10-CM | POA: Diagnosis not present

## 2021-02-09 DIAGNOSIS — Z8679 Personal history of other diseases of the circulatory system: Secondary | ICD-10-CM | POA: Diagnosis not present

## 2021-02-09 DIAGNOSIS — R29898 Other symptoms and signs involving the musculoskeletal system: Secondary | ICD-10-CM | POA: Diagnosis not present

## 2021-02-09 DIAGNOSIS — E1322 Other specified diabetes mellitus with diabetic chronic kidney disease: Secondary | ICD-10-CM | POA: Diagnosis not present

## 2021-02-09 DIAGNOSIS — M869 Osteomyelitis, unspecified: Secondary | ICD-10-CM | POA: Diagnosis not present

## 2021-02-09 DIAGNOSIS — E119 Type 2 diabetes mellitus without complications: Secondary | ICD-10-CM | POA: Diagnosis not present

## 2021-02-09 DIAGNOSIS — Z794 Long term (current) use of insulin: Secondary | ICD-10-CM | POA: Diagnosis not present

## 2021-02-09 DIAGNOSIS — R2689 Other abnormalities of gait and mobility: Secondary | ICD-10-CM | POA: Diagnosis not present

## 2021-02-09 DIAGNOSIS — Z741 Need for assistance with personal care: Secondary | ICD-10-CM | POA: Diagnosis not present

## 2021-02-09 DIAGNOSIS — N186 End stage renal disease: Secondary | ICD-10-CM | POA: Diagnosis not present

## 2021-02-09 DIAGNOSIS — I10 Essential (primary) hypertension: Secondary | ICD-10-CM | POA: Diagnosis not present

## 2021-02-09 DIAGNOSIS — T82858A Stenosis of vascular prosthetic devices, implants and grafts, initial encounter: Secondary | ICD-10-CM | POA: Diagnosis not present

## 2021-02-09 DIAGNOSIS — E1142 Type 2 diabetes mellitus with diabetic polyneuropathy: Secondary | ICD-10-CM | POA: Diagnosis not present

## 2021-02-09 DIAGNOSIS — E1165 Type 2 diabetes mellitus with hyperglycemia: Secondary | ICD-10-CM | POA: Diagnosis not present

## 2021-02-09 DIAGNOSIS — M792 Neuralgia and neuritis, unspecified: Secondary | ICD-10-CM | POA: Diagnosis not present

## 2021-02-10 DIAGNOSIS — Z992 Dependence on renal dialysis: Secondary | ICD-10-CM | POA: Diagnosis not present

## 2021-02-10 DIAGNOSIS — N186 End stage renal disease: Secondary | ICD-10-CM | POA: Diagnosis not present

## 2021-02-10 DIAGNOSIS — D689 Coagulation defect, unspecified: Secondary | ICD-10-CM | POA: Diagnosis not present

## 2021-02-10 DIAGNOSIS — N2581 Secondary hyperparathyroidism of renal origin: Secondary | ICD-10-CM | POA: Diagnosis not present

## 2021-02-10 DIAGNOSIS — D509 Iron deficiency anemia, unspecified: Secondary | ICD-10-CM | POA: Diagnosis not present

## 2021-02-10 DIAGNOSIS — D631 Anemia in chronic kidney disease: Secondary | ICD-10-CM | POA: Diagnosis not present

## 2021-02-12 DIAGNOSIS — D689 Coagulation defect, unspecified: Secondary | ICD-10-CM | POA: Diagnosis not present

## 2021-02-12 DIAGNOSIS — N186 End stage renal disease: Secondary | ICD-10-CM | POA: Diagnosis not present

## 2021-02-12 DIAGNOSIS — D509 Iron deficiency anemia, unspecified: Secondary | ICD-10-CM | POA: Diagnosis not present

## 2021-02-12 DIAGNOSIS — Z992 Dependence on renal dialysis: Secondary | ICD-10-CM | POA: Diagnosis not present

## 2021-02-12 DIAGNOSIS — D631 Anemia in chronic kidney disease: Secondary | ICD-10-CM | POA: Diagnosis not present

## 2021-02-12 DIAGNOSIS — N2581 Secondary hyperparathyroidism of renal origin: Secondary | ICD-10-CM | POA: Diagnosis not present

## 2021-02-14 DIAGNOSIS — M86679 Other chronic osteomyelitis, unspecified ankle and foot: Secondary | ICD-10-CM | POA: Diagnosis not present

## 2021-02-14 DIAGNOSIS — L97508 Non-pressure chronic ulcer of other part of unspecified foot with other specified severity: Secondary | ICD-10-CM | POA: Diagnosis not present

## 2021-02-14 DIAGNOSIS — Z91199 Patient's noncompliance with other medical treatment and regimen due to unspecified reason: Secondary | ICD-10-CM | POA: Diagnosis not present

## 2021-02-14 DIAGNOSIS — N184 Chronic kidney disease, stage 4 (severe): Secondary | ICD-10-CM | POA: Diagnosis not present

## 2021-02-14 DIAGNOSIS — Z89421 Acquired absence of other right toe(s): Secondary | ICD-10-CM | POA: Diagnosis not present

## 2021-02-14 DIAGNOSIS — Z794 Long term (current) use of insulin: Secondary | ICD-10-CM | POA: Diagnosis not present

## 2021-02-14 DIAGNOSIS — Z4781 Encounter for orthopedic aftercare following surgical amputation: Secondary | ICD-10-CM | POA: Diagnosis not present

## 2021-02-14 DIAGNOSIS — L97519 Non-pressure chronic ulcer of other part of right foot with unspecified severity: Secondary | ICD-10-CM | POA: Diagnosis not present

## 2021-02-14 DIAGNOSIS — E11621 Type 2 diabetes mellitus with foot ulcer: Secondary | ICD-10-CM | POA: Diagnosis not present

## 2021-02-14 DIAGNOSIS — E119 Type 2 diabetes mellitus without complications: Secondary | ICD-10-CM | POA: Diagnosis not present

## 2021-02-14 DIAGNOSIS — E1142 Type 2 diabetes mellitus with diabetic polyneuropathy: Secondary | ICD-10-CM | POA: Diagnosis not present

## 2021-02-14 DIAGNOSIS — L89619 Pressure ulcer of right heel, unspecified stage: Secondary | ICD-10-CM | POA: Diagnosis not present

## 2021-02-15 DIAGNOSIS — D689 Coagulation defect, unspecified: Secondary | ICD-10-CM | POA: Diagnosis not present

## 2021-02-15 DIAGNOSIS — D509 Iron deficiency anemia, unspecified: Secondary | ICD-10-CM | POA: Diagnosis not present

## 2021-02-15 DIAGNOSIS — S91301D Unspecified open wound, right foot, subsequent encounter: Secondary | ICD-10-CM | POA: Diagnosis not present

## 2021-02-15 DIAGNOSIS — M6281 Muscle weakness (generalized): Secondary | ICD-10-CM | POA: Diagnosis not present

## 2021-02-15 DIAGNOSIS — S91109D Unspecified open wound of unspecified toe(s) without damage to nail, subsequent encounter: Secondary | ICD-10-CM | POA: Diagnosis not present

## 2021-02-15 DIAGNOSIS — N2581 Secondary hyperparathyroidism of renal origin: Secondary | ICD-10-CM | POA: Diagnosis not present

## 2021-02-15 DIAGNOSIS — E119 Type 2 diabetes mellitus without complications: Secondary | ICD-10-CM | POA: Diagnosis not present

## 2021-02-15 DIAGNOSIS — Z992 Dependence on renal dialysis: Secondary | ICD-10-CM | POA: Diagnosis not present

## 2021-02-15 DIAGNOSIS — N186 End stage renal disease: Secondary | ICD-10-CM | POA: Diagnosis not present

## 2021-02-16 DIAGNOSIS — M869 Osteomyelitis, unspecified: Secondary | ICD-10-CM | POA: Diagnosis not present

## 2021-02-16 DIAGNOSIS — R2689 Other abnormalities of gait and mobility: Secondary | ICD-10-CM | POA: Diagnosis not present

## 2021-02-16 DIAGNOSIS — E119 Type 2 diabetes mellitus without complications: Secondary | ICD-10-CM | POA: Diagnosis not present

## 2021-02-16 DIAGNOSIS — N186 End stage renal disease: Secondary | ICD-10-CM | POA: Diagnosis not present

## 2021-02-17 DIAGNOSIS — D509 Iron deficiency anemia, unspecified: Secondary | ICD-10-CM | POA: Diagnosis not present

## 2021-02-17 DIAGNOSIS — D689 Coagulation defect, unspecified: Secondary | ICD-10-CM | POA: Diagnosis not present

## 2021-02-17 DIAGNOSIS — Z992 Dependence on renal dialysis: Secondary | ICD-10-CM | POA: Diagnosis not present

## 2021-02-17 DIAGNOSIS — N2581 Secondary hyperparathyroidism of renal origin: Secondary | ICD-10-CM | POA: Diagnosis not present

## 2021-02-17 DIAGNOSIS — N186 End stage renal disease: Secondary | ICD-10-CM | POA: Diagnosis not present

## 2021-02-19 DIAGNOSIS — N2581 Secondary hyperparathyroidism of renal origin: Secondary | ICD-10-CM | POA: Diagnosis not present

## 2021-02-19 DIAGNOSIS — D509 Iron deficiency anemia, unspecified: Secondary | ICD-10-CM | POA: Diagnosis not present

## 2021-02-19 DIAGNOSIS — D689 Coagulation defect, unspecified: Secondary | ICD-10-CM | POA: Diagnosis not present

## 2021-02-19 DIAGNOSIS — Z992 Dependence on renal dialysis: Secondary | ICD-10-CM | POA: Diagnosis not present

## 2021-02-19 DIAGNOSIS — N186 End stage renal disease: Secondary | ICD-10-CM | POA: Diagnosis not present

## 2021-02-22 DIAGNOSIS — L89612 Pressure ulcer of right heel, stage 2: Secondary | ICD-10-CM | POA: Diagnosis not present

## 2021-02-22 DIAGNOSIS — E119 Type 2 diabetes mellitus without complications: Secondary | ICD-10-CM | POA: Diagnosis not present

## 2021-02-22 DIAGNOSIS — S91109D Unspecified open wound of unspecified toe(s) without damage to nail, subsequent encounter: Secondary | ICD-10-CM | POA: Diagnosis not present

## 2021-02-22 DIAGNOSIS — D509 Iron deficiency anemia, unspecified: Secondary | ICD-10-CM | POA: Diagnosis not present

## 2021-02-22 DIAGNOSIS — Z992 Dependence on renal dialysis: Secondary | ICD-10-CM | POA: Diagnosis not present

## 2021-02-22 DIAGNOSIS — N186 End stage renal disease: Secondary | ICD-10-CM | POA: Diagnosis not present

## 2021-02-22 DIAGNOSIS — N2581 Secondary hyperparathyroidism of renal origin: Secondary | ICD-10-CM | POA: Diagnosis not present

## 2021-02-22 DIAGNOSIS — D689 Coagulation defect, unspecified: Secondary | ICD-10-CM | POA: Diagnosis not present

## 2021-02-22 DIAGNOSIS — D631 Anemia in chronic kidney disease: Secondary | ICD-10-CM | POA: Diagnosis not present

## 2021-02-22 DIAGNOSIS — S91301D Unspecified open wound, right foot, subsequent encounter: Secondary | ICD-10-CM | POA: Diagnosis not present

## 2021-02-22 DIAGNOSIS — M6281 Muscle weakness (generalized): Secondary | ICD-10-CM | POA: Diagnosis not present

## 2021-02-24 DIAGNOSIS — D631 Anemia in chronic kidney disease: Secondary | ICD-10-CM | POA: Diagnosis not present

## 2021-02-24 DIAGNOSIS — D509 Iron deficiency anemia, unspecified: Secondary | ICD-10-CM | POA: Diagnosis not present

## 2021-02-24 DIAGNOSIS — H2513 Age-related nuclear cataract, bilateral: Secondary | ICD-10-CM | POA: Diagnosis not present

## 2021-02-24 DIAGNOSIS — N186 End stage renal disease: Secondary | ICD-10-CM | POA: Diagnosis not present

## 2021-02-24 DIAGNOSIS — Z992 Dependence on renal dialysis: Secondary | ICD-10-CM | POA: Diagnosis not present

## 2021-02-24 DIAGNOSIS — H35033 Hypertensive retinopathy, bilateral: Secondary | ICD-10-CM | POA: Diagnosis not present

## 2021-02-24 DIAGNOSIS — N2581 Secondary hyperparathyroidism of renal origin: Secondary | ICD-10-CM | POA: Diagnosis not present

## 2021-02-24 DIAGNOSIS — D689 Coagulation defect, unspecified: Secondary | ICD-10-CM | POA: Diagnosis not present

## 2021-02-24 DIAGNOSIS — E119 Type 2 diabetes mellitus without complications: Secondary | ICD-10-CM | POA: Diagnosis not present

## 2021-02-25 DIAGNOSIS — M86371 Chronic multifocal osteomyelitis, right ankle and foot: Secondary | ICD-10-CM | POA: Diagnosis not present

## 2021-02-25 DIAGNOSIS — N184 Chronic kidney disease, stage 4 (severe): Secondary | ICD-10-CM | POA: Diagnosis not present

## 2021-02-25 DIAGNOSIS — Z792 Long term (current) use of antibiotics: Secondary | ICD-10-CM | POA: Diagnosis not present

## 2021-02-25 DIAGNOSIS — E119 Type 2 diabetes mellitus without complications: Secondary | ICD-10-CM | POA: Diagnosis not present

## 2021-02-25 DIAGNOSIS — E11621 Type 2 diabetes mellitus with foot ulcer: Secondary | ICD-10-CM | POA: Diagnosis not present

## 2021-02-25 DIAGNOSIS — L97508 Non-pressure chronic ulcer of other part of unspecified foot with other specified severity: Secondary | ICD-10-CM | POA: Diagnosis not present

## 2021-02-25 DIAGNOSIS — M86679 Other chronic osteomyelitis, unspecified ankle and foot: Secondary | ICD-10-CM | POA: Diagnosis not present

## 2021-02-26 DIAGNOSIS — Z992 Dependence on renal dialysis: Secondary | ICD-10-CM | POA: Diagnosis not present

## 2021-02-26 DIAGNOSIS — D689 Coagulation defect, unspecified: Secondary | ICD-10-CM | POA: Diagnosis not present

## 2021-02-26 DIAGNOSIS — D631 Anemia in chronic kidney disease: Secondary | ICD-10-CM | POA: Diagnosis not present

## 2021-02-26 DIAGNOSIS — N186 End stage renal disease: Secondary | ICD-10-CM | POA: Diagnosis not present

## 2021-02-26 DIAGNOSIS — D509 Iron deficiency anemia, unspecified: Secondary | ICD-10-CM | POA: Diagnosis not present

## 2021-02-26 DIAGNOSIS — N2581 Secondary hyperparathyroidism of renal origin: Secondary | ICD-10-CM | POA: Diagnosis not present

## 2021-02-28 DIAGNOSIS — E875 Hyperkalemia: Secondary | ICD-10-CM | POA: Diagnosis not present

## 2021-02-28 DIAGNOSIS — E11621 Type 2 diabetes mellitus with foot ulcer: Secondary | ICD-10-CM | POA: Diagnosis not present

## 2021-02-28 DIAGNOSIS — M6281 Muscle weakness (generalized): Secondary | ICD-10-CM | POA: Diagnosis not present

## 2021-02-28 DIAGNOSIS — E119 Type 2 diabetes mellitus without complications: Secondary | ICD-10-CM | POA: Diagnosis not present

## 2021-02-28 DIAGNOSIS — S91109D Unspecified open wound of unspecified toe(s) without damage to nail, subsequent encounter: Secondary | ICD-10-CM | POA: Diagnosis not present

## 2021-02-28 DIAGNOSIS — S91301D Unspecified open wound, right foot, subsequent encounter: Secondary | ICD-10-CM | POA: Diagnosis not present

## 2021-02-28 DIAGNOSIS — Z89421 Acquired absence of other right toe(s): Secondary | ICD-10-CM | POA: Diagnosis not present

## 2021-02-28 DIAGNOSIS — L97519 Non-pressure chronic ulcer of other part of right foot with unspecified severity: Secondary | ICD-10-CM | POA: Diagnosis not present

## 2021-02-28 DIAGNOSIS — Z794 Long term (current) use of insulin: Secondary | ICD-10-CM | POA: Diagnosis not present

## 2021-02-28 DIAGNOSIS — L89612 Pressure ulcer of right heel, stage 2: Secondary | ICD-10-CM | POA: Diagnosis not present

## 2021-02-28 DIAGNOSIS — E1142 Type 2 diabetes mellitus with diabetic polyneuropathy: Secondary | ICD-10-CM | POA: Diagnosis not present

## 2021-02-28 DIAGNOSIS — L97419 Non-pressure chronic ulcer of right heel and midfoot with unspecified severity: Secondary | ICD-10-CM | POA: Diagnosis not present

## 2021-03-01 DIAGNOSIS — I739 Peripheral vascular disease, unspecified: Secondary | ICD-10-CM | POA: Diagnosis not present

## 2021-03-01 DIAGNOSIS — D509 Iron deficiency anemia, unspecified: Secondary | ICD-10-CM | POA: Diagnosis not present

## 2021-03-01 DIAGNOSIS — N2581 Secondary hyperparathyroidism of renal origin: Secondary | ICD-10-CM | POA: Diagnosis not present

## 2021-03-01 DIAGNOSIS — N186 End stage renal disease: Secondary | ICD-10-CM | POA: Diagnosis not present

## 2021-03-01 DIAGNOSIS — M79604 Pain in right leg: Secondary | ICD-10-CM | POA: Diagnosis not present

## 2021-03-01 DIAGNOSIS — D689 Coagulation defect, unspecified: Secondary | ICD-10-CM | POA: Diagnosis not present

## 2021-03-01 DIAGNOSIS — Z992 Dependence on renal dialysis: Secondary | ICD-10-CM | POA: Diagnosis not present

## 2021-03-01 DIAGNOSIS — M79605 Pain in left leg: Secondary | ICD-10-CM | POA: Diagnosis not present

## 2021-03-02 DIAGNOSIS — R5383 Other fatigue: Secondary | ICD-10-CM | POA: Diagnosis not present

## 2021-03-02 DIAGNOSIS — Z20822 Contact with and (suspected) exposure to covid-19: Secondary | ICD-10-CM | POA: Diagnosis not present

## 2021-03-02 DIAGNOSIS — R2689 Other abnormalities of gait and mobility: Secondary | ICD-10-CM | POA: Diagnosis not present

## 2021-03-02 DIAGNOSIS — M869 Osteomyelitis, unspecified: Secondary | ICD-10-CM | POA: Diagnosis not present

## 2021-03-02 DIAGNOSIS — D649 Anemia, unspecified: Secondary | ICD-10-CM | POA: Diagnosis not present

## 2021-03-03 DIAGNOSIS — E119 Type 2 diabetes mellitus without complications: Secondary | ICD-10-CM | POA: Diagnosis not present

## 2021-03-03 DIAGNOSIS — I480 Paroxysmal atrial fibrillation: Secondary | ICD-10-CM | POA: Diagnosis not present

## 2021-03-03 DIAGNOSIS — L97919 Non-pressure chronic ulcer of unspecified part of right lower leg with unspecified severity: Secondary | ICD-10-CM | POA: Diagnosis not present

## 2021-03-03 DIAGNOSIS — I70239 Atherosclerosis of native arteries of right leg with ulceration of unspecified site: Secondary | ICD-10-CM | POA: Diagnosis not present

## 2021-03-03 DIAGNOSIS — Z89421 Acquired absence of other right toe(s): Secondary | ICD-10-CM | POA: Diagnosis not present

## 2021-03-03 DIAGNOSIS — Z89411 Acquired absence of right great toe: Secondary | ICD-10-CM | POA: Diagnosis not present

## 2021-03-03 DIAGNOSIS — M869 Osteomyelitis, unspecified: Secondary | ICD-10-CM | POA: Diagnosis not present

## 2021-03-03 DIAGNOSIS — F1290 Cannabis use, unspecified, uncomplicated: Secondary | ICD-10-CM | POA: Diagnosis not present

## 2021-03-03 DIAGNOSIS — Z87891 Personal history of nicotine dependence: Secondary | ICD-10-CM | POA: Diagnosis not present

## 2021-03-03 DIAGNOSIS — I70261 Atherosclerosis of native arteries of extremities with gangrene, right leg: Secondary | ICD-10-CM | POA: Diagnosis not present

## 2021-03-03 DIAGNOSIS — E1142 Type 2 diabetes mellitus with diabetic polyneuropathy: Secondary | ICD-10-CM | POA: Diagnosis not present

## 2021-03-03 DIAGNOSIS — I70221 Atherosclerosis of native arteries of extremities with rest pain, right leg: Secondary | ICD-10-CM | POA: Diagnosis not present

## 2021-03-03 DIAGNOSIS — D631 Anemia in chronic kidney disease: Secondary | ICD-10-CM | POA: Diagnosis not present

## 2021-03-03 DIAGNOSIS — E1165 Type 2 diabetes mellitus with hyperglycemia: Secondary | ICD-10-CM | POA: Diagnosis not present

## 2021-03-03 DIAGNOSIS — Z7984 Long term (current) use of oral hypoglycemic drugs: Secondary | ICD-10-CM | POA: Diagnosis not present

## 2021-03-03 DIAGNOSIS — Z794 Long term (current) use of insulin: Secondary | ICD-10-CM | POA: Diagnosis not present

## 2021-03-03 DIAGNOSIS — E1151 Type 2 diabetes mellitus with diabetic peripheral angiopathy without gangrene: Secondary | ICD-10-CM | POA: Diagnosis not present

## 2021-03-03 DIAGNOSIS — I120 Hypertensive chronic kidney disease with stage 5 chronic kidney disease or end stage renal disease: Secondary | ICD-10-CM | POA: Diagnosis not present

## 2021-03-03 DIAGNOSIS — I132 Hypertensive heart and chronic kidney disease with heart failure and with stage 5 chronic kidney disease, or end stage renal disease: Secondary | ICD-10-CM | POA: Diagnosis not present

## 2021-03-03 DIAGNOSIS — Z01818 Encounter for other preprocedural examination: Secondary | ICD-10-CM | POA: Diagnosis not present

## 2021-03-03 DIAGNOSIS — L03113 Cellulitis of right upper limb: Secondary | ICD-10-CM | POA: Diagnosis not present

## 2021-03-03 DIAGNOSIS — I739 Peripheral vascular disease, unspecified: Secondary | ICD-10-CM | POA: Diagnosis not present

## 2021-03-03 DIAGNOSIS — T8789 Other complications of amputation stump: Secondary | ICD-10-CM | POA: Diagnosis not present

## 2021-03-03 DIAGNOSIS — Z9889 Other specified postprocedural states: Secondary | ICD-10-CM | POA: Diagnosis not present

## 2021-03-03 DIAGNOSIS — I1311 Hypertensive heart and chronic kidney disease without heart failure, with stage 5 chronic kidney disease, or end stage renal disease: Secondary | ICD-10-CM | POA: Diagnosis not present

## 2021-03-03 DIAGNOSIS — B182 Chronic viral hepatitis C: Secondary | ICD-10-CM | POA: Diagnosis not present

## 2021-03-03 DIAGNOSIS — I5032 Chronic diastolic (congestive) heart failure: Secondary | ICD-10-CM | POA: Diagnosis not present

## 2021-03-03 DIAGNOSIS — E1122 Type 2 diabetes mellitus with diabetic chronic kidney disease: Secondary | ICD-10-CM | POA: Diagnosis not present

## 2021-03-03 DIAGNOSIS — N186 End stage renal disease: Secondary | ICD-10-CM | POA: Diagnosis not present

## 2021-03-03 DIAGNOSIS — I10 Essential (primary) hypertension: Secondary | ICD-10-CM | POA: Diagnosis not present

## 2021-03-03 DIAGNOSIS — Z7982 Long term (current) use of aspirin: Secondary | ICD-10-CM | POA: Diagnosis not present

## 2021-03-03 DIAGNOSIS — Z992 Dependence on renal dialysis: Secondary | ICD-10-CM | POA: Diagnosis not present

## 2021-03-03 DIAGNOSIS — I96 Gangrene, not elsewhere classified: Secondary | ICD-10-CM | POA: Diagnosis not present

## 2021-03-04 DIAGNOSIS — E1122 Type 2 diabetes mellitus with diabetic chronic kidney disease: Secondary | ICD-10-CM | POA: Diagnosis not present

## 2021-03-04 DIAGNOSIS — Z87891 Personal history of nicotine dependence: Secondary | ICD-10-CM | POA: Diagnosis not present

## 2021-03-04 DIAGNOSIS — Z992 Dependence on renal dialysis: Secondary | ICD-10-CM | POA: Diagnosis not present

## 2021-03-04 DIAGNOSIS — N186 End stage renal disease: Secondary | ICD-10-CM | POA: Diagnosis not present

## 2021-03-04 DIAGNOSIS — Z7984 Long term (current) use of oral hypoglycemic drugs: Secondary | ICD-10-CM | POA: Diagnosis not present

## 2021-03-04 DIAGNOSIS — Z01818 Encounter for other preprocedural examination: Secondary | ICD-10-CM | POA: Diagnosis not present

## 2021-03-04 DIAGNOSIS — I1311 Hypertensive heart and chronic kidney disease without heart failure, with stage 5 chronic kidney disease, or end stage renal disease: Secondary | ICD-10-CM | POA: Diagnosis not present

## 2021-03-05 DIAGNOSIS — M869 Osteomyelitis, unspecified: Secondary | ICD-10-CM | POA: Diagnosis not present

## 2021-03-05 DIAGNOSIS — E1122 Type 2 diabetes mellitus with diabetic chronic kidney disease: Secondary | ICD-10-CM | POA: Diagnosis not present

## 2021-03-05 DIAGNOSIS — Z992 Dependence on renal dialysis: Secondary | ICD-10-CM | POA: Diagnosis not present

## 2021-03-05 DIAGNOSIS — N186 End stage renal disease: Secondary | ICD-10-CM | POA: Diagnosis not present

## 2021-03-05 DIAGNOSIS — Z7984 Long term (current) use of oral hypoglycemic drugs: Secondary | ICD-10-CM | POA: Diagnosis not present

## 2021-03-05 DIAGNOSIS — I120 Hypertensive chronic kidney disease with stage 5 chronic kidney disease or end stage renal disease: Secondary | ICD-10-CM | POA: Diagnosis not present

## 2021-03-05 DIAGNOSIS — L97919 Non-pressure chronic ulcer of unspecified part of right lower leg with unspecified severity: Secondary | ICD-10-CM | POA: Diagnosis not present

## 2021-03-05 DIAGNOSIS — E119 Type 2 diabetes mellitus without complications: Secondary | ICD-10-CM | POA: Diagnosis not present

## 2021-03-05 DIAGNOSIS — I739 Peripheral vascular disease, unspecified: Secondary | ICD-10-CM | POA: Diagnosis not present

## 2021-03-05 DIAGNOSIS — I70239 Atherosclerosis of native arteries of right leg with ulceration of unspecified site: Secondary | ICD-10-CM | POA: Diagnosis not present

## 2021-03-06 DIAGNOSIS — N186 End stage renal disease: Secondary | ICD-10-CM | POA: Diagnosis not present

## 2021-03-06 DIAGNOSIS — E1122 Type 2 diabetes mellitus with diabetic chronic kidney disease: Secondary | ICD-10-CM | POA: Diagnosis not present

## 2021-03-06 DIAGNOSIS — Z992 Dependence on renal dialysis: Secondary | ICD-10-CM | POA: Diagnosis not present

## 2021-03-06 DIAGNOSIS — Z7984 Long term (current) use of oral hypoglycemic drugs: Secondary | ICD-10-CM | POA: Diagnosis not present

## 2021-03-06 DIAGNOSIS — I120 Hypertensive chronic kidney disease with stage 5 chronic kidney disease or end stage renal disease: Secondary | ICD-10-CM | POA: Diagnosis not present

## 2021-03-07 DIAGNOSIS — I120 Hypertensive chronic kidney disease with stage 5 chronic kidney disease or end stage renal disease: Secondary | ICD-10-CM | POA: Diagnosis not present

## 2021-03-07 DIAGNOSIS — Z992 Dependence on renal dialysis: Secondary | ICD-10-CM | POA: Diagnosis not present

## 2021-03-07 DIAGNOSIS — E1122 Type 2 diabetes mellitus with diabetic chronic kidney disease: Secondary | ICD-10-CM | POA: Diagnosis not present

## 2021-03-07 DIAGNOSIS — N186 End stage renal disease: Secondary | ICD-10-CM | POA: Diagnosis not present

## 2021-03-07 DIAGNOSIS — Z7984 Long term (current) use of oral hypoglycemic drugs: Secondary | ICD-10-CM | POA: Diagnosis not present

## 2021-03-08 DIAGNOSIS — I120 Hypertensive chronic kidney disease with stage 5 chronic kidney disease or end stage renal disease: Secondary | ICD-10-CM | POA: Diagnosis not present

## 2021-03-08 DIAGNOSIS — Z992 Dependence on renal dialysis: Secondary | ICD-10-CM | POA: Diagnosis not present

## 2021-03-08 DIAGNOSIS — I96 Gangrene, not elsewhere classified: Secondary | ICD-10-CM | POA: Diagnosis not present

## 2021-03-08 DIAGNOSIS — N186 End stage renal disease: Secondary | ICD-10-CM | POA: Diagnosis not present

## 2021-03-08 DIAGNOSIS — Z7984 Long term (current) use of oral hypoglycemic drugs: Secondary | ICD-10-CM | POA: Diagnosis not present

## 2021-03-08 DIAGNOSIS — E1122 Type 2 diabetes mellitus with diabetic chronic kidney disease: Secondary | ICD-10-CM | POA: Diagnosis not present

## 2021-03-09 DIAGNOSIS — Z7984 Long term (current) use of oral hypoglycemic drugs: Secondary | ICD-10-CM | POA: Diagnosis not present

## 2021-03-09 DIAGNOSIS — N186 End stage renal disease: Secondary | ICD-10-CM | POA: Diagnosis not present

## 2021-03-09 DIAGNOSIS — E1122 Type 2 diabetes mellitus with diabetic chronic kidney disease: Secondary | ICD-10-CM | POA: Diagnosis not present

## 2021-03-09 DIAGNOSIS — Z992 Dependence on renal dialysis: Secondary | ICD-10-CM | POA: Diagnosis not present

## 2021-03-09 DIAGNOSIS — I120 Hypertensive chronic kidney disease with stage 5 chronic kidney disease or end stage renal disease: Secondary | ICD-10-CM | POA: Diagnosis not present

## 2021-03-10 DIAGNOSIS — Z7984 Long term (current) use of oral hypoglycemic drugs: Secondary | ICD-10-CM | POA: Diagnosis not present

## 2021-03-10 DIAGNOSIS — E1122 Type 2 diabetes mellitus with diabetic chronic kidney disease: Secondary | ICD-10-CM | POA: Diagnosis not present

## 2021-03-10 DIAGNOSIS — Z992 Dependence on renal dialysis: Secondary | ICD-10-CM | POA: Diagnosis not present

## 2021-03-10 DIAGNOSIS — I120 Hypertensive chronic kidney disease with stage 5 chronic kidney disease or end stage renal disease: Secondary | ICD-10-CM | POA: Diagnosis not present

## 2021-03-10 DIAGNOSIS — N186 End stage renal disease: Secondary | ICD-10-CM | POA: Diagnosis not present

## 2021-03-10 DIAGNOSIS — I70221 Atherosclerosis of native arteries of extremities with rest pain, right leg: Secondary | ICD-10-CM | POA: Diagnosis not present

## 2021-03-10 DIAGNOSIS — T8789 Other complications of amputation stump: Secondary | ICD-10-CM | POA: Diagnosis not present

## 2021-03-11 DIAGNOSIS — E1122 Type 2 diabetes mellitus with diabetic chronic kidney disease: Secondary | ICD-10-CM | POA: Diagnosis not present

## 2021-03-11 DIAGNOSIS — I70221 Atherosclerosis of native arteries of extremities with rest pain, right leg: Secondary | ICD-10-CM | POA: Diagnosis not present

## 2021-03-11 DIAGNOSIS — N186 End stage renal disease: Secondary | ICD-10-CM | POA: Diagnosis not present

## 2021-03-11 DIAGNOSIS — I70261 Atherosclerosis of native arteries of extremities with gangrene, right leg: Secondary | ICD-10-CM | POA: Diagnosis not present

## 2021-03-11 DIAGNOSIS — T8789 Other complications of amputation stump: Secondary | ICD-10-CM | POA: Diagnosis not present

## 2021-03-11 DIAGNOSIS — I120 Hypertensive chronic kidney disease with stage 5 chronic kidney disease or end stage renal disease: Secondary | ICD-10-CM | POA: Diagnosis not present

## 2021-03-11 DIAGNOSIS — D631 Anemia in chronic kidney disease: Secondary | ICD-10-CM | POA: Diagnosis not present

## 2021-03-11 DIAGNOSIS — Z992 Dependence on renal dialysis: Secondary | ICD-10-CM | POA: Diagnosis not present

## 2021-03-11 DIAGNOSIS — Z794 Long term (current) use of insulin: Secondary | ICD-10-CM | POA: Diagnosis not present

## 2021-03-12 DIAGNOSIS — Z794 Long term (current) use of insulin: Secondary | ICD-10-CM | POA: Diagnosis not present

## 2021-03-12 DIAGNOSIS — E1122 Type 2 diabetes mellitus with diabetic chronic kidney disease: Secondary | ICD-10-CM | POA: Diagnosis not present

## 2021-03-12 DIAGNOSIS — I120 Hypertensive chronic kidney disease with stage 5 chronic kidney disease or end stage renal disease: Secondary | ICD-10-CM | POA: Diagnosis not present

## 2021-03-12 DIAGNOSIS — N186 End stage renal disease: Secondary | ICD-10-CM | POA: Diagnosis not present

## 2021-03-12 DIAGNOSIS — D631 Anemia in chronic kidney disease: Secondary | ICD-10-CM | POA: Diagnosis not present

## 2021-03-12 DIAGNOSIS — I70261 Atherosclerosis of native arteries of extremities with gangrene, right leg: Secondary | ICD-10-CM | POA: Diagnosis not present

## 2021-03-12 DIAGNOSIS — Z992 Dependence on renal dialysis: Secondary | ICD-10-CM | POA: Diagnosis not present

## 2021-03-13 DIAGNOSIS — E1122 Type 2 diabetes mellitus with diabetic chronic kidney disease: Secondary | ICD-10-CM | POA: Diagnosis not present

## 2021-03-13 DIAGNOSIS — N186 End stage renal disease: Secondary | ICD-10-CM | POA: Diagnosis not present

## 2021-03-13 DIAGNOSIS — Z794 Long term (current) use of insulin: Secondary | ICD-10-CM | POA: Diagnosis not present

## 2021-03-13 DIAGNOSIS — I120 Hypertensive chronic kidney disease with stage 5 chronic kidney disease or end stage renal disease: Secondary | ICD-10-CM | POA: Diagnosis not present

## 2021-03-13 DIAGNOSIS — D631 Anemia in chronic kidney disease: Secondary | ICD-10-CM | POA: Diagnosis not present

## 2021-03-13 DIAGNOSIS — Z992 Dependence on renal dialysis: Secondary | ICD-10-CM | POA: Diagnosis not present

## 2021-03-13 DIAGNOSIS — I70261 Atherosclerosis of native arteries of extremities with gangrene, right leg: Secondary | ICD-10-CM | POA: Diagnosis not present

## 2021-03-14 DIAGNOSIS — Z7984 Long term (current) use of oral hypoglycemic drugs: Secondary | ICD-10-CM | POA: Diagnosis not present

## 2021-03-14 DIAGNOSIS — E1122 Type 2 diabetes mellitus with diabetic chronic kidney disease: Secondary | ICD-10-CM | POA: Diagnosis not present

## 2021-03-14 DIAGNOSIS — I120 Hypertensive chronic kidney disease with stage 5 chronic kidney disease or end stage renal disease: Secondary | ICD-10-CM | POA: Diagnosis not present

## 2021-03-14 DIAGNOSIS — N186 End stage renal disease: Secondary | ICD-10-CM | POA: Diagnosis not present

## 2021-03-14 DIAGNOSIS — Z992 Dependence on renal dialysis: Secondary | ICD-10-CM | POA: Diagnosis not present

## 2021-03-15 DIAGNOSIS — Z9889 Other specified postprocedural states: Secondary | ICD-10-CM | POA: Diagnosis not present

## 2021-03-15 DIAGNOSIS — Z4889 Encounter for other specified surgical aftercare: Secondary | ICD-10-CM | POA: Diagnosis not present

## 2021-03-15 DIAGNOSIS — S91104D Unspecified open wound of right lesser toe(s) without damage to nail, subsequent encounter: Secondary | ICD-10-CM | POA: Diagnosis not present

## 2021-03-15 DIAGNOSIS — M6281 Muscle weakness (generalized): Secondary | ICD-10-CM | POA: Diagnosis not present

## 2021-03-15 DIAGNOSIS — R29898 Other symptoms and signs involving the musculoskeletal system: Secondary | ICD-10-CM | POA: Diagnosis not present

## 2021-03-15 DIAGNOSIS — N184 Chronic kidney disease, stage 4 (severe): Secondary | ICD-10-CM | POA: Diagnosis not present

## 2021-03-15 DIAGNOSIS — Z992 Dependence on renal dialysis: Secondary | ICD-10-CM | POA: Diagnosis not present

## 2021-03-15 DIAGNOSIS — Z794 Long term (current) use of insulin: Secondary | ICD-10-CM | POA: Diagnosis not present

## 2021-03-15 DIAGNOSIS — S81801D Unspecified open wound, right lower leg, subsequent encounter: Secondary | ICD-10-CM | POA: Diagnosis not present

## 2021-03-15 DIAGNOSIS — M869 Osteomyelitis, unspecified: Secondary | ICD-10-CM | POA: Diagnosis not present

## 2021-03-15 DIAGNOSIS — R197 Diarrhea, unspecified: Secondary | ICD-10-CM | POA: Diagnosis not present

## 2021-03-15 DIAGNOSIS — D631 Anemia in chronic kidney disease: Secondary | ICD-10-CM | POA: Diagnosis not present

## 2021-03-15 DIAGNOSIS — Z741 Need for assistance with personal care: Secondary | ICD-10-CM | POA: Diagnosis not present

## 2021-03-15 DIAGNOSIS — E119 Type 2 diabetes mellitus without complications: Secondary | ICD-10-CM | POA: Diagnosis not present

## 2021-03-15 DIAGNOSIS — E1165 Type 2 diabetes mellitus with hyperglycemia: Secondary | ICD-10-CM | POA: Diagnosis not present

## 2021-03-15 DIAGNOSIS — I7025 Atherosclerosis of native arteries of other extremities with ulceration: Secondary | ICD-10-CM | POA: Diagnosis not present

## 2021-03-15 DIAGNOSIS — I10 Essential (primary) hypertension: Secondary | ICD-10-CM | POA: Diagnosis not present

## 2021-03-15 DIAGNOSIS — R079 Chest pain, unspecified: Secondary | ICD-10-CM | POA: Diagnosis not present

## 2021-03-15 DIAGNOSIS — S91109D Unspecified open wound of unspecified toe(s) without damage to nail, subsequent encounter: Secondary | ICD-10-CM | POA: Diagnosis not present

## 2021-03-15 DIAGNOSIS — E1142 Type 2 diabetes mellitus with diabetic polyneuropathy: Secondary | ICD-10-CM | POA: Diagnosis not present

## 2021-03-15 DIAGNOSIS — M792 Neuralgia and neuritis, unspecified: Secondary | ICD-10-CM | POA: Diagnosis not present

## 2021-03-15 DIAGNOSIS — Z452 Encounter for adjustment and management of vascular access device: Secondary | ICD-10-CM | POA: Diagnosis not present

## 2021-03-15 DIAGNOSIS — M79669 Pain in unspecified lower leg: Secondary | ICD-10-CM | POA: Diagnosis not present

## 2021-03-15 DIAGNOSIS — E1122 Type 2 diabetes mellitus with diabetic chronic kidney disease: Secondary | ICD-10-CM | POA: Diagnosis not present

## 2021-03-15 DIAGNOSIS — I96 Gangrene, not elsewhere classified: Secondary | ICD-10-CM | POA: Diagnosis not present

## 2021-03-15 DIAGNOSIS — R2689 Other abnormalities of gait and mobility: Secondary | ICD-10-CM | POA: Diagnosis not present

## 2021-03-15 DIAGNOSIS — N2581 Secondary hyperparathyroidism of renal origin: Secondary | ICD-10-CM | POA: Diagnosis not present

## 2021-03-15 DIAGNOSIS — L89619 Pressure ulcer of right heel, unspecified stage: Secondary | ICD-10-CM | POA: Diagnosis not present

## 2021-03-15 DIAGNOSIS — D509 Iron deficiency anemia, unspecified: Secondary | ICD-10-CM | POA: Diagnosis not present

## 2021-03-15 DIAGNOSIS — N186 End stage renal disease: Secondary | ICD-10-CM | POA: Diagnosis not present

## 2021-03-15 DIAGNOSIS — E1322 Other specified diabetes mellitus with diabetic chronic kidney disease: Secondary | ICD-10-CM | POA: Diagnosis not present

## 2021-03-15 DIAGNOSIS — E11621 Type 2 diabetes mellitus with foot ulcer: Secondary | ICD-10-CM | POA: Diagnosis not present

## 2021-03-15 DIAGNOSIS — D689 Coagulation defect, unspecified: Secondary | ICD-10-CM | POA: Diagnosis not present

## 2021-03-15 DIAGNOSIS — E875 Hyperkalemia: Secondary | ICD-10-CM | POA: Diagnosis not present

## 2021-03-15 DIAGNOSIS — I739 Peripheral vascular disease, unspecified: Secondary | ICD-10-CM | POA: Diagnosis not present

## 2021-03-15 DIAGNOSIS — L97508 Non-pressure chronic ulcer of other part of unspecified foot with other specified severity: Secondary | ICD-10-CM | POA: Diagnosis not present

## 2021-03-15 DIAGNOSIS — R051 Acute cough: Secondary | ICD-10-CM | POA: Diagnosis not present

## 2021-03-15 DIAGNOSIS — S91301D Unspecified open wound, right foot, subsequent encounter: Secondary | ICD-10-CM | POA: Diagnosis not present

## 2021-03-15 DIAGNOSIS — Z8679 Personal history of other diseases of the circulatory system: Secondary | ICD-10-CM | POA: Diagnosis not present

## 2021-03-16 DIAGNOSIS — I739 Peripheral vascular disease, unspecified: Secondary | ICD-10-CM | POA: Diagnosis not present

## 2021-03-16 DIAGNOSIS — M869 Osteomyelitis, unspecified: Secondary | ICD-10-CM | POA: Diagnosis not present

## 2021-03-16 DIAGNOSIS — N186 End stage renal disease: Secondary | ICD-10-CM | POA: Diagnosis not present

## 2021-03-16 DIAGNOSIS — Z992 Dependence on renal dialysis: Secondary | ICD-10-CM | POA: Diagnosis not present

## 2021-03-16 DIAGNOSIS — E119 Type 2 diabetes mellitus without complications: Secondary | ICD-10-CM | POA: Diagnosis not present

## 2021-03-17 DIAGNOSIS — Z992 Dependence on renal dialysis: Secondary | ICD-10-CM | POA: Diagnosis not present

## 2021-03-17 DIAGNOSIS — D509 Iron deficiency anemia, unspecified: Secondary | ICD-10-CM | POA: Diagnosis not present

## 2021-03-17 DIAGNOSIS — D631 Anemia in chronic kidney disease: Secondary | ICD-10-CM | POA: Diagnosis not present

## 2021-03-17 DIAGNOSIS — N186 End stage renal disease: Secondary | ICD-10-CM | POA: Diagnosis not present

## 2021-03-17 DIAGNOSIS — N2581 Secondary hyperparathyroidism of renal origin: Secondary | ICD-10-CM | POA: Diagnosis not present

## 2021-03-17 DIAGNOSIS — E1122 Type 2 diabetes mellitus with diabetic chronic kidney disease: Secondary | ICD-10-CM | POA: Diagnosis not present

## 2021-03-17 DIAGNOSIS — D689 Coagulation defect, unspecified: Secondary | ICD-10-CM | POA: Diagnosis not present

## 2021-03-19 DIAGNOSIS — D631 Anemia in chronic kidney disease: Secondary | ICD-10-CM | POA: Diagnosis not present

## 2021-03-19 DIAGNOSIS — N186 End stage renal disease: Secondary | ICD-10-CM | POA: Diagnosis not present

## 2021-03-19 DIAGNOSIS — D509 Iron deficiency anemia, unspecified: Secondary | ICD-10-CM | POA: Diagnosis not present

## 2021-03-19 DIAGNOSIS — N2581 Secondary hyperparathyroidism of renal origin: Secondary | ICD-10-CM | POA: Diagnosis not present

## 2021-03-19 DIAGNOSIS — E1122 Type 2 diabetes mellitus with diabetic chronic kidney disease: Secondary | ICD-10-CM | POA: Diagnosis not present

## 2021-03-19 DIAGNOSIS — Z992 Dependence on renal dialysis: Secondary | ICD-10-CM | POA: Diagnosis not present

## 2021-03-19 DIAGNOSIS — D689 Coagulation defect, unspecified: Secondary | ICD-10-CM | POA: Diagnosis not present

## 2021-03-21 ENCOUNTER — Telehealth: Payer: Self-pay | Admitting: Family Medicine

## 2021-03-21 NOTE — Telephone Encounter (Signed)
Called patient to schedule AWV, call couldn't be completed, if patient calls back please assist in scheduling. Thanks! ?

## 2021-03-22 DIAGNOSIS — D509 Iron deficiency anemia, unspecified: Secondary | ICD-10-CM | POA: Diagnosis not present

## 2021-03-22 DIAGNOSIS — N2581 Secondary hyperparathyroidism of renal origin: Secondary | ICD-10-CM | POA: Diagnosis not present

## 2021-03-22 DIAGNOSIS — S91109D Unspecified open wound of unspecified toe(s) without damage to nail, subsequent encounter: Secondary | ICD-10-CM | POA: Diagnosis not present

## 2021-03-22 DIAGNOSIS — S91301D Unspecified open wound, right foot, subsequent encounter: Secondary | ICD-10-CM | POA: Diagnosis not present

## 2021-03-22 DIAGNOSIS — L89619 Pressure ulcer of right heel, unspecified stage: Secondary | ICD-10-CM | POA: Diagnosis not present

## 2021-03-22 DIAGNOSIS — E119 Type 2 diabetes mellitus without complications: Secondary | ICD-10-CM | POA: Diagnosis not present

## 2021-03-22 DIAGNOSIS — Z992 Dependence on renal dialysis: Secondary | ICD-10-CM | POA: Diagnosis not present

## 2021-03-22 DIAGNOSIS — M6281 Muscle weakness (generalized): Secondary | ICD-10-CM | POA: Diagnosis not present

## 2021-03-22 DIAGNOSIS — S81801D Unspecified open wound, right lower leg, subsequent encounter: Secondary | ICD-10-CM | POA: Diagnosis not present

## 2021-03-22 DIAGNOSIS — Z4889 Encounter for other specified surgical aftercare: Secondary | ICD-10-CM | POA: Diagnosis not present

## 2021-03-22 DIAGNOSIS — N186 End stage renal disease: Secondary | ICD-10-CM | POA: Diagnosis not present

## 2021-03-23 DIAGNOSIS — I96 Gangrene, not elsewhere classified: Secondary | ICD-10-CM | POA: Diagnosis not present

## 2021-03-23 DIAGNOSIS — M869 Osteomyelitis, unspecified: Secondary | ICD-10-CM | POA: Diagnosis not present

## 2021-03-23 DIAGNOSIS — M6281 Muscle weakness (generalized): Secondary | ICD-10-CM | POA: Diagnosis not present

## 2021-03-23 DIAGNOSIS — M792 Neuralgia and neuritis, unspecified: Secondary | ICD-10-CM | POA: Diagnosis not present

## 2021-03-23 DIAGNOSIS — Z4889 Encounter for other specified surgical aftercare: Secondary | ICD-10-CM | POA: Diagnosis not present

## 2021-03-23 DIAGNOSIS — S91109D Unspecified open wound of unspecified toe(s) without damage to nail, subsequent encounter: Secondary | ICD-10-CM | POA: Diagnosis not present

## 2021-03-23 DIAGNOSIS — L89619 Pressure ulcer of right heel, unspecified stage: Secondary | ICD-10-CM | POA: Diagnosis not present

## 2021-03-23 DIAGNOSIS — S91301D Unspecified open wound, right foot, subsequent encounter: Secondary | ICD-10-CM | POA: Diagnosis not present

## 2021-03-23 DIAGNOSIS — E119 Type 2 diabetes mellitus without complications: Secondary | ICD-10-CM | POA: Diagnosis not present

## 2021-03-23 DIAGNOSIS — S81801D Unspecified open wound, right lower leg, subsequent encounter: Secondary | ICD-10-CM | POA: Diagnosis not present

## 2021-03-24 DIAGNOSIS — N186 End stage renal disease: Secondary | ICD-10-CM | POA: Diagnosis not present

## 2021-03-24 DIAGNOSIS — D509 Iron deficiency anemia, unspecified: Secondary | ICD-10-CM | POA: Diagnosis not present

## 2021-03-24 DIAGNOSIS — N2581 Secondary hyperparathyroidism of renal origin: Secondary | ICD-10-CM | POA: Diagnosis not present

## 2021-03-24 DIAGNOSIS — Z992 Dependence on renal dialysis: Secondary | ICD-10-CM | POA: Diagnosis not present

## 2021-03-25 DIAGNOSIS — M79669 Pain in unspecified lower leg: Secondary | ICD-10-CM | POA: Diagnosis not present

## 2021-03-25 DIAGNOSIS — Z9889 Other specified postprocedural states: Secondary | ICD-10-CM | POA: Diagnosis not present

## 2021-03-25 DIAGNOSIS — I7025 Atherosclerosis of native arteries of other extremities with ulceration: Secondary | ICD-10-CM | POA: Diagnosis not present

## 2021-03-25 DIAGNOSIS — R197 Diarrhea, unspecified: Secondary | ICD-10-CM | POA: Diagnosis not present

## 2021-03-26 DIAGNOSIS — Z992 Dependence on renal dialysis: Secondary | ICD-10-CM | POA: Diagnosis not present

## 2021-03-26 DIAGNOSIS — N2581 Secondary hyperparathyroidism of renal origin: Secondary | ICD-10-CM | POA: Diagnosis not present

## 2021-03-26 DIAGNOSIS — N186 End stage renal disease: Secondary | ICD-10-CM | POA: Diagnosis not present

## 2021-03-26 DIAGNOSIS — D509 Iron deficiency anemia, unspecified: Secondary | ICD-10-CM | POA: Diagnosis not present

## 2021-03-28 DIAGNOSIS — L97508 Non-pressure chronic ulcer of other part of unspecified foot with other specified severity: Secondary | ICD-10-CM | POA: Diagnosis not present

## 2021-03-28 DIAGNOSIS — E119 Type 2 diabetes mellitus without complications: Secondary | ICD-10-CM | POA: Diagnosis not present

## 2021-03-28 DIAGNOSIS — N184 Chronic kidney disease, stage 4 (severe): Secondary | ICD-10-CM | POA: Diagnosis not present

## 2021-03-28 DIAGNOSIS — E11621 Type 2 diabetes mellitus with foot ulcer: Secondary | ICD-10-CM | POA: Diagnosis not present

## 2021-03-29 DIAGNOSIS — N2581 Secondary hyperparathyroidism of renal origin: Secondary | ICD-10-CM | POA: Diagnosis not present

## 2021-03-29 DIAGNOSIS — D509 Iron deficiency anemia, unspecified: Secondary | ICD-10-CM | POA: Diagnosis not present

## 2021-03-29 DIAGNOSIS — Z992 Dependence on renal dialysis: Secondary | ICD-10-CM | POA: Diagnosis not present

## 2021-03-29 DIAGNOSIS — N186 End stage renal disease: Secondary | ICD-10-CM | POA: Diagnosis not present

## 2021-03-31 DIAGNOSIS — R2689 Other abnormalities of gait and mobility: Secondary | ICD-10-CM | POA: Diagnosis not present

## 2021-03-31 DIAGNOSIS — N2581 Secondary hyperparathyroidism of renal origin: Secondary | ICD-10-CM | POA: Diagnosis not present

## 2021-03-31 DIAGNOSIS — D509 Iron deficiency anemia, unspecified: Secondary | ICD-10-CM | POA: Diagnosis not present

## 2021-03-31 DIAGNOSIS — N186 End stage renal disease: Secondary | ICD-10-CM | POA: Diagnosis not present

## 2021-03-31 DIAGNOSIS — E119 Type 2 diabetes mellitus without complications: Secondary | ICD-10-CM | POA: Diagnosis not present

## 2021-03-31 DIAGNOSIS — R197 Diarrhea, unspecified: Secondary | ICD-10-CM | POA: Diagnosis not present

## 2021-03-31 DIAGNOSIS — Z992 Dependence on renal dialysis: Secondary | ICD-10-CM | POA: Diagnosis not present

## 2021-04-02 DIAGNOSIS — S91104D Unspecified open wound of right lesser toe(s) without damage to nail, subsequent encounter: Secondary | ICD-10-CM | POA: Diagnosis not present

## 2021-04-02 DIAGNOSIS — Z452 Encounter for adjustment and management of vascular access device: Secondary | ICD-10-CM | POA: Diagnosis not present

## 2021-04-02 DIAGNOSIS — D509 Iron deficiency anemia, unspecified: Secondary | ICD-10-CM | POA: Diagnosis not present

## 2021-04-02 DIAGNOSIS — N186 End stage renal disease: Secondary | ICD-10-CM | POA: Diagnosis not present

## 2021-04-02 DIAGNOSIS — E119 Type 2 diabetes mellitus without complications: Secondary | ICD-10-CM | POA: Diagnosis not present

## 2021-04-02 DIAGNOSIS — D631 Anemia in chronic kidney disease: Secondary | ICD-10-CM | POA: Diagnosis not present

## 2021-04-02 DIAGNOSIS — L89619 Pressure ulcer of right heel, unspecified stage: Secondary | ICD-10-CM | POA: Diagnosis not present

## 2021-04-02 DIAGNOSIS — M6281 Muscle weakness (generalized): Secondary | ICD-10-CM | POA: Diagnosis not present

## 2021-04-02 DIAGNOSIS — N2581 Secondary hyperparathyroidism of renal origin: Secondary | ICD-10-CM | POA: Diagnosis not present

## 2021-04-02 DIAGNOSIS — Z4889 Encounter for other specified surgical aftercare: Secondary | ICD-10-CM | POA: Diagnosis not present

## 2021-04-02 DIAGNOSIS — Z992 Dependence on renal dialysis: Secondary | ICD-10-CM | POA: Diagnosis not present

## 2021-04-02 DIAGNOSIS — S81801D Unspecified open wound, right lower leg, subsequent encounter: Secondary | ICD-10-CM | POA: Diagnosis not present

## 2021-04-02 DIAGNOSIS — S91301D Unspecified open wound, right foot, subsequent encounter: Secondary | ICD-10-CM | POA: Diagnosis not present

## 2021-04-05 DIAGNOSIS — N2581 Secondary hyperparathyroidism of renal origin: Secondary | ICD-10-CM | POA: Diagnosis not present

## 2021-04-05 DIAGNOSIS — S91301D Unspecified open wound, right foot, subsequent encounter: Secondary | ICD-10-CM | POA: Diagnosis not present

## 2021-04-05 DIAGNOSIS — S91104D Unspecified open wound of right lesser toe(s) without damage to nail, subsequent encounter: Secondary | ICD-10-CM | POA: Diagnosis not present

## 2021-04-05 DIAGNOSIS — L89619 Pressure ulcer of right heel, unspecified stage: Secondary | ICD-10-CM | POA: Diagnosis not present

## 2021-04-05 DIAGNOSIS — M6281 Muscle weakness (generalized): Secondary | ICD-10-CM | POA: Diagnosis not present

## 2021-04-05 DIAGNOSIS — S81801D Unspecified open wound, right lower leg, subsequent encounter: Secondary | ICD-10-CM | POA: Diagnosis not present

## 2021-04-05 DIAGNOSIS — Z4889 Encounter for other specified surgical aftercare: Secondary | ICD-10-CM | POA: Diagnosis not present

## 2021-04-05 DIAGNOSIS — Z992 Dependence on renal dialysis: Secondary | ICD-10-CM | POA: Diagnosis not present

## 2021-04-05 DIAGNOSIS — D509 Iron deficiency anemia, unspecified: Secondary | ICD-10-CM | POA: Diagnosis not present

## 2021-04-05 DIAGNOSIS — E119 Type 2 diabetes mellitus without complications: Secondary | ICD-10-CM | POA: Diagnosis not present

## 2021-04-05 DIAGNOSIS — N186 End stage renal disease: Secondary | ICD-10-CM | POA: Diagnosis not present

## 2021-04-07 DIAGNOSIS — Z992 Dependence on renal dialysis: Secondary | ICD-10-CM | POA: Diagnosis not present

## 2021-04-07 DIAGNOSIS — N186 End stage renal disease: Secondary | ICD-10-CM | POA: Diagnosis not present

## 2021-04-07 DIAGNOSIS — N2581 Secondary hyperparathyroidism of renal origin: Secondary | ICD-10-CM | POA: Diagnosis not present

## 2021-04-07 DIAGNOSIS — D509 Iron deficiency anemia, unspecified: Secondary | ICD-10-CM | POA: Diagnosis not present

## 2021-04-08 DIAGNOSIS — I7025 Atherosclerosis of native arteries of other extremities with ulceration: Secondary | ICD-10-CM | POA: Diagnosis not present

## 2021-04-08 DIAGNOSIS — M79669 Pain in unspecified lower leg: Secondary | ICD-10-CM | POA: Diagnosis not present

## 2021-04-09 DIAGNOSIS — N2581 Secondary hyperparathyroidism of renal origin: Secondary | ICD-10-CM | POA: Diagnosis not present

## 2021-04-09 DIAGNOSIS — N186 End stage renal disease: Secondary | ICD-10-CM | POA: Diagnosis not present

## 2021-04-09 DIAGNOSIS — D509 Iron deficiency anemia, unspecified: Secondary | ICD-10-CM | POA: Diagnosis not present

## 2021-04-09 DIAGNOSIS — Z992 Dependence on renal dialysis: Secondary | ICD-10-CM | POA: Diagnosis not present

## 2021-04-12 DIAGNOSIS — L89619 Pressure ulcer of right heel, unspecified stage: Secondary | ICD-10-CM | POA: Diagnosis not present

## 2021-04-12 DIAGNOSIS — N186 End stage renal disease: Secondary | ICD-10-CM | POA: Diagnosis not present

## 2021-04-12 DIAGNOSIS — S91104D Unspecified open wound of right lesser toe(s) without damage to nail, subsequent encounter: Secondary | ICD-10-CM | POA: Diagnosis not present

## 2021-04-12 DIAGNOSIS — S81801D Unspecified open wound, right lower leg, subsequent encounter: Secondary | ICD-10-CM | POA: Diagnosis not present

## 2021-04-12 DIAGNOSIS — D509 Iron deficiency anemia, unspecified: Secondary | ICD-10-CM | POA: Diagnosis not present

## 2021-04-12 DIAGNOSIS — M6281 Muscle weakness (generalized): Secondary | ICD-10-CM | POA: Diagnosis not present

## 2021-04-12 DIAGNOSIS — D631 Anemia in chronic kidney disease: Secondary | ICD-10-CM | POA: Diagnosis not present

## 2021-04-12 DIAGNOSIS — Z4889 Encounter for other specified surgical aftercare: Secondary | ICD-10-CM | POA: Diagnosis not present

## 2021-04-12 DIAGNOSIS — Z992 Dependence on renal dialysis: Secondary | ICD-10-CM | POA: Diagnosis not present

## 2021-04-12 DIAGNOSIS — N2581 Secondary hyperparathyroidism of renal origin: Secondary | ICD-10-CM | POA: Diagnosis not present

## 2021-04-12 DIAGNOSIS — E119 Type 2 diabetes mellitus without complications: Secondary | ICD-10-CM | POA: Diagnosis not present

## 2021-04-13 DIAGNOSIS — S91109D Unspecified open wound of unspecified toe(s) without damage to nail, subsequent encounter: Secondary | ICD-10-CM | POA: Diagnosis not present

## 2021-04-13 DIAGNOSIS — M869 Osteomyelitis, unspecified: Secondary | ICD-10-CM | POA: Diagnosis not present

## 2021-04-13 DIAGNOSIS — Z4889 Encounter for other specified surgical aftercare: Secondary | ICD-10-CM | POA: Diagnosis not present

## 2021-04-13 DIAGNOSIS — S99821D Other specified injuries of right foot, subsequent encounter: Secondary | ICD-10-CM | POA: Diagnosis not present

## 2021-04-13 DIAGNOSIS — I96 Gangrene, not elsewhere classified: Secondary | ICD-10-CM | POA: Diagnosis not present

## 2021-04-13 DIAGNOSIS — Z4901 Encounter for fitting and adjustment of extracorporeal dialysis catheter: Secondary | ICD-10-CM | POA: Diagnosis not present

## 2021-04-13 DIAGNOSIS — S81801D Unspecified open wound, right lower leg, subsequent encounter: Secondary | ICD-10-CM | POA: Diagnosis not present

## 2021-04-13 DIAGNOSIS — M792 Neuralgia and neuritis, unspecified: Secondary | ICD-10-CM | POA: Diagnosis not present

## 2021-04-13 DIAGNOSIS — M6281 Muscle weakness (generalized): Secondary | ICD-10-CM | POA: Diagnosis not present

## 2021-04-13 DIAGNOSIS — E119 Type 2 diabetes mellitus without complications: Secondary | ICD-10-CM | POA: Diagnosis not present

## 2021-04-13 DIAGNOSIS — L89619 Pressure ulcer of right heel, unspecified stage: Secondary | ICD-10-CM | POA: Diagnosis not present

## 2021-04-13 DIAGNOSIS — Z452 Encounter for adjustment and management of vascular access device: Secondary | ICD-10-CM | POA: Diagnosis not present

## 2021-04-13 DIAGNOSIS — S91301D Unspecified open wound, right foot, subsequent encounter: Secondary | ICD-10-CM | POA: Diagnosis not present

## 2021-04-14 DIAGNOSIS — N186 End stage renal disease: Secondary | ICD-10-CM | POA: Diagnosis not present

## 2021-04-14 DIAGNOSIS — N2581 Secondary hyperparathyroidism of renal origin: Secondary | ICD-10-CM | POA: Diagnosis not present

## 2021-04-14 DIAGNOSIS — D631 Anemia in chronic kidney disease: Secondary | ICD-10-CM | POA: Diagnosis not present

## 2021-04-14 DIAGNOSIS — D509 Iron deficiency anemia, unspecified: Secondary | ICD-10-CM | POA: Diagnosis not present

## 2021-04-14 DIAGNOSIS — Z992 Dependence on renal dialysis: Secondary | ICD-10-CM | POA: Diagnosis not present

## 2021-04-15 DIAGNOSIS — E11621 Type 2 diabetes mellitus with foot ulcer: Secondary | ICD-10-CM | POA: Diagnosis not present

## 2021-04-15 DIAGNOSIS — L97508 Non-pressure chronic ulcer of other part of unspecified foot with other specified severity: Secondary | ICD-10-CM | POA: Diagnosis not present

## 2021-04-15 DIAGNOSIS — Z89421 Acquired absence of other right toe(s): Secondary | ICD-10-CM | POA: Diagnosis not present

## 2021-04-15 DIAGNOSIS — E119 Type 2 diabetes mellitus without complications: Secondary | ICD-10-CM | POA: Diagnosis not present

## 2021-04-15 DIAGNOSIS — M869 Osteomyelitis, unspecified: Secondary | ICD-10-CM | POA: Diagnosis not present

## 2021-04-15 DIAGNOSIS — N184 Chronic kidney disease, stage 4 (severe): Secondary | ICD-10-CM | POA: Diagnosis not present

## 2021-04-15 DIAGNOSIS — M7989 Other specified soft tissue disorders: Secondary | ICD-10-CM | POA: Diagnosis not present

## 2021-04-16 DIAGNOSIS — N186 End stage renal disease: Secondary | ICD-10-CM | POA: Diagnosis not present

## 2021-04-16 DIAGNOSIS — D631 Anemia in chronic kidney disease: Secondary | ICD-10-CM | POA: Diagnosis not present

## 2021-04-16 DIAGNOSIS — Z992 Dependence on renal dialysis: Secondary | ICD-10-CM | POA: Diagnosis not present

## 2021-04-16 DIAGNOSIS — D509 Iron deficiency anemia, unspecified: Secondary | ICD-10-CM | POA: Diagnosis not present

## 2021-04-16 DIAGNOSIS — N2581 Secondary hyperparathyroidism of renal origin: Secondary | ICD-10-CM | POA: Diagnosis not present

## 2021-04-17 DIAGNOSIS — M869 Osteomyelitis, unspecified: Secondary | ICD-10-CM | POA: Diagnosis not present

## 2021-04-19 DIAGNOSIS — N186 End stage renal disease: Secondary | ICD-10-CM | POA: Diagnosis not present

## 2021-04-19 DIAGNOSIS — S91104D Unspecified open wound of right lesser toe(s) without damage to nail, subsequent encounter: Secondary | ICD-10-CM | POA: Diagnosis not present

## 2021-04-19 DIAGNOSIS — N2581 Secondary hyperparathyroidism of renal origin: Secondary | ICD-10-CM | POA: Diagnosis not present

## 2021-04-19 DIAGNOSIS — M869 Osteomyelitis, unspecified: Secondary | ICD-10-CM | POA: Diagnosis not present

## 2021-04-19 DIAGNOSIS — Z992 Dependence on renal dialysis: Secondary | ICD-10-CM | POA: Diagnosis not present

## 2021-04-19 DIAGNOSIS — L89619 Pressure ulcer of right heel, unspecified stage: Secondary | ICD-10-CM | POA: Diagnosis not present

## 2021-04-19 DIAGNOSIS — D509 Iron deficiency anemia, unspecified: Secondary | ICD-10-CM | POA: Diagnosis not present

## 2021-04-19 DIAGNOSIS — E119 Type 2 diabetes mellitus without complications: Secondary | ICD-10-CM | POA: Diagnosis not present

## 2021-04-19 DIAGNOSIS — M6281 Muscle weakness (generalized): Secondary | ICD-10-CM | POA: Diagnosis not present

## 2021-04-19 DIAGNOSIS — S81801D Unspecified open wound, right lower leg, subsequent encounter: Secondary | ICD-10-CM | POA: Diagnosis not present

## 2021-04-19 DIAGNOSIS — E876 Hypokalemia: Secondary | ICD-10-CM | POA: Diagnosis not present

## 2021-04-20 DIAGNOSIS — M869 Osteomyelitis, unspecified: Secondary | ICD-10-CM | POA: Diagnosis not present

## 2021-04-20 DIAGNOSIS — E1165 Type 2 diabetes mellitus with hyperglycemia: Secondary | ICD-10-CM | POA: Diagnosis not present

## 2021-04-20 DIAGNOSIS — Z452 Encounter for adjustment and management of vascular access device: Secondary | ICD-10-CM | POA: Diagnosis not present

## 2021-04-21 DIAGNOSIS — D509 Iron deficiency anemia, unspecified: Secondary | ICD-10-CM | POA: Diagnosis not present

## 2021-04-21 DIAGNOSIS — E876 Hypokalemia: Secondary | ICD-10-CM | POA: Diagnosis not present

## 2021-04-21 DIAGNOSIS — Z992 Dependence on renal dialysis: Secondary | ICD-10-CM | POA: Diagnosis not present

## 2021-04-21 DIAGNOSIS — N2581 Secondary hyperparathyroidism of renal origin: Secondary | ICD-10-CM | POA: Diagnosis not present

## 2021-04-21 DIAGNOSIS — N186 End stage renal disease: Secondary | ICD-10-CM | POA: Diagnosis not present

## 2021-04-22 DIAGNOSIS — Z9889 Other specified postprocedural states: Secondary | ICD-10-CM | POA: Diagnosis not present

## 2021-04-22 DIAGNOSIS — M869 Osteomyelitis, unspecified: Secondary | ICD-10-CM | POA: Diagnosis not present

## 2021-04-22 DIAGNOSIS — I7025 Atherosclerosis of native arteries of other extremities with ulceration: Secondary | ICD-10-CM | POA: Diagnosis not present

## 2021-04-23 DIAGNOSIS — N2581 Secondary hyperparathyroidism of renal origin: Secondary | ICD-10-CM | POA: Diagnosis not present

## 2021-04-23 DIAGNOSIS — Z992 Dependence on renal dialysis: Secondary | ICD-10-CM | POA: Diagnosis not present

## 2021-04-23 DIAGNOSIS — E876 Hypokalemia: Secondary | ICD-10-CM | POA: Diagnosis not present

## 2021-04-23 DIAGNOSIS — D509 Iron deficiency anemia, unspecified: Secondary | ICD-10-CM | POA: Diagnosis not present

## 2021-04-23 DIAGNOSIS — N186 End stage renal disease: Secondary | ICD-10-CM | POA: Diagnosis not present

## 2021-04-25 DIAGNOSIS — M79672 Pain in left foot: Secondary | ICD-10-CM | POA: Diagnosis not present

## 2021-04-25 DIAGNOSIS — E119 Type 2 diabetes mellitus without complications: Secondary | ICD-10-CM | POA: Diagnosis not present

## 2021-04-25 DIAGNOSIS — M79671 Pain in right foot: Secondary | ICD-10-CM | POA: Diagnosis not present

## 2021-04-25 DIAGNOSIS — L603 Nail dystrophy: Secondary | ICD-10-CM | POA: Diagnosis not present

## 2021-04-25 DIAGNOSIS — I739 Peripheral vascular disease, unspecified: Secondary | ICD-10-CM | POA: Diagnosis not present

## 2021-04-25 DIAGNOSIS — B351 Tinea unguium: Secondary | ICD-10-CM | POA: Diagnosis not present

## 2021-04-25 DIAGNOSIS — R609 Edema, unspecified: Secondary | ICD-10-CM | POA: Diagnosis not present

## 2021-04-26 DIAGNOSIS — Z992 Dependence on renal dialysis: Secondary | ICD-10-CM | POA: Diagnosis not present

## 2021-04-26 DIAGNOSIS — G629 Polyneuropathy, unspecified: Secondary | ICD-10-CM | POA: Diagnosis not present

## 2021-04-26 DIAGNOSIS — N2581 Secondary hyperparathyroidism of renal origin: Secondary | ICD-10-CM | POA: Diagnosis not present

## 2021-04-26 DIAGNOSIS — R739 Hyperglycemia, unspecified: Secondary | ICD-10-CM | POA: Diagnosis not present

## 2021-04-26 DIAGNOSIS — N186 End stage renal disease: Secondary | ICD-10-CM | POA: Diagnosis not present

## 2021-04-26 DIAGNOSIS — L299 Pruritus, unspecified: Secondary | ICD-10-CM | POA: Diagnosis not present

## 2021-04-26 DIAGNOSIS — D509 Iron deficiency anemia, unspecified: Secondary | ICD-10-CM | POA: Diagnosis not present

## 2021-04-26 DIAGNOSIS — I10 Essential (primary) hypertension: Secondary | ICD-10-CM | POA: Diagnosis not present

## 2021-04-27 DIAGNOSIS — S81801D Unspecified open wound, right lower leg, subsequent encounter: Secondary | ICD-10-CM | POA: Diagnosis not present

## 2021-04-27 DIAGNOSIS — E119 Type 2 diabetes mellitus without complications: Secondary | ICD-10-CM | POA: Diagnosis not present

## 2021-04-27 DIAGNOSIS — S91104D Unspecified open wound of right lesser toe(s) without damage to nail, subsequent encounter: Secondary | ICD-10-CM | POA: Diagnosis not present

## 2021-04-27 DIAGNOSIS — M6281 Muscle weakness (generalized): Secondary | ICD-10-CM | POA: Diagnosis not present

## 2021-04-27 DIAGNOSIS — L89619 Pressure ulcer of right heel, unspecified stage: Secondary | ICD-10-CM | POA: Diagnosis not present

## 2021-04-27 DIAGNOSIS — M869 Osteomyelitis, unspecified: Secondary | ICD-10-CM | POA: Diagnosis not present

## 2021-04-28 DIAGNOSIS — E119 Type 2 diabetes mellitus without complications: Secondary | ICD-10-CM | POA: Diagnosis not present

## 2021-04-28 DIAGNOSIS — N184 Chronic kidney disease, stage 4 (severe): Secondary | ICD-10-CM | POA: Diagnosis not present

## 2021-04-28 DIAGNOSIS — N186 End stage renal disease: Secondary | ICD-10-CM | POA: Diagnosis not present

## 2021-04-28 DIAGNOSIS — N2581 Secondary hyperparathyroidism of renal origin: Secondary | ICD-10-CM | POA: Diagnosis not present

## 2021-04-28 DIAGNOSIS — D509 Iron deficiency anemia, unspecified: Secondary | ICD-10-CM | POA: Diagnosis not present

## 2021-04-28 DIAGNOSIS — L299 Pruritus, unspecified: Secondary | ICD-10-CM | POA: Diagnosis not present

## 2021-04-28 DIAGNOSIS — L97508 Non-pressure chronic ulcer of other part of unspecified foot with other specified severity: Secondary | ICD-10-CM | POA: Diagnosis not present

## 2021-04-28 DIAGNOSIS — E11621 Type 2 diabetes mellitus with foot ulcer: Secondary | ICD-10-CM | POA: Diagnosis not present

## 2021-04-28 DIAGNOSIS — Z992 Dependence on renal dialysis: Secondary | ICD-10-CM | POA: Diagnosis not present

## 2021-04-30 DIAGNOSIS — N2581 Secondary hyperparathyroidism of renal origin: Secondary | ICD-10-CM | POA: Diagnosis not present

## 2021-04-30 DIAGNOSIS — D509 Iron deficiency anemia, unspecified: Secondary | ICD-10-CM | POA: Diagnosis not present

## 2021-04-30 DIAGNOSIS — L299 Pruritus, unspecified: Secondary | ICD-10-CM | POA: Diagnosis not present

## 2021-04-30 DIAGNOSIS — Z992 Dependence on renal dialysis: Secondary | ICD-10-CM | POA: Diagnosis not present

## 2021-04-30 DIAGNOSIS — N186 End stage renal disease: Secondary | ICD-10-CM | POA: Diagnosis not present

## 2021-05-02 DIAGNOSIS — G629 Polyneuropathy, unspecified: Secondary | ICD-10-CM | POA: Diagnosis not present

## 2021-05-02 DIAGNOSIS — R739 Hyperglycemia, unspecified: Secondary | ICD-10-CM | POA: Diagnosis not present

## 2021-05-02 DIAGNOSIS — N186 End stage renal disease: Secondary | ICD-10-CM | POA: Diagnosis not present

## 2021-05-02 DIAGNOSIS — I10 Essential (primary) hypertension: Secondary | ICD-10-CM | POA: Diagnosis not present

## 2021-05-03 DIAGNOSIS — Z992 Dependence on renal dialysis: Secondary | ICD-10-CM | POA: Diagnosis not present

## 2021-05-03 DIAGNOSIS — M6281 Muscle weakness (generalized): Secondary | ICD-10-CM | POA: Diagnosis not present

## 2021-05-03 DIAGNOSIS — N186 End stage renal disease: Secondary | ICD-10-CM | POA: Diagnosis not present

## 2021-05-03 DIAGNOSIS — L89619 Pressure ulcer of right heel, unspecified stage: Secondary | ICD-10-CM | POA: Diagnosis not present

## 2021-05-03 DIAGNOSIS — L299 Pruritus, unspecified: Secondary | ICD-10-CM | POA: Diagnosis not present

## 2021-05-03 DIAGNOSIS — S91104D Unspecified open wound of right lesser toe(s) without damage to nail, subsequent encounter: Secondary | ICD-10-CM | POA: Diagnosis not present

## 2021-05-03 DIAGNOSIS — E119 Type 2 diabetes mellitus without complications: Secondary | ICD-10-CM | POA: Diagnosis not present

## 2021-05-03 DIAGNOSIS — S81801D Unspecified open wound, right lower leg, subsequent encounter: Secondary | ICD-10-CM | POA: Diagnosis not present

## 2021-05-03 DIAGNOSIS — N2581 Secondary hyperparathyroidism of renal origin: Secondary | ICD-10-CM | POA: Diagnosis not present

## 2021-05-03 DIAGNOSIS — D509 Iron deficiency anemia, unspecified: Secondary | ICD-10-CM | POA: Diagnosis not present

## 2021-05-05 DIAGNOSIS — N186 End stage renal disease: Secondary | ICD-10-CM | POA: Diagnosis not present

## 2021-05-05 DIAGNOSIS — L299 Pruritus, unspecified: Secondary | ICD-10-CM | POA: Diagnosis not present

## 2021-05-05 DIAGNOSIS — D509 Iron deficiency anemia, unspecified: Secondary | ICD-10-CM | POA: Diagnosis not present

## 2021-05-05 DIAGNOSIS — Z992 Dependence on renal dialysis: Secondary | ICD-10-CM | POA: Diagnosis not present

## 2021-05-05 DIAGNOSIS — N2581 Secondary hyperparathyroidism of renal origin: Secondary | ICD-10-CM | POA: Diagnosis not present

## 2021-05-07 DIAGNOSIS — Z992 Dependence on renal dialysis: Secondary | ICD-10-CM | POA: Diagnosis not present

## 2021-05-07 DIAGNOSIS — L299 Pruritus, unspecified: Secondary | ICD-10-CM | POA: Diagnosis not present

## 2021-05-07 DIAGNOSIS — N2581 Secondary hyperparathyroidism of renal origin: Secondary | ICD-10-CM | POA: Diagnosis not present

## 2021-05-07 DIAGNOSIS — N186 End stage renal disease: Secondary | ICD-10-CM | POA: Diagnosis not present

## 2021-05-07 DIAGNOSIS — D509 Iron deficiency anemia, unspecified: Secondary | ICD-10-CM | POA: Diagnosis not present

## 2021-05-10 DIAGNOSIS — L299 Pruritus, unspecified: Secondary | ICD-10-CM | POA: Diagnosis not present

## 2021-05-10 DIAGNOSIS — N186 End stage renal disease: Secondary | ICD-10-CM | POA: Diagnosis not present

## 2021-05-10 DIAGNOSIS — Z992 Dependence on renal dialysis: Secondary | ICD-10-CM | POA: Diagnosis not present

## 2021-05-10 DIAGNOSIS — N2581 Secondary hyperparathyroidism of renal origin: Secondary | ICD-10-CM | POA: Diagnosis not present

## 2021-05-11 DIAGNOSIS — M6281 Muscle weakness (generalized): Secondary | ICD-10-CM | POA: Diagnosis not present

## 2021-05-11 DIAGNOSIS — E119 Type 2 diabetes mellitus without complications: Secondary | ICD-10-CM | POA: Diagnosis not present

## 2021-05-11 DIAGNOSIS — S91104D Unspecified open wound of right lesser toe(s) without damage to nail, subsequent encounter: Secondary | ICD-10-CM | POA: Diagnosis not present

## 2021-05-12 DIAGNOSIS — I5032 Chronic diastolic (congestive) heart failure: Secondary | ICD-10-CM | POA: Diagnosis not present

## 2021-05-12 DIAGNOSIS — E785 Hyperlipidemia, unspecified: Secondary | ICD-10-CM | POA: Diagnosis not present

## 2021-05-12 DIAGNOSIS — R109 Unspecified abdominal pain: Secondary | ICD-10-CM | POA: Diagnosis not present

## 2021-05-12 DIAGNOSIS — S91309A Unspecified open wound, unspecified foot, initial encounter: Secondary | ICD-10-CM | POA: Diagnosis not present

## 2021-05-12 DIAGNOSIS — L97411 Non-pressure chronic ulcer of right heel and midfoot limited to breakdown of skin: Secondary | ICD-10-CM | POA: Diagnosis not present

## 2021-05-12 DIAGNOSIS — I110 Hypertensive heart disease with heart failure: Secondary | ICD-10-CM | POA: Diagnosis not present

## 2021-05-12 DIAGNOSIS — R825 Elevated urine levels of drugs, medicaments and biological substances: Secondary | ICD-10-CM | POA: Diagnosis not present

## 2021-05-12 DIAGNOSIS — I2510 Atherosclerotic heart disease of native coronary artery without angina pectoris: Secondary | ICD-10-CM | POA: Diagnosis not present

## 2021-05-12 DIAGNOSIS — M869 Osteomyelitis, unspecified: Secondary | ICD-10-CM | POA: Diagnosis not present

## 2021-05-12 DIAGNOSIS — M79671 Pain in right foot: Secondary | ICD-10-CM | POA: Diagnosis not present

## 2021-05-12 DIAGNOSIS — M6281 Muscle weakness (generalized): Secondary | ICD-10-CM | POA: Diagnosis not present

## 2021-05-12 DIAGNOSIS — R234 Changes in skin texture: Secondary | ICD-10-CM | POA: Diagnosis not present

## 2021-05-12 DIAGNOSIS — Z89431 Acquired absence of right foot: Secondary | ICD-10-CM | POA: Diagnosis not present

## 2021-05-12 DIAGNOSIS — M868X7 Other osteomyelitis, ankle and foot: Secondary | ICD-10-CM | POA: Diagnosis not present

## 2021-05-12 DIAGNOSIS — R937 Abnormal findings on diagnostic imaging of other parts of musculoskeletal system: Secondary | ICD-10-CM | POA: Diagnosis not present

## 2021-05-12 DIAGNOSIS — N179 Acute kidney failure, unspecified: Secondary | ICD-10-CM | POA: Diagnosis not present

## 2021-05-12 DIAGNOSIS — I120 Hypertensive chronic kidney disease with stage 5 chronic kidney disease or end stage renal disease: Secondary | ICD-10-CM | POA: Diagnosis not present

## 2021-05-12 DIAGNOSIS — Z992 Dependence on renal dialysis: Secondary | ICD-10-CM | POA: Diagnosis not present

## 2021-05-12 DIAGNOSIS — T8132XA Disruption of internal operation (surgical) wound, not elsewhere classified, initial encounter: Secondary | ICD-10-CM | POA: Diagnosis not present

## 2021-05-12 DIAGNOSIS — R936 Abnormal findings on diagnostic imaging of limbs: Secondary | ICD-10-CM | POA: Diagnosis not present

## 2021-05-12 DIAGNOSIS — I1311 Hypertensive heart and chronic kidney disease without heart failure, with stage 5 chronic kidney disease, or end stage renal disease: Secondary | ICD-10-CM | POA: Diagnosis not present

## 2021-05-12 DIAGNOSIS — N186 End stage renal disease: Secondary | ICD-10-CM | POA: Diagnosis not present

## 2021-05-12 DIAGNOSIS — R9431 Abnormal electrocardiogram [ECG] [EKG]: Secondary | ICD-10-CM | POA: Diagnosis not present

## 2021-05-12 DIAGNOSIS — E1122 Type 2 diabetes mellitus with diabetic chronic kidney disease: Secondary | ICD-10-CM | POA: Diagnosis not present

## 2021-05-12 DIAGNOSIS — Z743 Need for continuous supervision: Secondary | ICD-10-CM | POA: Diagnosis not present

## 2021-05-12 DIAGNOSIS — X58XXXA Exposure to other specified factors, initial encounter: Secondary | ICD-10-CM | POA: Diagnosis not present

## 2021-05-12 DIAGNOSIS — I959 Hypotension, unspecified: Secondary | ICD-10-CM | POA: Diagnosis not present

## 2021-05-12 DIAGNOSIS — M86171 Other acute osteomyelitis, right ankle and foot: Secondary | ICD-10-CM | POA: Diagnosis not present

## 2021-05-12 DIAGNOSIS — E11621 Type 2 diabetes mellitus with foot ulcer: Secondary | ICD-10-CM | POA: Diagnosis not present

## 2021-05-12 DIAGNOSIS — Z95828 Presence of other vascular implants and grafts: Secondary | ICD-10-CM | POA: Diagnosis not present

## 2021-05-12 DIAGNOSIS — I70261 Atherosclerosis of native arteries of extremities with gangrene, right leg: Secondary | ICD-10-CM | POA: Diagnosis not present

## 2021-05-12 DIAGNOSIS — R61 Generalized hyperhidrosis: Secondary | ICD-10-CM | POA: Diagnosis not present

## 2021-05-12 DIAGNOSIS — R079 Chest pain, unspecified: Secondary | ICD-10-CM | POA: Diagnosis not present

## 2021-05-12 DIAGNOSIS — E1169 Type 2 diabetes mellitus with other specified complication: Secondary | ICD-10-CM | POA: Diagnosis not present

## 2021-05-12 DIAGNOSIS — I96 Gangrene, not elsewhere classified: Secondary | ICD-10-CM | POA: Diagnosis not present

## 2021-05-12 DIAGNOSIS — E1142 Type 2 diabetes mellitus with diabetic polyneuropathy: Secondary | ICD-10-CM | POA: Diagnosis not present

## 2021-05-12 DIAGNOSIS — D696 Thrombocytopenia, unspecified: Secondary | ICD-10-CM | POA: Diagnosis not present

## 2021-05-12 DIAGNOSIS — Z87891 Personal history of nicotine dependence: Secondary | ICD-10-CM | POA: Diagnosis not present

## 2021-05-12 DIAGNOSIS — R1084 Generalized abdominal pain: Secondary | ICD-10-CM | POA: Diagnosis not present

## 2021-05-12 DIAGNOSIS — I7092 Chronic total occlusion of artery of the extremities: Secondary | ICD-10-CM | POA: Diagnosis not present

## 2021-05-12 DIAGNOSIS — M84474A Pathological fracture, right foot, initial encounter for fracture: Secondary | ICD-10-CM | POA: Diagnosis not present

## 2021-05-12 DIAGNOSIS — E875 Hyperkalemia: Secondary | ICD-10-CM | POA: Diagnosis not present

## 2021-05-12 DIAGNOSIS — Z7901 Long term (current) use of anticoagulants: Secondary | ICD-10-CM | POA: Diagnosis not present

## 2021-05-12 DIAGNOSIS — I132 Hypertensive heart and chronic kidney disease with heart failure and with stage 5 chronic kidney disease, or end stage renal disease: Secondary | ICD-10-CM | POA: Diagnosis not present

## 2021-05-12 DIAGNOSIS — E1143 Type 2 diabetes mellitus with diabetic autonomic (poly)neuropathy: Secondary | ICD-10-CM | POA: Diagnosis not present

## 2021-05-12 DIAGNOSIS — I480 Paroxysmal atrial fibrillation: Secondary | ICD-10-CM | POA: Diagnosis not present

## 2021-05-12 DIAGNOSIS — E1152 Type 2 diabetes mellitus with diabetic peripheral angiopathy with gangrene: Secondary | ICD-10-CM | POA: Diagnosis not present

## 2021-05-12 DIAGNOSIS — L97518 Non-pressure chronic ulcer of other part of right foot with other specified severity: Secondary | ICD-10-CM | POA: Diagnosis not present

## 2021-05-12 DIAGNOSIS — L97428 Non-pressure chronic ulcer of left heel and midfoot with other specified severity: Secondary | ICD-10-CM | POA: Diagnosis not present

## 2021-05-12 DIAGNOSIS — E441 Mild protein-calorie malnutrition: Secondary | ICD-10-CM | POA: Diagnosis not present

## 2021-05-12 DIAGNOSIS — K3184 Gastroparesis: Secondary | ICD-10-CM | POA: Diagnosis not present

## 2021-05-12 DIAGNOSIS — B999 Unspecified infectious disease: Secondary | ICD-10-CM | POA: Diagnosis not present

## 2021-05-12 DIAGNOSIS — E1140 Type 2 diabetes mellitus with diabetic neuropathy, unspecified: Secondary | ICD-10-CM | POA: Diagnosis not present

## 2021-05-12 DIAGNOSIS — Z4889 Encounter for other specified surgical aftercare: Secondary | ICD-10-CM | POA: Diagnosis not present

## 2021-05-21 DIAGNOSIS — Z741 Need for assistance with personal care: Secondary | ICD-10-CM | POA: Diagnosis not present

## 2021-05-21 DIAGNOSIS — L89619 Pressure ulcer of right heel, unspecified stage: Secondary | ICD-10-CM | POA: Diagnosis not present

## 2021-05-21 DIAGNOSIS — E1165 Type 2 diabetes mellitus with hyperglycemia: Secondary | ICD-10-CM | POA: Diagnosis not present

## 2021-05-21 DIAGNOSIS — L97411 Non-pressure chronic ulcer of right heel and midfoot limited to breakdown of skin: Secondary | ICD-10-CM | POA: Diagnosis not present

## 2021-05-21 DIAGNOSIS — R197 Diarrhea, unspecified: Secondary | ICD-10-CM | POA: Diagnosis not present

## 2021-05-21 DIAGNOSIS — Z9889 Other specified postprocedural states: Secondary | ICD-10-CM | POA: Diagnosis not present

## 2021-05-21 DIAGNOSIS — Z992 Dependence on renal dialysis: Secondary | ICD-10-CM | POA: Diagnosis not present

## 2021-05-21 DIAGNOSIS — F1120 Opioid dependence, uncomplicated: Secondary | ICD-10-CM | POA: Diagnosis not present

## 2021-05-21 DIAGNOSIS — R062 Wheezing: Secondary | ICD-10-CM | POA: Diagnosis not present

## 2021-05-21 DIAGNOSIS — M6281 Muscle weakness (generalized): Secondary | ICD-10-CM | POA: Diagnosis not present

## 2021-05-21 DIAGNOSIS — M792 Neuralgia and neuritis, unspecified: Secondary | ICD-10-CM | POA: Diagnosis not present

## 2021-05-21 DIAGNOSIS — E11621 Type 2 diabetes mellitus with foot ulcer: Secondary | ICD-10-CM | POA: Diagnosis not present

## 2021-05-21 DIAGNOSIS — Z8679 Personal history of other diseases of the circulatory system: Secondary | ICD-10-CM | POA: Diagnosis not present

## 2021-05-21 DIAGNOSIS — N184 Chronic kidney disease, stage 4 (severe): Secondary | ICD-10-CM | POA: Diagnosis not present

## 2021-05-21 DIAGNOSIS — I739 Peripheral vascular disease, unspecified: Secondary | ICD-10-CM | POA: Diagnosis not present

## 2021-05-21 DIAGNOSIS — N2581 Secondary hyperparathyroidism of renal origin: Secondary | ICD-10-CM | POA: Diagnosis not present

## 2021-05-21 DIAGNOSIS — I96 Gangrene, not elsewhere classified: Secondary | ICD-10-CM | POA: Diagnosis not present

## 2021-05-21 DIAGNOSIS — S91109D Unspecified open wound of unspecified toe(s) without damage to nail, subsequent encounter: Secondary | ICD-10-CM | POA: Diagnosis not present

## 2021-05-21 DIAGNOSIS — E1322 Other specified diabetes mellitus with diabetic chronic kidney disease: Secondary | ICD-10-CM | POA: Diagnosis not present

## 2021-05-21 DIAGNOSIS — D631 Anemia in chronic kidney disease: Secondary | ICD-10-CM | POA: Diagnosis not present

## 2021-05-21 DIAGNOSIS — N186 End stage renal disease: Secondary | ICD-10-CM | POA: Diagnosis not present

## 2021-05-21 DIAGNOSIS — M86679 Other chronic osteomyelitis, unspecified ankle and foot: Secondary | ICD-10-CM | POA: Diagnosis not present

## 2021-05-21 DIAGNOSIS — L97508 Non-pressure chronic ulcer of other part of unspecified foot with other specified severity: Secondary | ICD-10-CM | POA: Diagnosis not present

## 2021-05-21 DIAGNOSIS — E875 Hyperkalemia: Secondary | ICD-10-CM | POA: Diagnosis not present

## 2021-05-21 DIAGNOSIS — Z4889 Encounter for other specified surgical aftercare: Secondary | ICD-10-CM | POA: Diagnosis not present

## 2021-05-21 DIAGNOSIS — Z89431 Acquired absence of right foot: Secondary | ICD-10-CM | POA: Diagnosis not present

## 2021-05-21 DIAGNOSIS — R29898 Other symptoms and signs involving the musculoskeletal system: Secondary | ICD-10-CM | POA: Diagnosis not present

## 2021-05-21 DIAGNOSIS — R2689 Other abnormalities of gait and mobility: Secondary | ICD-10-CM | POA: Diagnosis not present

## 2021-05-21 DIAGNOSIS — R109 Unspecified abdominal pain: Secondary | ICD-10-CM | POA: Diagnosis not present

## 2021-05-21 DIAGNOSIS — Z794 Long term (current) use of insulin: Secondary | ICD-10-CM | POA: Diagnosis not present

## 2021-05-21 DIAGNOSIS — L89610 Pressure ulcer of right heel, unstageable: Secondary | ICD-10-CM | POA: Diagnosis not present

## 2021-05-21 DIAGNOSIS — I10 Essential (primary) hypertension: Secondary | ICD-10-CM | POA: Diagnosis not present

## 2021-05-21 DIAGNOSIS — E1142 Type 2 diabetes mellitus with diabetic polyneuropathy: Secondary | ICD-10-CM | POA: Diagnosis not present

## 2021-05-21 DIAGNOSIS — E119 Type 2 diabetes mellitus without complications: Secondary | ICD-10-CM | POA: Diagnosis not present

## 2021-05-21 DIAGNOSIS — N179 Acute kidney failure, unspecified: Secondary | ICD-10-CM | POA: Diagnosis not present

## 2021-05-21 DIAGNOSIS — S91301D Unspecified open wound, right foot, subsequent encounter: Secondary | ICD-10-CM | POA: Diagnosis not present

## 2021-05-21 DIAGNOSIS — M869 Osteomyelitis, unspecified: Secondary | ICD-10-CM | POA: Diagnosis not present

## 2021-05-24 DIAGNOSIS — N2581 Secondary hyperparathyroidism of renal origin: Secondary | ICD-10-CM | POA: Diagnosis not present

## 2021-05-24 DIAGNOSIS — D631 Anemia in chronic kidney disease: Secondary | ICD-10-CM | POA: Diagnosis not present

## 2021-05-24 DIAGNOSIS — Z992 Dependence on renal dialysis: Secondary | ICD-10-CM | POA: Diagnosis not present

## 2021-05-24 DIAGNOSIS — N186 End stage renal disease: Secondary | ICD-10-CM | POA: Diagnosis not present

## 2021-05-25 DIAGNOSIS — M86679 Other chronic osteomyelitis, unspecified ankle and foot: Secondary | ICD-10-CM | POA: Diagnosis not present

## 2021-05-25 DIAGNOSIS — E119 Type 2 diabetes mellitus without complications: Secondary | ICD-10-CM | POA: Diagnosis not present

## 2021-05-25 DIAGNOSIS — E11621 Type 2 diabetes mellitus with foot ulcer: Secondary | ICD-10-CM | POA: Diagnosis not present

## 2021-05-25 DIAGNOSIS — N184 Chronic kidney disease, stage 4 (severe): Secondary | ICD-10-CM | POA: Diagnosis not present

## 2021-05-25 DIAGNOSIS — L97508 Non-pressure chronic ulcer of other part of unspecified foot with other specified severity: Secondary | ICD-10-CM | POA: Diagnosis not present

## 2021-05-26 DIAGNOSIS — D631 Anemia in chronic kidney disease: Secondary | ICD-10-CM | POA: Diagnosis not present

## 2021-05-26 DIAGNOSIS — N186 End stage renal disease: Secondary | ICD-10-CM | POA: Diagnosis not present

## 2021-05-26 DIAGNOSIS — N2581 Secondary hyperparathyroidism of renal origin: Secondary | ICD-10-CM | POA: Diagnosis not present

## 2021-05-26 DIAGNOSIS — Z992 Dependence on renal dialysis: Secondary | ICD-10-CM | POA: Diagnosis not present

## 2021-05-28 DIAGNOSIS — N186 End stage renal disease: Secondary | ICD-10-CM | POA: Diagnosis not present

## 2021-05-28 DIAGNOSIS — Z992 Dependence on renal dialysis: Secondary | ICD-10-CM | POA: Diagnosis not present

## 2021-05-28 DIAGNOSIS — D631 Anemia in chronic kidney disease: Secondary | ICD-10-CM | POA: Diagnosis not present

## 2021-05-28 DIAGNOSIS — N2581 Secondary hyperparathyroidism of renal origin: Secondary | ICD-10-CM | POA: Diagnosis not present

## 2021-05-31 DIAGNOSIS — Z992 Dependence on renal dialysis: Secondary | ICD-10-CM | POA: Diagnosis not present

## 2021-05-31 DIAGNOSIS — E119 Type 2 diabetes mellitus without complications: Secondary | ICD-10-CM | POA: Diagnosis not present

## 2021-05-31 DIAGNOSIS — S91301D Unspecified open wound, right foot, subsequent encounter: Secondary | ICD-10-CM | POA: Diagnosis not present

## 2021-05-31 DIAGNOSIS — Z4889 Encounter for other specified surgical aftercare: Secondary | ICD-10-CM | POA: Diagnosis not present

## 2021-05-31 DIAGNOSIS — N2581 Secondary hyperparathyroidism of renal origin: Secondary | ICD-10-CM | POA: Diagnosis not present

## 2021-05-31 DIAGNOSIS — M6281 Muscle weakness (generalized): Secondary | ICD-10-CM | POA: Diagnosis not present

## 2021-05-31 DIAGNOSIS — L89610 Pressure ulcer of right heel, unstageable: Secondary | ICD-10-CM | POA: Diagnosis not present

## 2021-05-31 DIAGNOSIS — N186 End stage renal disease: Secondary | ICD-10-CM | POA: Diagnosis not present

## 2021-06-02 DIAGNOSIS — N186 End stage renal disease: Secondary | ICD-10-CM | POA: Diagnosis not present

## 2021-06-02 DIAGNOSIS — R062 Wheezing: Secondary | ICD-10-CM | POA: Diagnosis not present

## 2021-06-02 DIAGNOSIS — R2689 Other abnormalities of gait and mobility: Secondary | ICD-10-CM | POA: Diagnosis not present

## 2021-06-02 DIAGNOSIS — R109 Unspecified abdominal pain: Secondary | ICD-10-CM | POA: Diagnosis not present

## 2021-06-02 DIAGNOSIS — R197 Diarrhea, unspecified: Secondary | ICD-10-CM | POA: Diagnosis not present

## 2021-06-03 DIAGNOSIS — N2581 Secondary hyperparathyroidism of renal origin: Secondary | ICD-10-CM | POA: Diagnosis not present

## 2021-06-03 DIAGNOSIS — Z992 Dependence on renal dialysis: Secondary | ICD-10-CM | POA: Diagnosis not present

## 2021-06-03 DIAGNOSIS — N186 End stage renal disease: Secondary | ICD-10-CM | POA: Diagnosis not present

## 2021-06-04 DIAGNOSIS — N186 End stage renal disease: Secondary | ICD-10-CM | POA: Diagnosis not present

## 2021-06-04 DIAGNOSIS — N2581 Secondary hyperparathyroidism of renal origin: Secondary | ICD-10-CM | POA: Diagnosis not present

## 2021-06-04 DIAGNOSIS — Z992 Dependence on renal dialysis: Secondary | ICD-10-CM | POA: Diagnosis not present

## 2021-06-05 DIAGNOSIS — F1120 Opioid dependence, uncomplicated: Secondary | ICD-10-CM | POA: Diagnosis not present

## 2021-06-05 DIAGNOSIS — R69 Illness, unspecified: Secondary | ICD-10-CM | POA: Diagnosis not present

## 2021-06-05 DIAGNOSIS — E1122 Type 2 diabetes mellitus with diabetic chronic kidney disease: Secondary | ICD-10-CM | POA: Diagnosis not present

## 2021-06-05 DIAGNOSIS — Z7982 Long term (current) use of aspirin: Secondary | ICD-10-CM | POA: Diagnosis not present

## 2021-06-05 DIAGNOSIS — Z4781 Encounter for orthopedic aftercare following surgical amputation: Secondary | ICD-10-CM | POA: Diagnosis not present

## 2021-06-05 DIAGNOSIS — I739 Peripheral vascular disease, unspecified: Secondary | ICD-10-CM | POA: Diagnosis not present

## 2021-06-05 DIAGNOSIS — L89612 Pressure ulcer of right heel, stage 2: Secondary | ICD-10-CM | POA: Diagnosis not present

## 2021-06-05 DIAGNOSIS — Z992 Dependence on renal dialysis: Secondary | ICD-10-CM | POA: Diagnosis not present

## 2021-06-05 DIAGNOSIS — Z7901 Long term (current) use of anticoagulants: Secondary | ICD-10-CM | POA: Diagnosis not present

## 2021-06-05 DIAGNOSIS — I96 Gangrene, not elsewhere classified: Secondary | ICD-10-CM | POA: Diagnosis not present

## 2021-06-05 DIAGNOSIS — Z89421 Acquired absence of other right toe(s): Secondary | ICD-10-CM | POA: Diagnosis not present

## 2021-06-05 DIAGNOSIS — E1151 Type 2 diabetes mellitus with diabetic peripheral angiopathy without gangrene: Secondary | ICD-10-CM | POA: Diagnosis not present

## 2021-06-05 DIAGNOSIS — N186 End stage renal disease: Secondary | ICD-10-CM | POA: Diagnosis not present

## 2021-06-05 DIAGNOSIS — I120 Hypertensive chronic kidney disease with stage 5 chronic kidney disease or end stage renal disease: Secondary | ICD-10-CM | POA: Diagnosis not present

## 2021-06-05 DIAGNOSIS — Z794 Long term (current) use of insulin: Secondary | ICD-10-CM | POA: Diagnosis not present

## 2021-06-07 ENCOUNTER — Encounter: Payer: Self-pay | Admitting: *Deleted

## 2021-06-07 DIAGNOSIS — Z992 Dependence on renal dialysis: Secondary | ICD-10-CM | POA: Diagnosis not present

## 2021-06-07 DIAGNOSIS — M869 Osteomyelitis, unspecified: Secondary | ICD-10-CM | POA: Diagnosis not present

## 2021-06-07 DIAGNOSIS — E1122 Type 2 diabetes mellitus with diabetic chronic kidney disease: Secondary | ICD-10-CM | POA: Diagnosis not present

## 2021-06-07 DIAGNOSIS — D509 Iron deficiency anemia, unspecified: Secondary | ICD-10-CM | POA: Diagnosis not present

## 2021-06-07 DIAGNOSIS — N186 End stage renal disease: Secondary | ICD-10-CM | POA: Diagnosis not present

## 2021-06-07 DIAGNOSIS — N2581 Secondary hyperparathyroidism of renal origin: Secondary | ICD-10-CM | POA: Diagnosis not present

## 2021-06-08 DIAGNOSIS — F1120 Opioid dependence, uncomplicated: Secondary | ICD-10-CM | POA: Diagnosis not present

## 2021-06-08 DIAGNOSIS — Z794 Long term (current) use of insulin: Secondary | ICD-10-CM | POA: Diagnosis not present

## 2021-06-08 DIAGNOSIS — I96 Gangrene, not elsewhere classified: Secondary | ICD-10-CM | POA: Diagnosis not present

## 2021-06-08 DIAGNOSIS — L89610 Pressure ulcer of right heel, unstageable: Secondary | ICD-10-CM | POA: Diagnosis not present

## 2021-06-08 DIAGNOSIS — Z7982 Long term (current) use of aspirin: Secondary | ICD-10-CM | POA: Diagnosis not present

## 2021-06-08 DIAGNOSIS — I120 Hypertensive chronic kidney disease with stage 5 chronic kidney disease or end stage renal disease: Secondary | ICD-10-CM | POA: Diagnosis not present

## 2021-06-08 DIAGNOSIS — Z7901 Long term (current) use of anticoagulants: Secondary | ICD-10-CM | POA: Diagnosis not present

## 2021-06-08 DIAGNOSIS — E1151 Type 2 diabetes mellitus with diabetic peripheral angiopathy without gangrene: Secondary | ICD-10-CM | POA: Diagnosis not present

## 2021-06-08 DIAGNOSIS — Z4781 Encounter for orthopedic aftercare following surgical amputation: Secondary | ICD-10-CM | POA: Diagnosis not present

## 2021-06-08 DIAGNOSIS — E1122 Type 2 diabetes mellitus with diabetic chronic kidney disease: Secondary | ICD-10-CM | POA: Diagnosis not present

## 2021-06-08 DIAGNOSIS — R69 Illness, unspecified: Secondary | ICD-10-CM | POA: Diagnosis not present

## 2021-06-08 DIAGNOSIS — L89612 Pressure ulcer of right heel, stage 2: Secondary | ICD-10-CM | POA: Diagnosis not present

## 2021-06-08 DIAGNOSIS — Z992 Dependence on renal dialysis: Secondary | ICD-10-CM | POA: Diagnosis not present

## 2021-06-08 DIAGNOSIS — N186 End stage renal disease: Secondary | ICD-10-CM | POA: Diagnosis not present

## 2021-06-08 DIAGNOSIS — I739 Peripheral vascular disease, unspecified: Secondary | ICD-10-CM | POA: Diagnosis not present

## 2021-06-08 DIAGNOSIS — Z89421 Acquired absence of other right toe(s): Secondary | ICD-10-CM | POA: Diagnosis not present

## 2021-06-08 DIAGNOSIS — E11621 Type 2 diabetes mellitus with foot ulcer: Secondary | ICD-10-CM | POA: Diagnosis not present

## 2021-06-09 DIAGNOSIS — Z992 Dependence on renal dialysis: Secondary | ICD-10-CM | POA: Diagnosis not present

## 2021-06-09 DIAGNOSIS — E1122 Type 2 diabetes mellitus with diabetic chronic kidney disease: Secondary | ICD-10-CM | POA: Diagnosis not present

## 2021-06-09 DIAGNOSIS — D509 Iron deficiency anemia, unspecified: Secondary | ICD-10-CM | POA: Diagnosis not present

## 2021-06-09 DIAGNOSIS — N2581 Secondary hyperparathyroidism of renal origin: Secondary | ICD-10-CM | POA: Diagnosis not present

## 2021-06-09 DIAGNOSIS — N186 End stage renal disease: Secondary | ICD-10-CM | POA: Diagnosis not present

## 2021-06-09 DIAGNOSIS — M869 Osteomyelitis, unspecified: Secondary | ICD-10-CM | POA: Diagnosis not present

## 2021-06-10 DIAGNOSIS — I739 Peripheral vascular disease, unspecified: Secondary | ICD-10-CM | POA: Diagnosis not present

## 2021-06-10 DIAGNOSIS — Z992 Dependence on renal dialysis: Secondary | ICD-10-CM | POA: Diagnosis not present

## 2021-06-10 DIAGNOSIS — L89612 Pressure ulcer of right heel, stage 2: Secondary | ICD-10-CM | POA: Diagnosis not present

## 2021-06-10 DIAGNOSIS — M86371 Chronic multifocal osteomyelitis, right ankle and foot: Secondary | ICD-10-CM | POA: Diagnosis not present

## 2021-06-10 DIAGNOSIS — Z794 Long term (current) use of insulin: Secondary | ICD-10-CM | POA: Diagnosis not present

## 2021-06-10 DIAGNOSIS — R69 Illness, unspecified: Secondary | ICD-10-CM | POA: Diagnosis not present

## 2021-06-10 DIAGNOSIS — E1122 Type 2 diabetes mellitus with diabetic chronic kidney disease: Secondary | ICD-10-CM | POA: Diagnosis not present

## 2021-06-10 DIAGNOSIS — N186 End stage renal disease: Secondary | ICD-10-CM | POA: Diagnosis not present

## 2021-06-10 DIAGNOSIS — Z7982 Long term (current) use of aspirin: Secondary | ICD-10-CM | POA: Diagnosis not present

## 2021-06-10 DIAGNOSIS — Z89421 Acquired absence of other right toe(s): Secondary | ICD-10-CM | POA: Diagnosis not present

## 2021-06-10 DIAGNOSIS — I120 Hypertensive chronic kidney disease with stage 5 chronic kidney disease or end stage renal disease: Secondary | ICD-10-CM | POA: Diagnosis not present

## 2021-06-10 DIAGNOSIS — I96 Gangrene, not elsewhere classified: Secondary | ICD-10-CM | POA: Diagnosis not present

## 2021-06-10 DIAGNOSIS — Z7901 Long term (current) use of anticoagulants: Secondary | ICD-10-CM | POA: Diagnosis not present

## 2021-06-10 DIAGNOSIS — Z792 Long term (current) use of antibiotics: Secondary | ICD-10-CM | POA: Diagnosis not present

## 2021-06-10 DIAGNOSIS — F1120 Opioid dependence, uncomplicated: Secondary | ICD-10-CM | POA: Diagnosis not present

## 2021-06-10 DIAGNOSIS — Z4781 Encounter for orthopedic aftercare following surgical amputation: Secondary | ICD-10-CM | POA: Diagnosis not present

## 2021-06-10 DIAGNOSIS — E1151 Type 2 diabetes mellitus with diabetic peripheral angiopathy without gangrene: Secondary | ICD-10-CM | POA: Diagnosis not present

## 2021-06-11 DIAGNOSIS — Z992 Dependence on renal dialysis: Secondary | ICD-10-CM | POA: Diagnosis not present

## 2021-06-11 DIAGNOSIS — N2581 Secondary hyperparathyroidism of renal origin: Secondary | ICD-10-CM | POA: Diagnosis not present

## 2021-06-11 DIAGNOSIS — E1122 Type 2 diabetes mellitus with diabetic chronic kidney disease: Secondary | ICD-10-CM | POA: Diagnosis not present

## 2021-06-11 DIAGNOSIS — N186 End stage renal disease: Secondary | ICD-10-CM | POA: Diagnosis not present

## 2021-06-11 DIAGNOSIS — D509 Iron deficiency anemia, unspecified: Secondary | ICD-10-CM | POA: Diagnosis not present

## 2021-06-11 DIAGNOSIS — M869 Osteomyelitis, unspecified: Secondary | ICD-10-CM | POA: Diagnosis not present

## 2021-06-13 DIAGNOSIS — Z4781 Encounter for orthopedic aftercare following surgical amputation: Secondary | ICD-10-CM | POA: Diagnosis not present

## 2021-06-13 DIAGNOSIS — R69 Illness, unspecified: Secondary | ICD-10-CM | POA: Diagnosis not present

## 2021-06-13 DIAGNOSIS — I120 Hypertensive chronic kidney disease with stage 5 chronic kidney disease or end stage renal disease: Secondary | ICD-10-CM | POA: Diagnosis not present

## 2021-06-13 DIAGNOSIS — Z7982 Long term (current) use of aspirin: Secondary | ICD-10-CM | POA: Diagnosis not present

## 2021-06-13 DIAGNOSIS — L89612 Pressure ulcer of right heel, stage 2: Secondary | ICD-10-CM | POA: Diagnosis not present

## 2021-06-13 DIAGNOSIS — Z89421 Acquired absence of other right toe(s): Secondary | ICD-10-CM | POA: Diagnosis not present

## 2021-06-13 DIAGNOSIS — I739 Peripheral vascular disease, unspecified: Secondary | ICD-10-CM | POA: Diagnosis not present

## 2021-06-13 DIAGNOSIS — Z794 Long term (current) use of insulin: Secondary | ICD-10-CM | POA: Diagnosis not present

## 2021-06-13 DIAGNOSIS — I96 Gangrene, not elsewhere classified: Secondary | ICD-10-CM | POA: Diagnosis not present

## 2021-06-13 DIAGNOSIS — Z7901 Long term (current) use of anticoagulants: Secondary | ICD-10-CM | POA: Diagnosis not present

## 2021-06-13 DIAGNOSIS — F1120 Opioid dependence, uncomplicated: Secondary | ICD-10-CM | POA: Diagnosis not present

## 2021-06-13 DIAGNOSIS — N186 End stage renal disease: Secondary | ICD-10-CM | POA: Diagnosis not present

## 2021-06-13 DIAGNOSIS — Z992 Dependence on renal dialysis: Secondary | ICD-10-CM | POA: Diagnosis not present

## 2021-06-13 DIAGNOSIS — E1151 Type 2 diabetes mellitus with diabetic peripheral angiopathy without gangrene: Secondary | ICD-10-CM | POA: Diagnosis not present

## 2021-06-13 DIAGNOSIS — E1122 Type 2 diabetes mellitus with diabetic chronic kidney disease: Secondary | ICD-10-CM | POA: Diagnosis not present

## 2021-06-14 DIAGNOSIS — N2581 Secondary hyperparathyroidism of renal origin: Secondary | ICD-10-CM | POA: Diagnosis not present

## 2021-06-14 DIAGNOSIS — Z992 Dependence on renal dialysis: Secondary | ICD-10-CM | POA: Diagnosis not present

## 2021-06-14 DIAGNOSIS — N186 End stage renal disease: Secondary | ICD-10-CM | POA: Diagnosis not present

## 2021-06-14 DIAGNOSIS — M869 Osteomyelitis, unspecified: Secondary | ICD-10-CM | POA: Diagnosis not present

## 2021-06-15 DIAGNOSIS — N179 Acute kidney failure, unspecified: Secondary | ICD-10-CM | POA: Diagnosis not present

## 2021-06-15 DIAGNOSIS — N186 End stage renal disease: Secondary | ICD-10-CM | POA: Diagnosis not present

## 2021-06-15 DIAGNOSIS — I5032 Chronic diastolic (congestive) heart failure: Secondary | ICD-10-CM | POA: Diagnosis not present

## 2021-06-15 DIAGNOSIS — Z992 Dependence on renal dialysis: Secondary | ICD-10-CM | POA: Diagnosis not present

## 2021-06-15 DIAGNOSIS — I480 Paroxysmal atrial fibrillation: Secondary | ICD-10-CM | POA: Diagnosis not present

## 2021-06-15 DIAGNOSIS — R69 Illness, unspecified: Secondary | ICD-10-CM | POA: Diagnosis not present

## 2021-06-15 DIAGNOSIS — D696 Thrombocytopenia, unspecified: Secondary | ICD-10-CM | POA: Diagnosis not present

## 2021-06-15 DIAGNOSIS — E1322 Other specified diabetes mellitus with diabetic chronic kidney disease: Secondary | ICD-10-CM | POA: Diagnosis not present

## 2021-06-15 DIAGNOSIS — Z794 Long term (current) use of insulin: Secondary | ICD-10-CM | POA: Diagnosis not present

## 2021-06-16 DIAGNOSIS — M869 Osteomyelitis, unspecified: Secondary | ICD-10-CM | POA: Diagnosis not present

## 2021-06-16 DIAGNOSIS — Z992 Dependence on renal dialysis: Secondary | ICD-10-CM | POA: Diagnosis not present

## 2021-06-16 DIAGNOSIS — N186 End stage renal disease: Secondary | ICD-10-CM | POA: Diagnosis not present

## 2021-06-16 DIAGNOSIS — N2581 Secondary hyperparathyroidism of renal origin: Secondary | ICD-10-CM | POA: Diagnosis not present

## 2021-06-17 DIAGNOSIS — Z7982 Long term (current) use of aspirin: Secondary | ICD-10-CM | POA: Diagnosis not present

## 2021-06-17 DIAGNOSIS — Z794 Long term (current) use of insulin: Secondary | ICD-10-CM | POA: Diagnosis not present

## 2021-06-17 DIAGNOSIS — L89612 Pressure ulcer of right heel, stage 2: Secondary | ICD-10-CM | POA: Diagnosis not present

## 2021-06-17 DIAGNOSIS — F1120 Opioid dependence, uncomplicated: Secondary | ICD-10-CM | POA: Diagnosis not present

## 2021-06-17 DIAGNOSIS — Z89421 Acquired absence of other right toe(s): Secondary | ICD-10-CM | POA: Diagnosis not present

## 2021-06-17 DIAGNOSIS — Z4781 Encounter for orthopedic aftercare following surgical amputation: Secondary | ICD-10-CM | POA: Diagnosis not present

## 2021-06-17 DIAGNOSIS — I739 Peripheral vascular disease, unspecified: Secondary | ICD-10-CM | POA: Diagnosis not present

## 2021-06-17 DIAGNOSIS — N186 End stage renal disease: Secondary | ICD-10-CM | POA: Diagnosis not present

## 2021-06-17 DIAGNOSIS — I120 Hypertensive chronic kidney disease with stage 5 chronic kidney disease or end stage renal disease: Secondary | ICD-10-CM | POA: Diagnosis not present

## 2021-06-17 DIAGNOSIS — Z7901 Long term (current) use of anticoagulants: Secondary | ICD-10-CM | POA: Diagnosis not present

## 2021-06-17 DIAGNOSIS — R69 Illness, unspecified: Secondary | ICD-10-CM | POA: Diagnosis not present

## 2021-06-17 DIAGNOSIS — Z992 Dependence on renal dialysis: Secondary | ICD-10-CM | POA: Diagnosis not present

## 2021-06-17 DIAGNOSIS — E1122 Type 2 diabetes mellitus with diabetic chronic kidney disease: Secondary | ICD-10-CM | POA: Diagnosis not present

## 2021-06-17 DIAGNOSIS — I96 Gangrene, not elsewhere classified: Secondary | ICD-10-CM | POA: Diagnosis not present

## 2021-06-17 DIAGNOSIS — E1151 Type 2 diabetes mellitus with diabetic peripheral angiopathy without gangrene: Secondary | ICD-10-CM | POA: Diagnosis not present

## 2021-06-18 DIAGNOSIS — N2581 Secondary hyperparathyroidism of renal origin: Secondary | ICD-10-CM | POA: Diagnosis not present

## 2021-06-18 DIAGNOSIS — M869 Osteomyelitis, unspecified: Secondary | ICD-10-CM | POA: Diagnosis not present

## 2021-06-18 DIAGNOSIS — N186 End stage renal disease: Secondary | ICD-10-CM | POA: Diagnosis not present

## 2021-06-18 DIAGNOSIS — Z992 Dependence on renal dialysis: Secondary | ICD-10-CM | POA: Diagnosis not present

## 2021-06-20 DIAGNOSIS — E1122 Type 2 diabetes mellitus with diabetic chronic kidney disease: Secondary | ICD-10-CM | POA: Diagnosis not present

## 2021-06-20 DIAGNOSIS — N184 Chronic kidney disease, stage 4 (severe): Secondary | ICD-10-CM | POA: Diagnosis not present

## 2021-06-20 DIAGNOSIS — Z7982 Long term (current) use of aspirin: Secondary | ICD-10-CM | POA: Diagnosis not present

## 2021-06-20 DIAGNOSIS — I96 Gangrene, not elsewhere classified: Secondary | ICD-10-CM | POA: Diagnosis not present

## 2021-06-20 DIAGNOSIS — I739 Peripheral vascular disease, unspecified: Secondary | ICD-10-CM | POA: Diagnosis not present

## 2021-06-20 DIAGNOSIS — N186 End stage renal disease: Secondary | ICD-10-CM | POA: Diagnosis not present

## 2021-06-20 DIAGNOSIS — Z992 Dependence on renal dialysis: Secondary | ICD-10-CM | POA: Diagnosis not present

## 2021-06-20 DIAGNOSIS — R69 Illness, unspecified: Secondary | ICD-10-CM | POA: Diagnosis not present

## 2021-06-20 DIAGNOSIS — L97508 Non-pressure chronic ulcer of other part of unspecified foot with other specified severity: Secondary | ICD-10-CM | POA: Diagnosis not present

## 2021-06-20 DIAGNOSIS — Z89421 Acquired absence of other right toe(s): Secondary | ICD-10-CM | POA: Diagnosis not present

## 2021-06-20 DIAGNOSIS — E1151 Type 2 diabetes mellitus with diabetic peripheral angiopathy without gangrene: Secondary | ICD-10-CM | POA: Diagnosis not present

## 2021-06-20 DIAGNOSIS — Z7901 Long term (current) use of anticoagulants: Secondary | ICD-10-CM | POA: Diagnosis not present

## 2021-06-20 DIAGNOSIS — F1120 Opioid dependence, uncomplicated: Secondary | ICD-10-CM | POA: Diagnosis not present

## 2021-06-20 DIAGNOSIS — L89612 Pressure ulcer of right heel, stage 2: Secondary | ICD-10-CM | POA: Diagnosis not present

## 2021-06-20 DIAGNOSIS — E11621 Type 2 diabetes mellitus with foot ulcer: Secondary | ICD-10-CM | POA: Diagnosis not present

## 2021-06-20 DIAGNOSIS — I120 Hypertensive chronic kidney disease with stage 5 chronic kidney disease or end stage renal disease: Secondary | ICD-10-CM | POA: Diagnosis not present

## 2021-06-20 DIAGNOSIS — Z794 Long term (current) use of insulin: Secondary | ICD-10-CM | POA: Diagnosis not present

## 2021-06-20 DIAGNOSIS — Z4781 Encounter for orthopedic aftercare following surgical amputation: Secondary | ICD-10-CM | POA: Diagnosis not present

## 2021-06-20 DIAGNOSIS — E119 Type 2 diabetes mellitus without complications: Secondary | ICD-10-CM | POA: Diagnosis not present

## 2021-06-21 DIAGNOSIS — N2581 Secondary hyperparathyroidism of renal origin: Secondary | ICD-10-CM | POA: Diagnosis not present

## 2021-06-21 DIAGNOSIS — Z992 Dependence on renal dialysis: Secondary | ICD-10-CM | POA: Diagnosis not present

## 2021-06-21 DIAGNOSIS — M869 Osteomyelitis, unspecified: Secondary | ICD-10-CM | POA: Diagnosis not present

## 2021-06-21 DIAGNOSIS — N186 End stage renal disease: Secondary | ICD-10-CM | POA: Diagnosis not present

## 2021-06-23 DIAGNOSIS — Z992 Dependence on renal dialysis: Secondary | ICD-10-CM | POA: Diagnosis not present

## 2021-06-23 DIAGNOSIS — N186 End stage renal disease: Secondary | ICD-10-CM | POA: Diagnosis not present

## 2021-06-23 DIAGNOSIS — M869 Osteomyelitis, unspecified: Secondary | ICD-10-CM | POA: Diagnosis not present

## 2021-06-23 DIAGNOSIS — N2581 Secondary hyperparathyroidism of renal origin: Secondary | ICD-10-CM | POA: Diagnosis not present

## 2021-06-24 DIAGNOSIS — Z992 Dependence on renal dialysis: Secondary | ICD-10-CM | POA: Diagnosis not present

## 2021-06-24 DIAGNOSIS — E1151 Type 2 diabetes mellitus with diabetic peripheral angiopathy without gangrene: Secondary | ICD-10-CM | POA: Diagnosis not present

## 2021-06-24 DIAGNOSIS — Z7982 Long term (current) use of aspirin: Secondary | ICD-10-CM | POA: Diagnosis not present

## 2021-06-24 DIAGNOSIS — L89612 Pressure ulcer of right heel, stage 2: Secondary | ICD-10-CM | POA: Diagnosis not present

## 2021-06-24 DIAGNOSIS — Z7901 Long term (current) use of anticoagulants: Secondary | ICD-10-CM | POA: Diagnosis not present

## 2021-06-24 DIAGNOSIS — F1120 Opioid dependence, uncomplicated: Secondary | ICD-10-CM | POA: Diagnosis not present

## 2021-06-24 DIAGNOSIS — R69 Illness, unspecified: Secondary | ICD-10-CM | POA: Diagnosis not present

## 2021-06-24 DIAGNOSIS — E1122 Type 2 diabetes mellitus with diabetic chronic kidney disease: Secondary | ICD-10-CM | POA: Diagnosis not present

## 2021-06-24 DIAGNOSIS — Z89421 Acquired absence of other right toe(s): Secondary | ICD-10-CM | POA: Diagnosis not present

## 2021-06-24 DIAGNOSIS — I739 Peripheral vascular disease, unspecified: Secondary | ICD-10-CM | POA: Diagnosis not present

## 2021-06-24 DIAGNOSIS — Z794 Long term (current) use of insulin: Secondary | ICD-10-CM | POA: Diagnosis not present

## 2021-06-24 DIAGNOSIS — I120 Hypertensive chronic kidney disease with stage 5 chronic kidney disease or end stage renal disease: Secondary | ICD-10-CM | POA: Diagnosis not present

## 2021-06-24 DIAGNOSIS — N186 End stage renal disease: Secondary | ICD-10-CM | POA: Diagnosis not present

## 2021-06-24 DIAGNOSIS — Z4781 Encounter for orthopedic aftercare following surgical amputation: Secondary | ICD-10-CM | POA: Diagnosis not present

## 2021-06-24 DIAGNOSIS — I96 Gangrene, not elsewhere classified: Secondary | ICD-10-CM | POA: Diagnosis not present

## 2021-06-25 DIAGNOSIS — M869 Osteomyelitis, unspecified: Secondary | ICD-10-CM | POA: Diagnosis not present

## 2021-06-25 DIAGNOSIS — N2581 Secondary hyperparathyroidism of renal origin: Secondary | ICD-10-CM | POA: Diagnosis not present

## 2021-06-25 DIAGNOSIS — Z992 Dependence on renal dialysis: Secondary | ICD-10-CM | POA: Diagnosis not present

## 2021-06-25 DIAGNOSIS — N186 End stage renal disease: Secondary | ICD-10-CM | POA: Diagnosis not present

## 2021-06-27 DIAGNOSIS — E1122 Type 2 diabetes mellitus with diabetic chronic kidney disease: Secondary | ICD-10-CM | POA: Diagnosis not present

## 2021-06-27 DIAGNOSIS — Z992 Dependence on renal dialysis: Secondary | ICD-10-CM | POA: Diagnosis not present

## 2021-06-27 DIAGNOSIS — F1120 Opioid dependence, uncomplicated: Secondary | ICD-10-CM | POA: Diagnosis not present

## 2021-06-27 DIAGNOSIS — I120 Hypertensive chronic kidney disease with stage 5 chronic kidney disease or end stage renal disease: Secondary | ICD-10-CM | POA: Diagnosis not present

## 2021-06-27 DIAGNOSIS — Z794 Long term (current) use of insulin: Secondary | ICD-10-CM | POA: Diagnosis not present

## 2021-06-27 DIAGNOSIS — Z7901 Long term (current) use of anticoagulants: Secondary | ICD-10-CM | POA: Diagnosis not present

## 2021-06-27 DIAGNOSIS — Z4781 Encounter for orthopedic aftercare following surgical amputation: Secondary | ICD-10-CM | POA: Diagnosis not present

## 2021-06-27 DIAGNOSIS — Z7982 Long term (current) use of aspirin: Secondary | ICD-10-CM | POA: Diagnosis not present

## 2021-06-27 DIAGNOSIS — I96 Gangrene, not elsewhere classified: Secondary | ICD-10-CM | POA: Diagnosis not present

## 2021-06-27 DIAGNOSIS — N186 End stage renal disease: Secondary | ICD-10-CM | POA: Diagnosis not present

## 2021-06-27 DIAGNOSIS — L89612 Pressure ulcer of right heel, stage 2: Secondary | ICD-10-CM | POA: Diagnosis not present

## 2021-06-27 DIAGNOSIS — E1151 Type 2 diabetes mellitus with diabetic peripheral angiopathy without gangrene: Secondary | ICD-10-CM | POA: Diagnosis not present

## 2021-06-27 DIAGNOSIS — I739 Peripheral vascular disease, unspecified: Secondary | ICD-10-CM | POA: Diagnosis not present

## 2021-06-27 DIAGNOSIS — Z89421 Acquired absence of other right toe(s): Secondary | ICD-10-CM | POA: Diagnosis not present

## 2021-06-27 DIAGNOSIS — R69 Illness, unspecified: Secondary | ICD-10-CM | POA: Diagnosis not present

## 2021-06-28 DIAGNOSIS — N2581 Secondary hyperparathyroidism of renal origin: Secondary | ICD-10-CM | POA: Diagnosis not present

## 2021-06-28 DIAGNOSIS — M869 Osteomyelitis, unspecified: Secondary | ICD-10-CM | POA: Diagnosis not present

## 2021-06-28 DIAGNOSIS — N186 End stage renal disease: Secondary | ICD-10-CM | POA: Diagnosis not present

## 2021-06-28 DIAGNOSIS — Z992 Dependence on renal dialysis: Secondary | ICD-10-CM | POA: Diagnosis not present

## 2021-06-30 DIAGNOSIS — Z992 Dependence on renal dialysis: Secondary | ICD-10-CM | POA: Diagnosis not present

## 2021-06-30 DIAGNOSIS — N2581 Secondary hyperparathyroidism of renal origin: Secondary | ICD-10-CM | POA: Diagnosis not present

## 2021-06-30 DIAGNOSIS — N186 End stage renal disease: Secondary | ICD-10-CM | POA: Diagnosis not present

## 2021-06-30 DIAGNOSIS — M869 Osteomyelitis, unspecified: Secondary | ICD-10-CM | POA: Diagnosis not present

## 2021-07-01 DIAGNOSIS — E1151 Type 2 diabetes mellitus with diabetic peripheral angiopathy without gangrene: Secondary | ICD-10-CM | POA: Diagnosis not present

## 2021-07-01 DIAGNOSIS — I120 Hypertensive chronic kidney disease with stage 5 chronic kidney disease or end stage renal disease: Secondary | ICD-10-CM | POA: Diagnosis not present

## 2021-07-01 DIAGNOSIS — Z4781 Encounter for orthopedic aftercare following surgical amputation: Secondary | ICD-10-CM | POA: Diagnosis not present

## 2021-07-01 DIAGNOSIS — N186 End stage renal disease: Secondary | ICD-10-CM | POA: Diagnosis not present

## 2021-07-01 DIAGNOSIS — E1122 Type 2 diabetes mellitus with diabetic chronic kidney disease: Secondary | ICD-10-CM | POA: Diagnosis not present

## 2021-07-01 DIAGNOSIS — R69 Illness, unspecified: Secondary | ICD-10-CM | POA: Diagnosis not present

## 2021-07-01 DIAGNOSIS — Z794 Long term (current) use of insulin: Secondary | ICD-10-CM | POA: Diagnosis not present

## 2021-07-01 DIAGNOSIS — I96 Gangrene, not elsewhere classified: Secondary | ICD-10-CM | POA: Diagnosis not present

## 2021-07-01 DIAGNOSIS — T82858A Stenosis of vascular prosthetic devices, implants and grafts, initial encounter: Secondary | ICD-10-CM | POA: Diagnosis not present

## 2021-07-01 DIAGNOSIS — Z992 Dependence on renal dialysis: Secondary | ICD-10-CM | POA: Diagnosis not present

## 2021-07-01 DIAGNOSIS — F1120 Opioid dependence, uncomplicated: Secondary | ICD-10-CM | POA: Diagnosis not present

## 2021-07-01 DIAGNOSIS — Z7901 Long term (current) use of anticoagulants: Secondary | ICD-10-CM | POA: Diagnosis not present

## 2021-07-01 DIAGNOSIS — L89612 Pressure ulcer of right heel, stage 2: Secondary | ICD-10-CM | POA: Diagnosis not present

## 2021-07-01 DIAGNOSIS — I739 Peripheral vascular disease, unspecified: Secondary | ICD-10-CM | POA: Diagnosis not present

## 2021-07-01 DIAGNOSIS — Z89421 Acquired absence of other right toe(s): Secondary | ICD-10-CM | POA: Diagnosis not present

## 2021-07-01 DIAGNOSIS — Z7982 Long term (current) use of aspirin: Secondary | ICD-10-CM | POA: Diagnosis not present

## 2021-07-21 IMAGING — DX DG CHEST 1V PORT
1 series · 1 of 1 positions shown · non-contrast
Comparison: None

CLINICAL DATA: Shortness of breath

EXAM:
PORTABLE CHEST 1 VIEW

[chest ap]
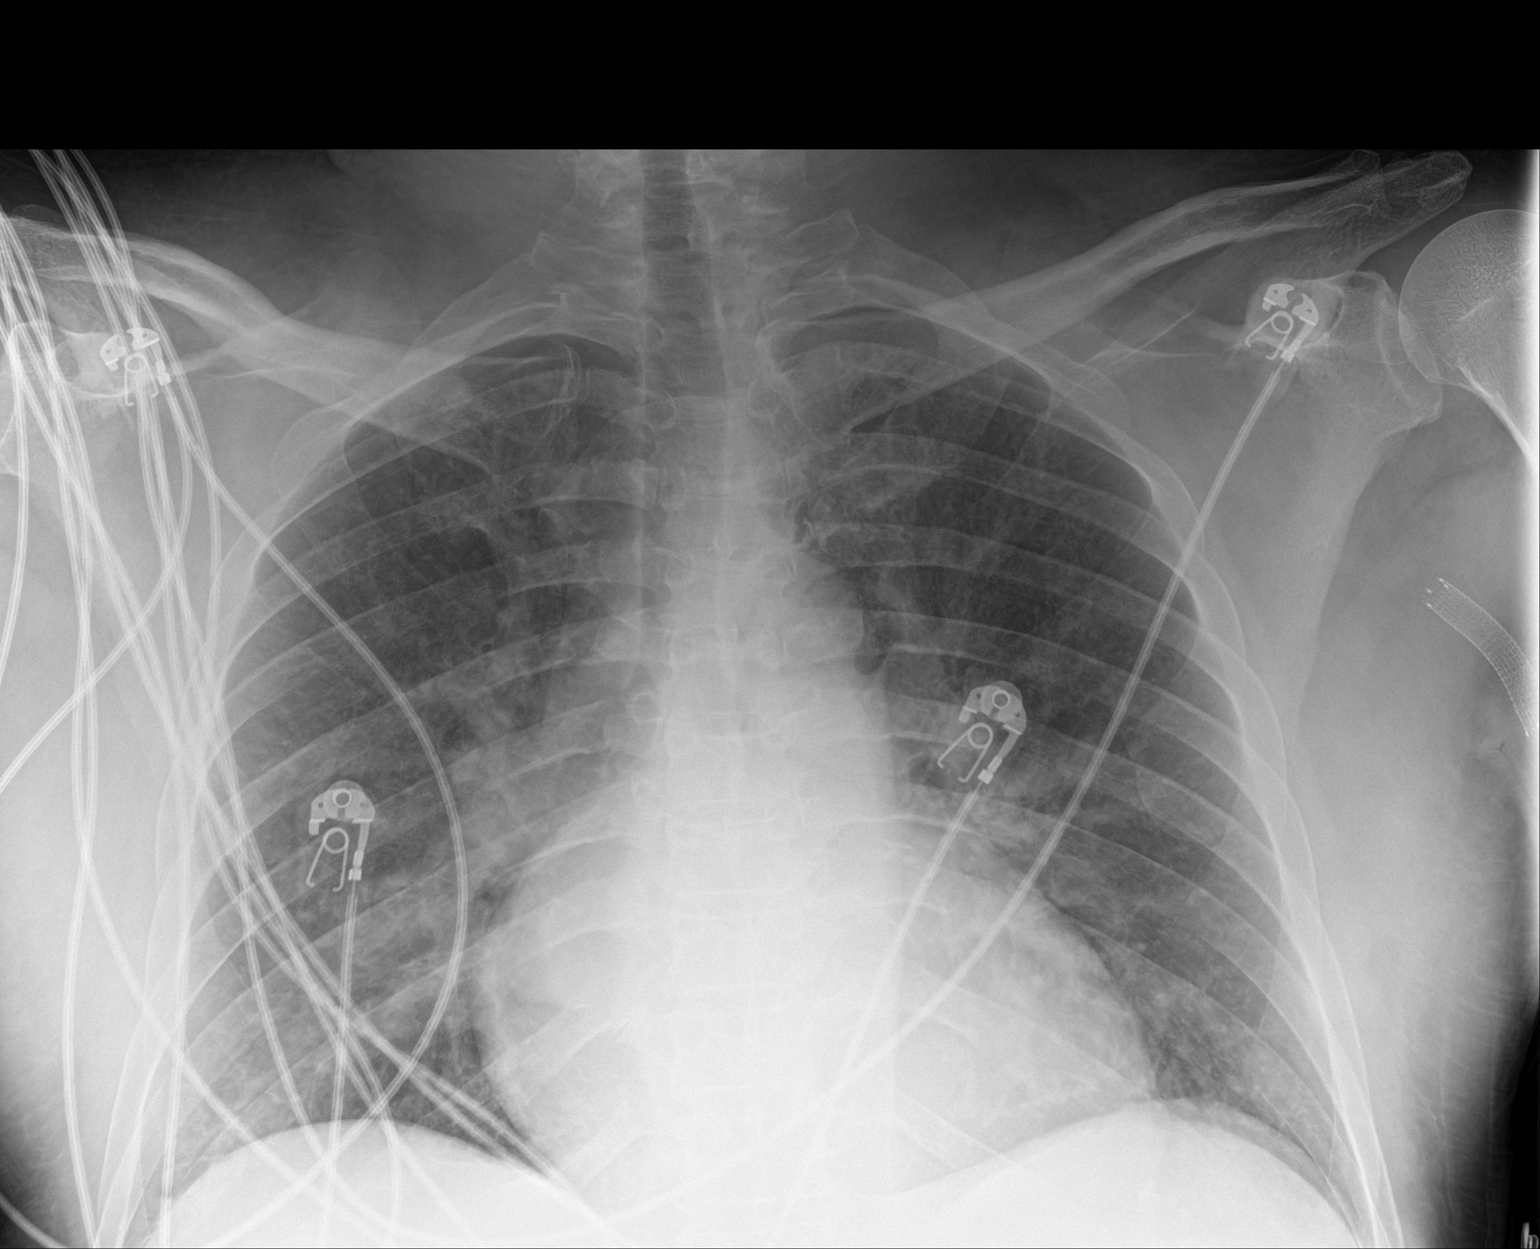

[1 of 1 positions shown; findings below may reference images not displayed]

FINDINGS: There is subtle ill-defined opacity in the right lower lobe region.
The lungs elsewhere are clear. Heart is mildly enlarged with
pulmonary vascularity normal. No adenopathy. No bone lesions.
IMPRESSION: Ill-defined airspace opacity right lower lung region concerning for
focus of pneumonia. Lungs elsewhere clear. Heart is slightly
enlarged. No demonstrable adenopathy.

## 2021-07-26 ENCOUNTER — Ambulatory Visit: Payer: Self-pay | Admitting: Licensed Clinical Social Worker

## 2021-07-26 NOTE — Patient Outreach (Signed)
  Care Coordination   Initial Visit Note   07/26/2021 Name: Massai Hankerson MRN: [id number] DOB: Dec 11, 1965  Drevin Ortner is a 56 y.o. year old male who sees Alen Bleacher, MD for primary care. SW unsuccessfully attempted all numbers on file and in chart. SW reviewed chart to determine any barriers or where SW could assist. SW will attempt patient again on 07/26.  What matters to the patients health and wellness today? Initial Visit    SDOH assessments and interventions completed:   No   Care Coordination Interventions Activated:  No Care Coordination Interventions:  No, not indicated  Follow up plan: Follow up call scheduled for 07/27/2021  Encounter Outcome:  Pt. Visit Completed

## 2021-07-27 ENCOUNTER — Ambulatory Visit: Payer: Self-pay | Admitting: Licensed Clinical Social Worker

## 2021-07-27 NOTE — Patient Outreach (Signed)
  Care Management   Follow Up Note   07/27/2021 Name: Demarko Zeimet MRN: [id number] DOB: 1965-03-02   Referred by: Alen Bleacher, MD Reason for referral : No chief complaint on file.   A second unsuccessful telephone outreach was attempted today. The patient was referred to the case management team for assistance with care management and care coordination.   Follow Up Plan: The care management team will reach out to the patient again over the next 30 days.   Lenor Derrick, MSW  Social Worker [facility] Care Management  [phone]

## 2021-10-02 IMAGING — CR DG CHEST 2V
2 series · 2 of 2 positions shown · non-contrast
Comparison: 03/16/2020

CLINICAL DATA: Ppd positive

EXAM:
CHEST - 2 VIEW

[w chest pa *]
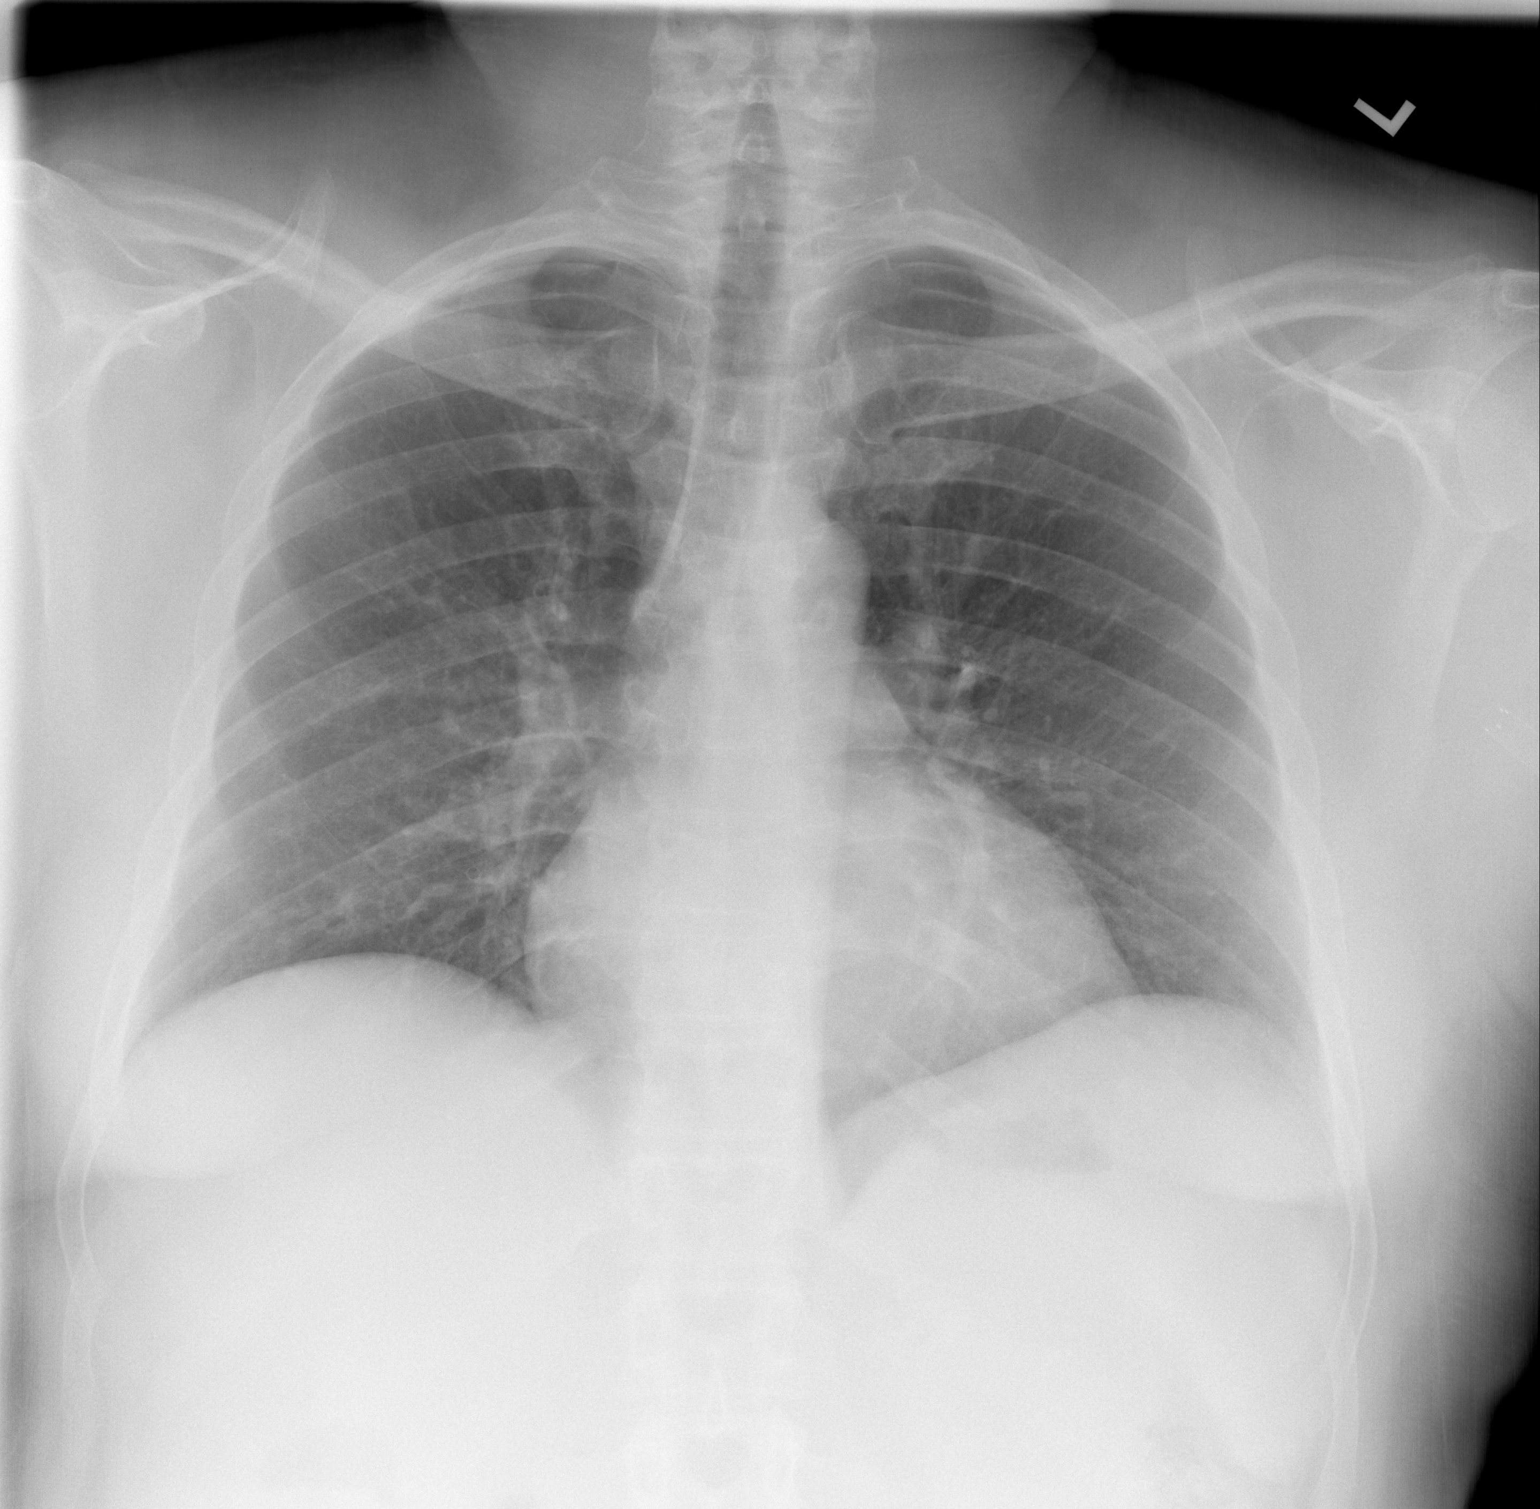

[w chest lat *]
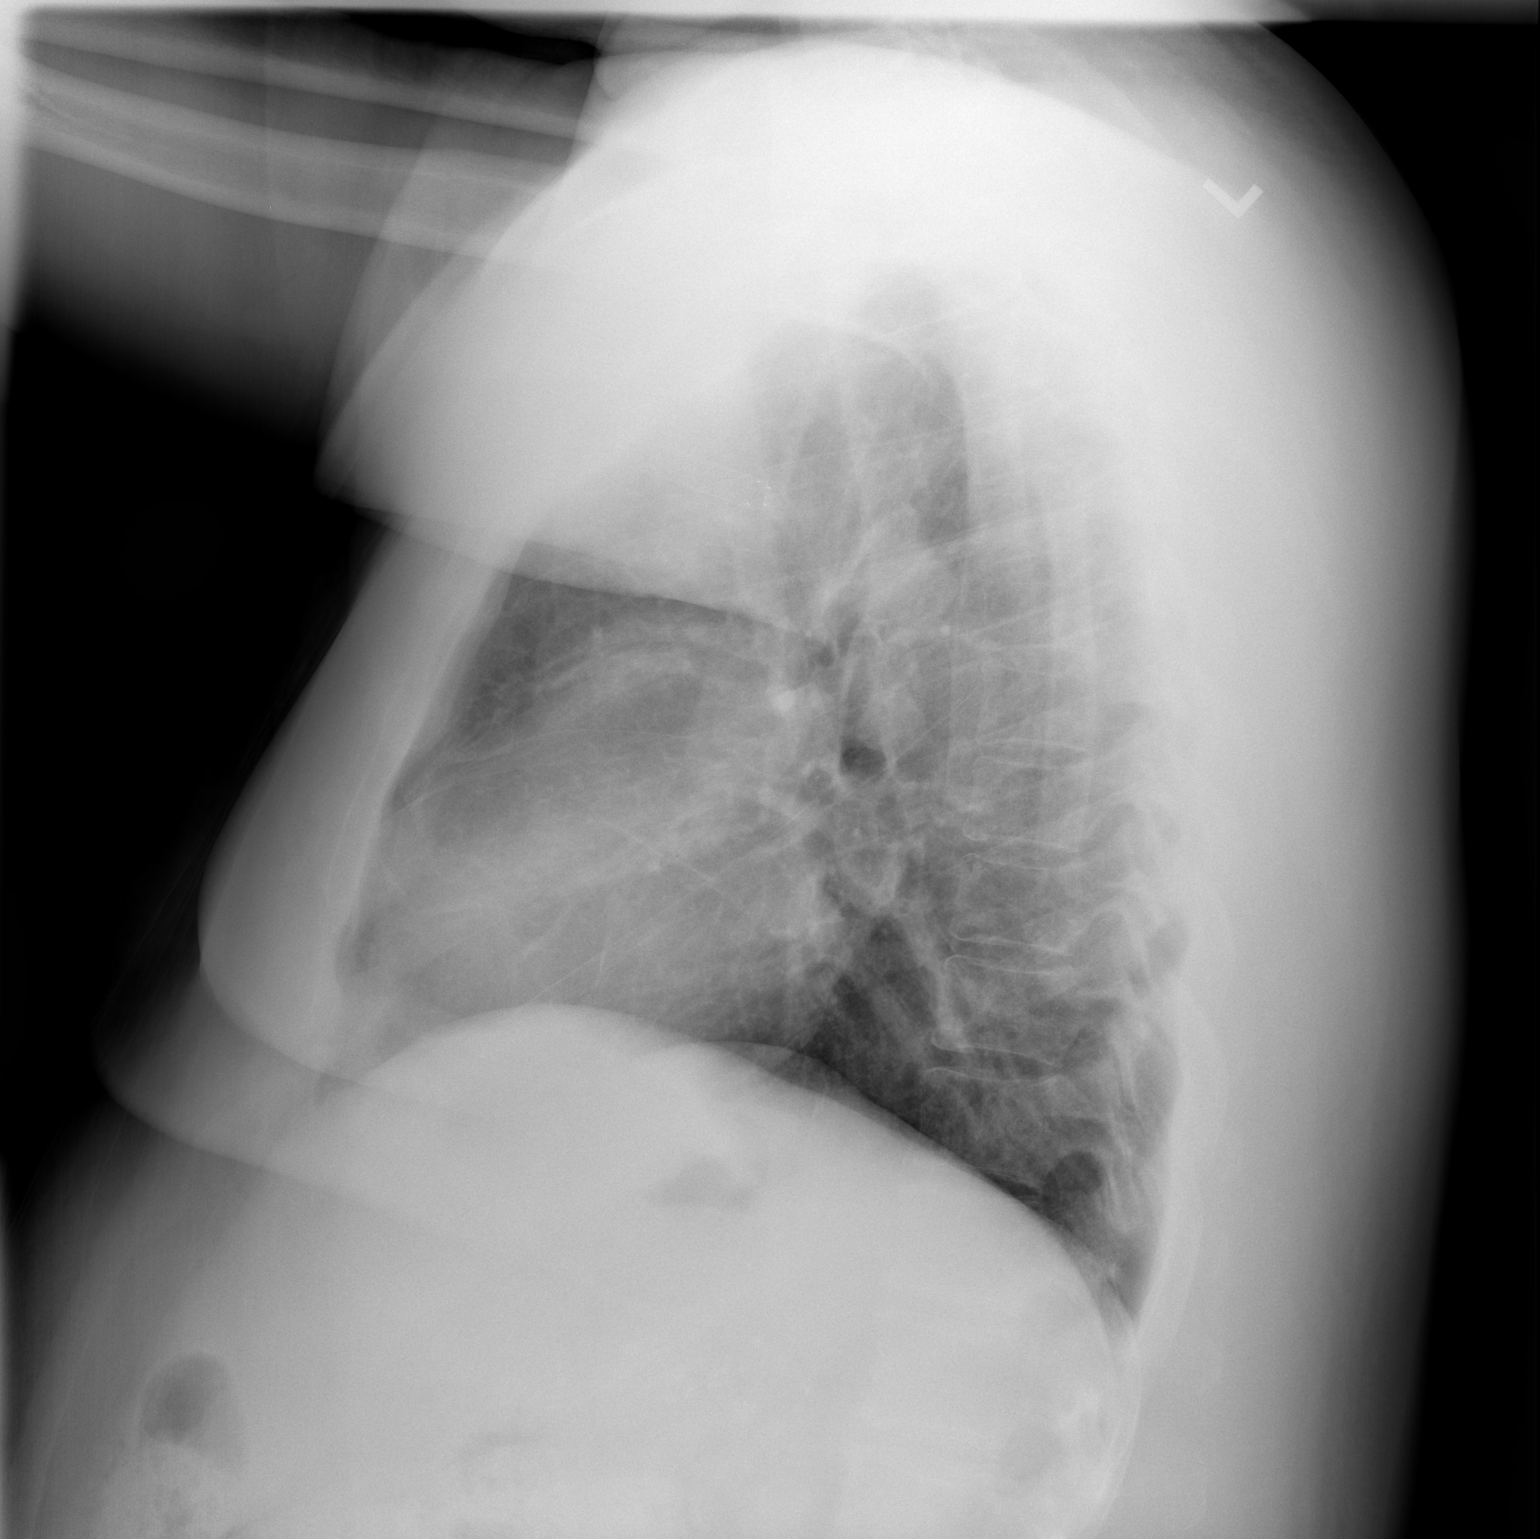

[2 of 2 positions shown; findings below may reference images not displayed]

FINDINGS: Frontal and lateral views of the chest demonstrate an unremarkable
cardiac silhouette. No airspace disease, effusion, or pneumothorax.
No evidence of healed or active tuberculosis.
IMPRESSION: 1. No acute intrathoracic process.
